# Patient Record
Sex: Female | Born: 1945 | Race: White | Hispanic: No | Marital: Single | Smoking: Former smoker
Health system: Southern US, Community
[De-identification: ages and names within clinical notes are randomized; demographics above are authoritative.]

## PROBLEM LIST (undated history)

## (undated) DIAGNOSIS — R112 Nausea with vomiting, unspecified: Secondary | ICD-10-CM

## (undated) DIAGNOSIS — F32A Depression, unspecified: Secondary | ICD-10-CM

## (undated) DIAGNOSIS — T8859XA Other complications of anesthesia, initial encounter: Secondary | ICD-10-CM

## (undated) DIAGNOSIS — F329 Major depressive disorder, single episode, unspecified: Secondary | ICD-10-CM

## (undated) DIAGNOSIS — I2699 Other pulmonary embolism without acute cor pulmonale: Secondary | ICD-10-CM

## (undated) DIAGNOSIS — E785 Hyperlipidemia, unspecified: Secondary | ICD-10-CM

## (undated) DIAGNOSIS — R011 Cardiac murmur, unspecified: Secondary | ICD-10-CM

## (undated) DIAGNOSIS — H409 Unspecified glaucoma: Secondary | ICD-10-CM

## (undated) DIAGNOSIS — I10 Essential (primary) hypertension: Secondary | ICD-10-CM

## (undated) DIAGNOSIS — I421 Obstructive hypertrophic cardiomyopathy: Secondary | ICD-10-CM

## (undated) DIAGNOSIS — T4145XA Adverse effect of unspecified anesthetic, initial encounter: Secondary | ICD-10-CM

## (undated) DIAGNOSIS — Z9889 Other specified postprocedural states: Secondary | ICD-10-CM

## (undated) HISTORY — PX: BACK SURGERY: SHX140

## (undated) HISTORY — PX: ABDOMINAL AORTIC ANEURYSM REPAIR: SUR1152

## (undated) HISTORY — DX: Essential (primary) hypertension: I10

## (undated) HISTORY — PX: NASAL SEPTUM SURGERY: SHX37

## (undated) HISTORY — PX: KNEE SURGERY: SHX244

## (undated) HISTORY — DX: Other pulmonary embolism without acute cor pulmonale: I26.99

## (undated) HISTORY — DX: Cardiac murmur, unspecified: R01.1

## (undated) HISTORY — DX: Depression, unspecified: F32.A

## (undated) HISTORY — PX: ANKLE SURGERY: SHX546

## (undated) HISTORY — DX: Hyperlipidemia, unspecified: E78.5

## (undated) HISTORY — DX: Unspecified glaucoma: H40.9

## (undated) HISTORY — DX: Major depressive disorder, single episode, unspecified: F32.9

## (undated) HISTORY — PX: DG THUMB LEFT HAND: HXRAD1658

## (undated) HISTORY — PX: CARDIAC CATHETERIZATION: SHX172

## (undated) HISTORY — DX: Obstructive hypertrophic cardiomyopathy: I42.1

---

## 1969-05-03 DIAGNOSIS — I2699 Other pulmonary embolism without acute cor pulmonale: Secondary | ICD-10-CM

## 1969-05-03 HISTORY — DX: Other pulmonary embolism without acute cor pulmonale: I26.99

## 1998-01-01 ENCOUNTER — Ambulatory Visit (HOSPITAL_COMMUNITY): Admission: RE | Admit: 1998-01-01 | Discharge: 1998-01-01 | Payer: Self-pay | Admitting: Cardiology

## 1998-03-06 ENCOUNTER — Other Ambulatory Visit: Admission: RE | Admit: 1998-03-06 | Discharge: 1998-03-06 | Payer: Self-pay | Admitting: *Deleted

## 1999-03-17 ENCOUNTER — Other Ambulatory Visit: Admission: RE | Admit: 1999-03-17 | Discharge: 1999-03-17 | Payer: Self-pay | Admitting: *Deleted

## 2000-04-19 ENCOUNTER — Other Ambulatory Visit: Admission: RE | Admit: 2000-04-19 | Discharge: 2000-04-19 | Payer: Self-pay | Admitting: Internal Medicine

## 2001-05-20 ENCOUNTER — Encounter: Payer: Self-pay | Admitting: Orthopedic Surgery

## 2001-05-20 ENCOUNTER — Encounter (INDEPENDENT_AMBULATORY_CARE_PROVIDER_SITE_OTHER): Payer: Self-pay | Admitting: Specialist

## 2001-05-20 ENCOUNTER — Observation Stay (HOSPITAL_COMMUNITY): Admission: RE | Admit: 2001-05-20 | Discharge: 2001-05-21 | Payer: Self-pay | Admitting: Orthopedic Surgery

## 2001-10-06 ENCOUNTER — Encounter (INDEPENDENT_AMBULATORY_CARE_PROVIDER_SITE_OTHER): Payer: Self-pay | Admitting: Specialist

## 2001-10-06 ENCOUNTER — Ambulatory Visit (HOSPITAL_COMMUNITY): Admission: RE | Admit: 2001-10-06 | Discharge: 2001-10-06 | Payer: Self-pay | Admitting: *Deleted

## 2002-09-17 ENCOUNTER — Encounter (INDEPENDENT_AMBULATORY_CARE_PROVIDER_SITE_OTHER): Payer: Self-pay

## 2002-09-17 ENCOUNTER — Ambulatory Visit (HOSPITAL_COMMUNITY): Admission: RE | Admit: 2002-09-17 | Discharge: 2002-09-17 | Payer: Self-pay | Admitting: *Deleted

## 2003-08-06 ENCOUNTER — Other Ambulatory Visit: Admission: RE | Admit: 2003-08-06 | Discharge: 2003-08-06 | Payer: Self-pay | Admitting: Internal Medicine

## 2004-09-24 ENCOUNTER — Emergency Department (HOSPITAL_COMMUNITY): Admission: EM | Admit: 2004-09-24 | Discharge: 2004-09-24 | Payer: Self-pay | Admitting: Emergency Medicine

## 2004-10-06 ENCOUNTER — Ambulatory Visit (HOSPITAL_COMMUNITY): Admission: RE | Admit: 2004-10-06 | Discharge: 2004-10-09 | Payer: Self-pay | Admitting: Orthopedic Surgery

## 2004-12-30 ENCOUNTER — Ambulatory Visit (HOSPITAL_BASED_OUTPATIENT_CLINIC_OR_DEPARTMENT_OTHER): Admission: RE | Admit: 2004-12-30 | Discharge: 2004-12-30 | Payer: Self-pay | Admitting: Orthopedic Surgery

## 2004-12-30 ENCOUNTER — Ambulatory Visit (HOSPITAL_COMMUNITY): Admission: RE | Admit: 2004-12-30 | Discharge: 2004-12-30 | Payer: Self-pay | Admitting: Orthopedic Surgery

## 2005-03-03 ENCOUNTER — Ambulatory Visit (HOSPITAL_COMMUNITY): Admission: RE | Admit: 2005-03-03 | Discharge: 2005-03-03 | Payer: Self-pay | Admitting: Orthopedic Surgery

## 2005-03-03 ENCOUNTER — Ambulatory Visit (HOSPITAL_BASED_OUTPATIENT_CLINIC_OR_DEPARTMENT_OTHER): Admission: RE | Admit: 2005-03-03 | Discharge: 2005-03-03 | Payer: Self-pay | Admitting: Orthopedic Surgery

## 2005-03-22 ENCOUNTER — Other Ambulatory Visit: Admission: RE | Admit: 2005-03-22 | Discharge: 2005-03-22 | Payer: Self-pay | Admitting: Internal Medicine

## 2005-09-22 ENCOUNTER — Ambulatory Visit: Payer: Self-pay | Admitting: Cardiology

## 2005-09-22 ENCOUNTER — Ambulatory Visit (HOSPITAL_COMMUNITY): Admission: EM | Admit: 2005-09-22 | Discharge: 2005-09-22 | Payer: Self-pay | Admitting: Emergency Medicine

## 2005-10-04 ENCOUNTER — Ambulatory Visit: Payer: Self-pay | Admitting: Cardiology

## 2006-03-22 ENCOUNTER — Encounter: Admission: RE | Admit: 2006-03-22 | Discharge: 2006-03-22 | Payer: Self-pay

## 2007-05-16 ENCOUNTER — Other Ambulatory Visit: Admission: RE | Admit: 2007-05-16 | Discharge: 2007-05-16 | Payer: Self-pay | Admitting: Family Medicine

## 2007-07-17 ENCOUNTER — Encounter (INDEPENDENT_AMBULATORY_CARE_PROVIDER_SITE_OTHER): Payer: Self-pay | Admitting: *Deleted

## 2007-07-17 ENCOUNTER — Ambulatory Visit (HOSPITAL_COMMUNITY): Admission: RE | Admit: 2007-07-17 | Discharge: 2007-07-17 | Payer: Self-pay | Admitting: *Deleted

## 2009-07-02 ENCOUNTER — Other Ambulatory Visit: Admission: RE | Admit: 2009-07-02 | Discharge: 2009-07-02 | Payer: Self-pay | Admitting: *Deleted

## 2009-07-02 ENCOUNTER — Encounter: Payer: Self-pay | Admitting: Cardiology

## 2010-03-13 ENCOUNTER — Encounter: Payer: Self-pay | Admitting: Cardiology

## 2010-03-17 ENCOUNTER — Ambulatory Visit: Payer: Self-pay | Admitting: Cardiology

## 2010-03-17 DIAGNOSIS — F329 Major depressive disorder, single episode, unspecified: Secondary | ICD-10-CM

## 2010-03-17 DIAGNOSIS — K921 Melena: Secondary | ICD-10-CM

## 2010-03-17 DIAGNOSIS — M25549 Pain in joints of unspecified hand: Secondary | ICD-10-CM

## 2010-03-17 DIAGNOSIS — E785 Hyperlipidemia, unspecified: Secondary | ICD-10-CM | POA: Insufficient documentation

## 2010-03-17 DIAGNOSIS — I10 Essential (primary) hypertension: Secondary | ICD-10-CM

## 2010-03-17 DIAGNOSIS — M5430 Sciatica, unspecified side: Secondary | ICD-10-CM

## 2010-03-17 DIAGNOSIS — H409 Unspecified glaucoma: Secondary | ICD-10-CM | POA: Insufficient documentation

## 2010-03-17 DIAGNOSIS — M549 Dorsalgia, unspecified: Secondary | ICD-10-CM | POA: Insufficient documentation

## 2010-03-17 DIAGNOSIS — H532 Diplopia: Secondary | ICD-10-CM

## 2010-03-17 DIAGNOSIS — I059 Rheumatic mitral valve disease, unspecified: Secondary | ICD-10-CM | POA: Insufficient documentation

## 2010-03-17 DIAGNOSIS — K649 Unspecified hemorrhoids: Secondary | ICD-10-CM | POA: Insufficient documentation

## 2010-03-17 DIAGNOSIS — E559 Vitamin D deficiency, unspecified: Secondary | ICD-10-CM | POA: Insufficient documentation

## 2010-03-17 DIAGNOSIS — R079 Chest pain, unspecified: Secondary | ICD-10-CM | POA: Insufficient documentation

## 2010-03-19 ENCOUNTER — Encounter (INDEPENDENT_AMBULATORY_CARE_PROVIDER_SITE_OTHER): Payer: Self-pay | Admitting: *Deleted

## 2010-03-19 LAB — CONVERTED CEMR LAB
Chloride: 100 meq/L (ref 96–112)
GFR calc non Af Amer: 59.71 mL/min (ref >60)
Glucose, Bld: 72 mg/dL (ref 70–99)

## 2010-04-06 ENCOUNTER — Ambulatory Visit (HOSPITAL_COMMUNITY)
Admission: RE | Admit: 2010-04-06 | Discharge: 2010-04-06 | Payer: Self-pay | Source: Home / Self Care | Admitting: Cardiovascular Disease

## 2010-04-23 ENCOUNTER — Ambulatory Visit: Payer: Self-pay

## 2010-04-23 ENCOUNTER — Encounter: Payer: Self-pay | Admitting: Cardiology

## 2010-04-23 ENCOUNTER — Ambulatory Visit (HOSPITAL_COMMUNITY)
Admission: RE | Admit: 2010-04-23 | Discharge: 2010-04-23 | Payer: Self-pay | Source: Home / Self Care | Attending: Cardiology | Admitting: Cardiology

## 2010-05-07 DIAGNOSIS — K7689 Other specified diseases of liver: Secondary | ICD-10-CM | POA: Insufficient documentation

## 2010-05-12 ENCOUNTER — Encounter
Admission: RE | Admit: 2010-05-12 | Discharge: 2010-05-12 | Payer: Self-pay | Source: Home / Self Care | Attending: Cardiology | Admitting: Cardiology

## 2010-06-04 NOTE — Miscellaneous (Signed)
Summary: Orders Update  Clinical Lists Changes  Orders: Added new Referral order of Cardiac CTA (Cardiac CTA) - Signed 

## 2010-06-04 NOTE — Consult Note (Signed)
Summary: [facility] Referral Request   [facility] Referral Request   Imported By: Roderic Ovens 04/09/2010 14:39:43  _____________________________________________________________________  External Attachment:    Type:   Image     Comment:   External Document

## 2010-06-04 NOTE — Assessment & Plan Note (Signed)
Summary: np6/ chestpain. abn ekg. pt has bcbs. gd   CC:  sob .  History of Present Illness: 65 year old female for evaluation of chest pain. Cardiac catheterization in 1990s and May of 2007 showed normal coronary arteries and normal LV function. Patient states she has had occasional chest pain for years. It is substernal and described as a tightness. It began as she starts to exert herself and as she continues it resolves. It does not radiate. There is no associated symptoms. She does not have these symptoms at rest. She otherwise denies orthopnea, PND or pedal edema. There is no syncope. Because of her chest pain are asked to further evaluate.  Current Medications (verified): 1)  Hydrochlorothiazide 25 Mg Tabs (Hydrochlorothiazide) .... Take One Tablet By Mouth Daily. 2)  Effexor Xr 75 Mg Xr24h-Cap (Venlafaxine Hcl) .Marland Kitchen.. 1  Tab By Mouth Once Daily 3)  Vitamin D 1000 Unit Tabs (Cholecalciferol) .Marland Kitchen.. 1  Tab By Mouth Once Daily 4)  Timolol Maleate 0.5 % Soln (Timolol Maleate) .... As Directed 5)  Xalatan 0.005 % Soln (Latanoprost) .... As Directed 6)  Fish Oil   Oil (Fish Oil) .Marland Kitchen.. 1 Tab By Mouth Once Daily 7)  Calcium Carbonate-Vitamin D 600-400 Mg-Unit Tabs (Calcium Carbonate-Vitamin D) .Marland Kitchen.. 1 Tab By Mouth Once Daily 8)  Claritin 10 Mg Tabs (Loratadine) .Marland Kitchen.. 1 Tab By Mouth Once Daily 9)  Triamcinolone Acetonide 0.1 % Crea (Triamcinolone Acetonide) .... As Directed 10)  Aspirin 81 Mg  Tabs (Aspirin) .Marland Kitchen.. 1  Tab By Mouth Once Daily 11)  Valtrex 1 Gm Tabs (Valacyclovir Hcl) .... As Needed 12)  Melatonin 1 Mg Caps (Melatonin) .Marland Kitchen.. 1  Tab By Mouth At Bedtime  Allergies: 1)  ! Augmentin  Past History:  Past Medical History: HYPERLIPIDEMIA HYPERTENSION  UNSPECIFIED VITAMIN D DEFICIENCY  GLUCOMA HEMORRHOIDS DEPRESSION  Prior Pulmonary embolus in 1971 following surgery  Past Surgical History: History of ankle surgery Prior right knee  Family History: Reviewed history from 03/17/2010  and no changes required. Father with CABG in his 56's  Social History: Reviewed history from 03/17/2010 and no changes required.  The patient quit tobacco in 1982.  She lives alone.  She is  not married and has no children.  She likes to play softball. Alcohol Use - yes Full Time  Review of Systems       no fevers or chills, productive cough, hemoptysis, dysphasia, odynophagia, melena, hematochezia, dysuria, hematuria, rash, seizure activity, orthopnea, PND, pedal edema, claudication. Remaining systems are negative.   Vital Signs:  Patient profile:   65 year old female Height:      67 inches Weight:      241 pounds BMI:     37.88 Pulse rate:   74 / minute Resp:     14 per minute BP sitting:   140 / 70  (left arm)  Vitals Entered By: Kem Parkinson (March 17, 2010 2:30 PM)  Physical Exam  General:  Well developed/well nourished in NAD Skin warm/dry Patient not depressed No peripheral clubbing Back-normal HEENT-normal/normal eyelids Neck supple/normal carotid upstroke bilaterally; no bruits; no JVD; no thyromegaly chest - CTA/ normal expansion CV - RRR/normal S1 and S2; no  rubs or gallops;  PMI nondisplaced; 1/6 systolic ejection murmur left sternal border. Abdomen -NT/ND, no HSM, no mass, + bowel sounds, no bruit 2+ femoral pulses, no bruits Ext-no edema, chords, 2+ DP Neuro-grossly nonfocal     EKG  Procedure date:  03/13/2010  Findings:      Sinus  bradycardia at a rate of 47. Diffuse T-wave inversion. Left ventricular hypertrophy.  Impression & Recommendations:  Problem # 1:  CHEST PAIN (ICD-786.50) Symptoms hernia based on description. However have also been present for several years. Previous cardiac catheterization showed normal coronary arteries. I will proceed with a functional study. If negative no further cardiac evaluation. Her updated medication list for this problem includes:    Aspirin 81 Mg Tabs (Aspirin) .Marland Kitchen... 1  tab by mouth once  daily  Problem # 2:  HYPERLIPIDEMIA (ICD-272.4) Management per primary care.  Problem # 3:  HYPERTENSION (ICD-401.9)  Blood pressure controlled on present medications. Will continue. Her updated medication list for this problem includes:    Hydrochlorothiazide 25 Mg Tabs (Hydrochlorothiazide) .Marland Kitchen... Take one tablet by mouth daily.    Aspirin 81 Mg Tabs (Aspirin) .Marland Kitchen... 1  tab by mouth once daily  Orders: TLB-BMP (Basic Metabolic Panel-BMET) (80048-METABOL)

## 2010-06-04 NOTE — Letter (Signed)
Summary: Susan Jenkins Physicians Office Visit Note   [facility] Physicians Office Visit Note   Imported By: Roderic Ovens 04/09/2010 14:34:24  _____________________________________________________________________  External Attachment:    Type:   Image     Comment:   External Document

## 2010-06-11 ENCOUNTER — Encounter: Payer: Self-pay | Admitting: Cardiology

## 2010-06-11 ENCOUNTER — Ambulatory Visit (INDEPENDENT_AMBULATORY_CARE_PROVIDER_SITE_OTHER): Payer: BC Managed Care – PPO | Admitting: Cardiology

## 2010-06-11 DIAGNOSIS — I422 Other hypertrophic cardiomyopathy: Secondary | ICD-10-CM | POA: Insufficient documentation

## 2010-06-11 DIAGNOSIS — I421 Obstructive hypertrophic cardiomyopathy: Secondary | ICD-10-CM

## 2010-06-18 NOTE — Assessment & Plan Note (Signed)
Summary: f6w/rescheduled from bmp list/dm   History of Present Illness: 65 year old female I saw in Nov 2011 for evaluation of chest pain. Cardiac catheterization in 1990s and May of 2007 showed normal coronary arteries and normal LV function. Patient states she has had occasional chest pain for years. Cardiac CT in December of 2011 revealed a calcium score of zero.  Normal right dominant coronary arteries. No significant lung or soft tissue findings. Abnormal LV apex.  Suggest correlation with echo or MRI. 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 Small pericardial effusion. Followup echocardiogram in December of 2011 revealed normal LV function, trace aortic insufficiency, and mild left atrial enlargement. Since I last saw her, the patient denies any dyspnea on exertion, orthopnea, PND, pedal edema, palpitations, syncope or chest pain.    Current Medications (verified): 1)  Hydrochlorothiazide 25 Mg Tabs (Hydrochlorothiazide) .... Take One Tablet By Mouth Daily. 2)  Effexor Xr 75 Mg Xr24h-Cap (Venlafaxine Hcl) .Marland Kitchen.. 1  Tab By Mouth Once Daily 3)  Vitamin D 1000 Unit Tabs (Cholecalciferol) .Marland Kitchen.. 1  Tab By Mouth Once Daily 4)  Timolol Maleate 0.5 % Soln (Timolol Maleate) .... As Directed 5)  Xalatan 0.005 % Soln (Latanoprost) .... As Directed 6)  Fish Oil   Oil (Fish Oil) .Marland Kitchen.. 1 Tab By Mouth Once Daily 7)  Calcium Carbonate-Vitamin D 600-400 Mg-Unit Tabs (Calcium Carbonate-Vitamin D) .Marland Kitchen.. 1 Tab By Mouth Once Daily 8)  Claritin 10 Mg Tabs (Loratadine) .Marland Kitchen.. 1 Tab By Mouth Once Daily 9)  Triamcinolone Acetonide 0.1 % Crea (Triamcinolone Acetonide) .... As Directed 10)  Aspirin 81 Mg  Tabs (Aspirin) .Marland Kitchen.. 1  Tab By Mouth Once Daily 11)  Valtrex 1 Gm Tabs (Valacyclovir Hcl) .... As Needed 12)  Melatonin 1 Mg Caps (Melatonin) .Marland Kitchen.. 1  Tab By Mouth At Bedtime  Allergies: 1)  ! 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  Past Surgical History: Reviewed history from 03/17/2010 and no changes required. History of ankle surgery Prior right knee  Social History: Reviewed history from 03/17/2010 and no changes required.  The patient quit tobacco in 1982.  She lives alone.  She is  not married and has no children.  She likes to play softball. Alcohol Use - yes Full Time  Review of Systems       no fevers or chills, productive cough, hemoptysis, dysphasia, odynophagia, melena, hematochezia, dysuria, hematuria, rash, seizure activity, orthopnea, PND, pedal edema, claudication. Remaining systems are negative.   Vital Signs:  Patient profile:   65 year old female Height:      67 inches Weight:      238 pounds BMI:     37.41 Pulse rate:   49 / minute Resp:     14 per minute BP sitting:   130 / 75  (left arm)  Vitals Entered By: Kem Parkinson (June 11, 2010 12:29 PM)  Physical Exam  General:  Well-developed well-nourished in no acute distress.  Skin is warm and dry.  HEENT is normal.  Neck is supple. No thyromegaly.  Chest is clear to auscultation with normal expansion.  Cardiovascular exam is regular rate and rhythm.  Abdominal exam nontender or distended. No masses palpated. Extremities show no edema. 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 However there are no symptoms including no dyspnea. She has no history of syncope. There is no family history of sudden death. I will arrange a 24-hour Holter monitor to exclude nonsustained ventricular tachycardia. I will not add any medications as she is asymptomatic.  She will have her siblings screened.  She has no children. Her updated medication list for this problem includes:    Hydrochlorothiazide 25 Mg Tabs (Hydrochlorothiazide) .Marland Kitchen... Take one tablet by mouth daily.    Aspirin 81 Mg Tabs (Aspirin) .Marland Kitchen... 1  tab by mouth once daily  Her updated medication list for this problem includes:    Hydrochlorothiazide 25 Mg Tabs (Hydrochlorothiazide) .Marland Kitchen... Take one tablet by mouth daily.    Aspirin 81 Mg Tabs (Aspirin) .Marland Kitchen... 1  tab by mouth once daily  Problem # 2:  CHEST PAIN (ICD-786.50) No evidence of obstructive coronary disease. Her updated medication list for this problem includes:    Aspirin 81 Mg Tabs (Aspirin) .Marland Kitchen... 1  tab by mouth once daily  Problem # 3:  HYPERTENSION (ICD-401.9) Blood pressure controlled. Continue present medications. Her updated medication list for this problem includes:    Hydrochlorothiazide 25 Mg Tabs (Hydrochlorothiazide) .Marland Kitchen... Take one tablet by mouth daily.    Aspirin 81 Mg Tabs (Aspirin) .Marland Kitchen... 1  tab by mouth once daily  Problem # 4:  HYPERLIPIDEMIA (ICD-272.4) Management per primary care.  Patient Instructions: 1)  Your physician wants you to follow-up in:  ONE YEAR You will receive a reminder letter in the mail two months in advance. If you don't receive a letter, please call our office to schedule the follow-up appointment. 2)  Your physician has recommended that you wear a holter monitor.  Holter monitors are medical devices that record the heart's electrical activity. Doctors most often use these monitors to diagnose arrhythmias. Arrhythmias are problems with the speed or rhythm of the heartbeat. The monitor is a small, portable device. You can wear one while you do your normal daily activities. This is usually used to diagnose what is causing palpitations/syncope (passing out).CALL ME TO SCHEDULE

## 2010-09-15 NOTE — Op Note (Signed)
NAMEBARBA, Susan Jenkins                ACCOUNT NO.:  [id number]   MEDICAL RECORD NO.:  [id number]          PATIENT TYPE:  AMB   LOCATION:  ENDO                         FACILITY:  [facility]   PHYSICIAN:  Georgiana Spinner, M.D.    DATE OF BIRTH:  Feb 22, 1946   DATE OF PROCEDURE:  07/17/2007  DATE OF DISCHARGE:                               OPERATIVE REPORT   PROCEDURE:  Endoscopy.   ANESTHESIA:  Fentanyl 50 mcg, Versed 5 mg.   INDICATIONS:  Gastroesophageal reflux disease.   PROCEDURE IN DETAIL:  With the patient mildly sedated in the left  lateral decubitus position the Pentax videoscopic endoscope was inserted  in the mouth and passed under direct vision through the esophagus into  the stomach.  The fundus, body, antrum, duodenal bulb, second portion of  duodenum were visualized.  From this point the endoscope was slowly  withdrawn taking circumferential views of duodenal mucosa until the  endoscope had been pulled back into the stomach, placed in retroflexion  to view the stomach from below.  The endoscope was straightened and  withdrawn taking circumferential views of remaining gastric and  esophageal mucosa stopping in the fundus where changes of thickening of  the mucosa were noted and photographed and biopsied to rule out  gastritis.  The endoscope was pulled back to the distal esophagus where  there was one area of possibly Barrett's esophagus not well seen but we  photographed this area and biopsied it.  The endoscope was withdrawn  taking circumferential views of remaining gastric and esophageal mucosa.  The patient's vital signs, pulse oximetry remained stable.  The patient  tolerated the procedure well without apparent complications.   FINDINGS:  Changes of gastritis, biopsied.  Question of Barrett's  esophagus, biopsied.  Await biopsy report.  The patient will call me for  results and follow up with me as an outpatient.           ______________________________  Georgiana Spinner,  M.D.     GMO/MEDQ  D:  07/17/2007  T:  07/17/2007  Job:  [id number]   cc:   Miss Fredrich Birks, NP  Deboraha Sprang

## 2010-09-15 NOTE — Op Note (Signed)
NAMEJENINE, Susan Jenkins                ACCOUNT NO.:  [id number]   MEDICAL RECORD NO.:  [id number]          PATIENT TYPE:  AMB   LOCATION:  ENDO                         FACILITY:  [facility]   PHYSICIAN:  Georgiana Spinner, M.D.    DATE OF BIRTH:  [birth year]   DATE OF PROCEDURE:  DATE OF DISCHARGE:                               OPERATIVE REPORT   ANESTHESIA:  Fentanyl 50 mcg, Versed 5 mg.   PROCEDURE:  With the patient mildly sedated in the left lateral  decubitus position the Pentax videoscopic colonoscope was inserted in  the rectum and passed under direct vision with pressure applied we were  able to reach the cecum identified by ileocecal valve and appendiceal  orifice both of which were photographed from this point the colonoscope  was slowly withdrawn taking circumferential views of colonic mucosa  stopping to photograph diverticulosis seen in the sigmoid colon to we  reached 20 cm from anal verge at which point a small polyp was seen  photographed and removed using hot biopsy forceps technique setting of  20/150 blended current in the rectum.  The endoscope was placed in  retroflexed view to view the anal canal from above the endoscope was  straightened and withdrawn the patient's vital signs, pulse oximeter  remained stable.  The patient tolerated procedure well without apparent  complication.   FINDINGS:  Internal hemorrhoids moderate diverticulosis of sigmoid colon  polyp at 20 cm from anal verge.  Plan await biopsy report.  The patient  will call me for results and follow-up with me as needed as an  outpatient.           ______________________________  Georgiana Spinner, M.D.     GMO/MEDQ  D:  07/17/2007  T:  07/17/2007  Job:  [id number]

## 2010-09-18 NOTE — Op Note (Signed)
[facility]  Patient:    Susan Jenkins, Susan Jenkins Visit Number: [id number] MRN: [id number]          Service Type: DSU Location: DAY Attending Physician:  Marin Comment Dictated by:   Pershing Cox, M.D. Proc. Date: 10/06/01 Admit Date:  10/06/2001   CC:         Darius Bump, M.D.   Operative Report  PREOPERATIVE DIAGNOSES: 1. Postmenopausal bleeding. 2. Abnormal sonogram.  POSTOPERATIVE DIAGNOSIS:  Endometrial polyps.  ANESTHESIA:  MAC plus Marcaine paracervical block.  SURGEON:  Pershing Cox, M.D.  INDICATIONS FOR PROCEDURE:  The patient is a 65 year old menopausal woman, who stopped having menstrual periods five years ago.  She has been spotting off and on for approximately five years.  She had a small cervical polyp noted by her family physician, Dr. Madison Hickman.  Given her history of extended bleeding, I recommended a sonogram which was performed on August 10, 2001. This showed thickened endometrium consistent with endometrial polyps.  The patient is brought to the operating room today for [facility] hysteroscopy and resection if necessary.  FINDINGS:  Exam under anesthesia shows the uterus to be small.  There are no adnexal masses noted.  The uterine cavity sounds to 7 cm.  There was a moderate amount of cervical stenosis which was overcome by dilators.  The canal was mostly atrophic.  There were two large polyps lying on the posterior wall of the uterus, arising from the lower uterine segment.  Both ostia were visualized.  DESCRIPTION OF PROCEDURE:  The patient, Susan Jenkins was brought to the operating room with an IV in place.  She received 1 g of Ancef in the holding area.  Supine on the OR table, IV sedation was administered, and then she was placed into Allen stirrups.  Exam under anesthesia was performed. She was then prepped with a solution of Hibiclens and draped with a sterile collecting drape beneath her  buttocks for a sterile vaginal procedure.  Bivalve speculum was inserted into the vagina.  Cervix was visualized, and Marcaine was injected into the anterior cervix which was grasped with a single-tooth tenaculum.  Marcaine paracervical block was then administered at the 3, 4, 7, and 8 positions using a total volume of 20 cc of this 0.25% solution.  The Kevorkian curette was used to obtain endocervical curettings.  Endocervical sound then passed into the endometrial cavity to stated distance of 7 cm. Serial Pratt dilators were then used to dilate the cervix to size 25.  The diagnostic scope was introduced, and photographs were taken to document the presence of these polyps.  The cervix was then further dilated with [facility] dilators to size 31.  It should be noted that cervical stenosis was significant and, in order to be able to complete the dilatation, it was necessary to place a second tenaculum on the posterior cervix.  Using slow, steady pressure, I was able to dilate to a size 33.  The resectoscope was then introduced, and we were able to complete the operation.  Using sorbitol through-and-through irrigation, the cavity was appropriately distended.  The resectoscope with a right angle wire was used to resect the polyps.  Our settings were 110-110 blend 1.  Both of the polyps were resected by carving through their base and then removing the fragments in toto.  The base of the polyps was resected and then cauterized.  The remainder of the cavity was irrigated and felt to be clean.  Small, sharp  curette was introduced into the uterine cavity, and endometrial curettings were scant.  These were submitted with the polyps.  Uterine sound was re-placed and sounded to only 7 cm.  The patient tolerated the procedure well and was taken to the recovery room in good condition. Dictated by:   Pershing Cox, M.D. Attending Physician:  Marin Comment DD:  10/06/01 TD:  10/07/01 Job:  [id number]

## 2010-09-18 NOTE — Op Note (Signed)
[facility]  Patient:    Susan Jenkins Visit Number: [id number] MRN: [id number]          Service Type: Attending:  Illene Labrador. Aplington, M.D. Dictated by:   Illene Labrador. Aplington, M.D. Proc. 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  OPERATION:  Microdiskectomy L4-L5 left with excision of herniated nucleus pulposus and removal of large free fragment of disk material.  SURGEON:  Illene Labrador. Aplington, M.D.  ASSISTANT:  Philips J. Montez Morita, M.D.  ANESTHESIA:  General.  INDICATIONS:  She was simply raking leaves in her yard on April 24, 2001, when she developed low back and left lower extremity pain.  This became progressively more severe with numbness and over the last few days some weakness and dorsiflexion of her left foot.  A lumbar MRI on May 13, 2001, demonstrated a large free fragment of disk material behind the body of L5 as well as HNP, and accordingly she is here for the above mentioned surgery. The risks and complications were discussed with her preoperatively.  DESCRIPTION OF PROCEDURE:  Prophylactic antibiotics, general anesthesia.  The patient was positioned on the [place] frame.  Back was prepped with DuraPrep and with three spinal needles and lateral x-ray, I tentatively localized the L4-L5 interspace.  I then continued draping the back in a sterile field, Ioban employed.  A vertical midline incision based on the original films.  The two spinous processes at the level which I thought were L4 and L5 were tacked with Kocher clamps and lateral x-rays confirmed that these Kochers were on the spinous process of L4 and L5.  Based on this, I then dissected the soft tissue off the lamina of L4 and L5 and placed a self  retaining retractor.  Then with a combination of 2 and 3 mm Kerrison rongeurs and removing a little bit of the inferior lamina of the lamina of L4 with the double action rongeur I gradually worked my way around the bony parameters at L4-L5 undermining the superior portion of L5 with a small curet for assistance.  I decompressed the dura and the L5 nerve root and removed ligament of flavum.  When we had enough working room I brought in the microscope and completed the lateral decompression.  The L5 nerve root was identified.  Lying beneath it was a large disk fragment and just superior and cephalad were two large veins.  I first gently dissected off the L5 nerve root from the underlying large disk fragment with a Penfield 4. I then removed it with pituitary rongeur.  This thoroughly decompressed the L5 nerve root.  A little additional fragments of nerve root were also removed from beneath the nerve with a nerve hook.  The two large veins were then coagulated with bipolar cautery.  They were overlying the L4-L5 disk which I then opened with a 15 knife blade and removed a large amount of disk material with a good bit of it centrally with a combination of straight and upbiting pituitaries.  At the conclusion of the case, all disk material had been removed from the interspace.  With both nerve hook and hockey stick, I was unable to find any  additional disk material beneath the dura or the L5 nerve root or the foramen, all of which appeared to be thoroughly decompressed.  The L3-L4 foramen was also patent to hockey stick.  The wound was then irrigated with sterile saline.  There was no unusual bleeding.  Gelfoam was placed over the inner space and around the L5 nerve root in the dura.  Self-retaining retractors were removed.  There is no unusual bleeding.  She was given 30 mg of Toradol IV.  The fascia was closed with interrupted #1 Vicryl, subcutaneous tissue with 2-0 Vicryl infiltrated with 0.5%  plain Marcaine.  The skin was closed with small staples.  Betadine, Adaptic and dry sterile dressing were applied.  She tolerated the procedure well.  She was gently placed on her bed. At the time of this dictation, she was on her way to recovery in satisfactory condition with no known complications. Dictated by:   Illene Labrador. Aplington, M.D. Attending:  Illene Labrador. Aplington, M.D. DD:  05/20/01 TD:  05/22/01 Job: [id number]

## 2010-09-18 NOTE — Op Note (Signed)
Susan Jenkins, Susan Jenkins                ACCOUNT NO.:  [id number]   MEDICAL RECORD NO.:  [id number]          PATIENT TYPE:  AMB   LOCATION:  NESC                         FACILITY:  [facility]   PHYSICIAN:  Marlowe Kays, M.D.  DATE OF BIRTH:  [birth year]   DATE OF PROCEDURE:  12/30/2004  DATE OF DISCHARGE:                                 OPERATIVE REPORT   PREOPERATIVE DIAGNOSES:  Status post repair of deltoid ligament, distal  fibular fracture, and placement of syndesmosis screw, left ankle.   POSTOPERATIVE DIAGNOSIS:  Status post repair of deltoid ligament, distal  fibular fracture, and placement of syndesmosis screw, left ankle.   OPERATIONS:  Screw transfer removing syndesmosis screw and replacement with  regular bone cortical screw in distal fibula.   SURGEON:  Marlowe Kays, M.D.   ASSISTANT:  Nurse.   ANESTHESIA:  General.   JUSTIFICATION FOR PROCEDURE:  She is now roughly 12 weeks post original  surgery and is time to let her begin weightbearing following the screw  exchange.   PROCEDURE:  Satisfied with general anesthesia with pneumatic tourniquet, the  leg was Esmarched out nonsterilely, prepped from mid-calf to toes with  DuraPrep and draped in a sterile field.  With a mini C-arm, I localize the  area of the syndesmosis screw and made a small incision there.  The screw  location was confirmed by placing the screwdriver in the screw head, and I  then removed the screw, measured the replacement screw at 20 mm, placed a  cortical screw, took the follow-up x-ray, and then closed following  irrigation  The subcutaneous tissue was closed with 3-0 Vicryl, the skin  with interrupted 4-0 nylon mattress sutures.  An Adaptic dry sterile  dressing was applied. The tourniquet was released. She tolerated the  procedure well and was taken to the recovery room in satisfactory condition  without no known complications. I also did infiltrate the soft tissues with  0.5% plain  Marcaine.           ______________________________  Marlowe Kays, M.D.     JA/MEDQ  D:  12/30/2004  T:  12/30/2004  Job:  [id number]

## 2010-09-18 NOTE — Op Note (Signed)
[name], [name]                ACCOUNT NO.:  [id number]   MEDICAL RECORD NO.:  [id number]          PATIENT TYPE:  AMB   LOCATION:  NESC                         FACILITY:  [facility]   PHYSICIAN:  Marlowe Kays, M.D.  DATE OF BIRTH:  [birth year]   DATE OF PROCEDURE:  03/03/2005  DATE OF DISCHARGE:                                 OPERATIVE REPORT   PREOPERATIVE DIAGNOSES:  Painful distal fibular plate, left ankle.   POSTOPERATIVE DIAGNOSES:  Painful distal fibular plate, left ankle.   OPERATION:  Removal of DCP distal left fibula.   SURGEON:  Marlowe Kays, M.D.   ASSISTANT:  Nurse.   ANESTHESIA:  General.   PATHOLOGY AND JUSTIFICATION FOR PROCEDURE:  She had had complex ankle  surgery. The fracture of the distal fibula is healed but she is having pain  which apparently is due to the plate extending down to the tip of the fibula  which I had to extend that far to cover the fracture site preoperatively. No  evidence for an infection, fracture is well-healed.   PROCEDURE:  Satisfactory general anesthesia, left lower extremity was  esmarched out nonsterilely and prepped from mid calf to toes with DuraPrep  and draped in a sterile field. I went through the old surgical incision. The  superficial cutaneous nerve was noted just superior and protected. The plate  was identified, once again no infection was noted. The six screws and plate  were removed and saved for the patient. The wound was irrigated with sterile  saline and the subcutaneous tissue infiltrated 1/2% Marcaine with  adrenaline. I then carefully closed the wound avoiding the sensory nerve  with interrupted 2-0 Vicryl deep, 3-0 Vicryl superficially in the  subcutaneous tissue and interrupted 4-0 nylon mattress sutures in the skin.  Betadine Adaptic dry sterile dressing were applied, tourniquet was released.  She tolerated the procedure well and was taken to the recovery room in  satisfactory condition with no known  complications.           ______________________________  Marlowe Kays, M.D.     JA/MEDQ  D:  03/03/2005  T:  03/04/2005  Job:  [id number]

## 2010-09-18 NOTE — Op Note (Signed)
   NAME:  Susan Jenkins, Susan Jenkins                          ACCOUNT NO.:  [id number]   MEDICAL RECORD NO.:  [id number]                   PATIENT TYPE:  AMB   LOCATION:  ENDO                                 FACILITY:  [facility]   PHYSICIAN:  Georgiana Spinner, M.D.                 DATE OF BIRTH:  Mar 29, 1946   DATE OF PROCEDURE:  09/17/2002  DATE OF DISCHARGE:                                 OPERATIVE REPORT   PROCEDURE:  Upper endoscopy.   INDICATIONS FOR PROCEDURE:  Gastroesophageal reflux disease.   ANESTHESIA:  Demerol 80, Versed 9 mg.   DESCRIPTION OF PROCEDURE:  With the patient mildly sedated in the left  lateral decubitus position, the Olympus videoscopic endoscope was inserted  in the mouth and passed under direct vision through the esophagus and there  was a questionable area of Barrett's esophagus seen, photographed and  biopsied as best we could. We entered into the stomach, fundus, body,  antrum, duodenal bulb and second portion of the duodenum all appeared  normal. From this point, the endoscope was slowly withdrawn taking  circumferential views of the duodenal mucosa until the endoscope was then  pulled back into the stomach, placed in retroflexion to view the stomach  from below. The endoscope was then straightened and withdrawn taking  circumferential views of the remaining gastric and esophageal mucosa. The  patient's vital signs and pulse oximeter remained stable. The patient  tolerated the procedure well without apparent complications.   FINDINGS:  Question of short segment Barrett's esophagus biopsied. Await  biopsy report. The patient will call me for results and followup with me as  an outpatient. Proceed to colonoscopy as planned.                                               Georgiana Spinner, M.D.    GMO/MEDQ  D:  09/17/2002  T:  09/17/2002  Job:  [id number]   cc:   Darius Bump, M.D.  [address]  [place] [address]  Fax: [phone]

## 2010-09-18 NOTE — Op Note (Signed)
   NAME:  Susan Jenkins, Susan Jenkins                          ACCOUNT NO.:  [id number]   MEDICAL RECORD NO.:  [id number]                   PATIENT TYPE:  AMB   LOCATION:  ENDO                                 FACILITY:  [facility]   PHYSICIAN:  Georgiana Spinner, M.D.                 DATE OF BIRTH:  10-Jan-1946   DATE OF PROCEDURE:  09/17/2002  DATE OF DISCHARGE:                                 OPERATIVE REPORT   PROCEDURE:  Colonoscopy.   INDICATIONS:  Rectal bleeding.   ANESTHESIA:  None further given.   DESCRIPTION OF PROCEDURE:  With the patient mildly sedated in the left  lateral decubitus position, the Olympus videoscopic colonoscope was inserted  in the rectum and passed under direct vision to the cecum, identified by the  crow's foot of the cecum and the ileocecal valve, both of which were  photographed.  From this point the colonoscope was slowly withdrawn, taking  circumferential views of the entire colonic mucosa, stopping only in the  rectum, which appeared normal on direct and showed hemorrhoids on  retroflexed view.  The endoscope was straightened and withdrawn.  The  patient's vital signs and pulse oximetry remained stable.  The patient  tolerated the procedure well without apparent complications.   FINDINGS:  Internal hemorrhoids, otherwise unremarkable colonoscopic  examination to the cecum.  This very well may account for the patient's  described hematochezia.   PLAN:  Have the patient follow up with me as an outpatient as described in  the endoscopy note.                                               Georgiana Spinner, M.D.    GMO/MEDQ  D:  09/17/2002  T:  09/17/2002  Job:  [id number]   cc:   Darius Bump, M.D.  [address]  [place] [address]  Fax: [phone]

## 2010-09-18 NOTE — H&P (Signed)
NAMEALBANA, Susan Jenkins NO.:  [id number]   MEDICAL RECORD NO.:  [id number]          PATIENT TYPE:  EMS   LOCATION:  MAJO                         FACILITY:  [facility]   PHYSICIAN:  Rollene Rotunda, M.D.   DATE OF BIRTH:  10/08/1945   DATE OF ADMISSION:  09/22/2005  DATE OF DISCHARGE:                                HISTORY & PHYSICAL   PRIMARY:  Dr. Madison Hickman   REASON FOR PRESENTATION:  Evaluate patient with chest pain.   HISTORY OF PRESENT ILLNESS:  The patient is a pleasant 65 year old white  female with a past history of a cardiac catheterization many years ago.  She  reports that to be normal.  She presented to her primary care doctor for  routine appointment but incidentally had been having chest discomfort.  It  has been going on for about 2-3 months.  The chest discomfort is left-sided.  It occurs routinely now with exertion or with emotional stress.  It is 6/10  in intensity.  She was not having this prior.  It is sharp.  It is  associated with shortness of breath.  It radiates down into her left arm.  When she gets it with walking she can sometimes walk through it.  However,  she notices that it is coming on with more frequency and with less activity  and with more intensity.  She had a severe episode this past Sunday.  In Dr.  Ermalene Searing office she was pain-free but noted to have an EKG with deep T-wave  inversions in the anterolateral leads.  This was much more pronounced than  on a previous EKG in 2006.   PAST MEDICAL HISTORY:  1.  Hyperlipidemia.  2.  Hypertension.   PAST SURGICAL HISTORY:  Ankle surgery.   ALLERGIES:  AUGMENTIN.   MEDICATIONS:  1.  Effexor 75 mg a day.  2.  Vytorin 10/40 one daily.   SOCIAL HISTORY:  The patient quit tobacco in 1982.  She lives alone.  She is  not married and has no children.  She likes to play softball.   FAMILY HISTORY:  Contributory for her father having early heart disease and  dying at age 64 of  myocardial infarction.   REVIEW OF SYSTEMS:  Positive for constipation, hemorrhoids.  Negative for  all other systems except as stated in the HPI.   PHYSICAL EXAMINATION:  GENERAL:  The patient is pleasant and in no distress.  VITAL SIGNS:  Afebrile, heart rate 54 and regular, blood pressure 152/79,  pulse oximetry 98% on room air.  HEENT:  Eyes unremarkable.  Pupils equal, round, and react to light.  Fundi  within normal limits. Oral mucosa unremarkable.  NECK:  No jugular venous distention.  Waveform within normal limits.  Carotid upstroke brisk and symmetric.  No bruits, no thyromegaly.  LYMPHATICS:  No cervical, axillary, or inguinal adenopathy.  LUNGS:  Clear to auscultation bilaterally.  BACK:  No costovertebral angle tenderness.  CHEST:  Unremarkable.  HEART:  PMI not displaced or sustained.  S1 and S2 within normal limits, no  S3, positive S4, no  murmurs.  ABDOMEN:  Mildly obese, positive bowel sounds normal in frequency and pitch.  No rebounds, rebound, guarding, midline pulsatile mass.  No hepatomegaly,  splenomegaly.  SKIN:  No rashes, no nodules.  EXTREMITIES:  Show 2+ pulses throughout.  No edema, cyanosis, or clubbing.  NEUROLOGIC:  Oriented to person, place and time.  Cranial nerves II-XII  grossly intact.  Motor grossly intact.   LABORATORY DATA:  Sodium 140, potassium 3.9, BUN 12, creatinine 0.9.  WBC  5.8, hemoglobin 12.4.   EKG:  Sinus rhythm, rate 55, axis within normal limits, intervals within  normal limits, lateral T-wave inversions consistent with ischemia versus  repolarization.   ASSESSMENT AND PLAN:  1.  Chest discomfort.  The patient's chest discomfort has some very      worrisome features for unstable angina.  She does have risk factors.      She has an abnormal EKG.  Given this, her pretest probability of      obstructive coronary disease is moderately high.  We discussed the      options of cardiac catheterization.  I think a false negative rate  is      too high in this situation.  I think the most definitive test in this      situation is a cardiac catheterization.  The patient has had one before.      She understands the procedure and understands the risks and benefits and      agrees to proceed.  2.  Hypertension.  Her EKG would suggest that this might be LVH with      repolarization.  She does have an S4.  She was taken off of      hydrochlorothiazide I believe in the past because of electrolyte      abnormalities.  I will take the liberty of staring an antihypertensive      here with probably a second-generation calcium channel blocker.  3.  Risk reduction.  She is having her lipids followed by Dr. Daphine Deutscher.  The      goal LDL will be based on presence or absence of obstructive coronary      disease.           ______________________________  Rollene Rotunda, M.D.     JH/MEDQ  D:  09/22/2005  T:  09/22/2005  Job:  [id number]   cc:   Darius Bump, M.D.  Fax: [phone]

## 2010-09-18 NOTE — Discharge Summary (Signed)
Susan Jenkins, Susan Jenkins                ACCOUNT NO.:  [id number]   MEDICAL RECORD NO.:  [id number]          PATIENT TYPE:  INP   LOCATION:  2807                         FACILITY:  [facility]   PHYSICIAN:  Rollene Rotunda, M.D.   DATE OF BIRTH:  1946/02/12   DATE OF ADMISSION:  09/22/2005  DATE OF DISCHARGE:  09/22/2005                                 DISCHARGE SUMMARY   PRIMARY CARDIOLOGIST:  Rollene Rotunda, M.D.   PRIMARY CARE PHYSICIAN:  Theatre stage manager at [facility].   PRINCIPAL DIAGNOSIS:  Chest pain.   OTHER DIAGNOSES:  1.  Hypertension.  2.  Hyperlipidemia.  3.  History of ankle surgery.  4.  Depression.   ALLERGIES:  AUGMENTIN.   PROCEDURES:  Left heart cardiac catheterization.   HISTORY OF PRESENT ILLNESS:  A 65 year old white female with no prior  history of CAD who reports 2-27-month history of exertional chest pain with  radiation down the left arm associated with shortness of breath and  diaphoresis which has been progressively worse in intensity and more  frequent.  She saw her primary care physician earlier today; and was advised  to [facility] ED for further evaluation.  In the ED, she had  ST-segment depression in the I, II, III, aVF and V3-V6.  A decision was made  to pursue cardiac catheterization.   HOSPITAL COURSE:  She underwent left heart cardiac catheterization, this  afternoon, which revealed normal coronary arteries with an EF of 65% and  normal wall motion.  She was hypertensive throughout the case with a  pressure of 151/94; and she has been initiated on Norvasc and amlodipine  therapy.  Post catheterization she has not had any additional discomfort.  She is being discharged home, today, in satisfactory condition.   DISCHARGE LABS:  Hemoglobin 12.4, hematocrit 35.7, WBC 5.8, platelets 219.  Sodium 140, potassium 3.9, chloride 109, BUN 12, creatinine 0.9, glucose 87.  CK 338, MB 3.8, troponin I 0.07.   DISPOSITION:  The patient is being  discharged home today in good condition.   FOLLOWUP APPOINTMENTS:  She has follow up appointment with Dr. Antoine Poche on  June 4 at 9:30 a.m.   DISCHARGE MEDICATIONS:  1.  Aspirin 81 mg daily.  2.  Effexor XR 75 mg daily.  3.  Vytorin 10/10 mg q.h.s.  4.  Norvasc 5 mg daily.   __________ discharge encounter 35 minutes including physician time.      Ok Anis, NP    ______________________________  Rollene Rotunda, M.D.    CRB/MEDQ  D:  09/22/2005  T:  09/23/2005  Job:  [id number]   cc:   [facility] at [facility]

## 2010-09-18 NOTE — Op Note (Signed)
Susan Jenkins, Susan Jenkins                ACCOUNT NO.:  [id number]   MEDICAL RECORD NO.:  [id number]          PATIENT TYPE:  OIB   LOCATION:  5005                         FACILITY:  [facility]   PHYSICIAN:  Marlowe Kays, M.D.  DATE OF BIRTH:  12-21-1945   DATE OF PROCEDURE:  10/06/2004  DATE OF DISCHARGE:                                 OPERATIVE REPORT   PREOPERATIVE DIAGNOSES:  1.  Closed fracture distal left fibula.  2.  Tear of deltoid ligament.  3.  Disruption of ankle mortise left ankle.   POSTOPERATIVE DIAGNOSES:  1.  Closed fracture distal left fibula.  2.  Tear of deltoid ligament.  3.  Disruption of ankle mortise left ankle.   OPERATION:  1.  Repair of deltoid ligament left ankle.  2.  Open reduction internal fixation distal left fibular fracture with 6-      hole DPC plate with syndesmosis screw and reconstitution of ankle      mortise.   SURGEON:  Marlowe Kays, M.D.   ASSISTANT:  Terie Purser, P.A.C.   ANESTHESIA:  General.   PATHOLOGY AND JUSTIFICATION FOR PROCEDURE:  Roughly 10 days ago she injured  her left ankle playing softball.  She was seen in the [facility] Emergency  Room and I saw her on October 02, 2004, with the above injuries.  I attempted  closed reduction and casting in my office but this was unsuccessful and,  consequently, she is here for the above-mentioned surgery.  __________  prophylactic antibiotics.  __________ general anesthesia, pneumatic  tourniquet, leg was esmarched out nonsterilely and prepped from mid calf to  toe tips with DuraPrep, draped in a sterile field.  I first made a curved  incision anterior to the medial malleolus and between the ankle and over the  anterior ankle joint. The deltoid ligament was exposed with blunt  dissection, it was completely shreaded and vaginated between the talus and  the medial malleolus.  I retrieved the flaps of deltoid ligament and cleared  some fibrin type material from the tibiotalar joint, irrigating  it well.  By  inverting the ankle, I was able to bring the talus adjacent to the medial  malleolus.  I then made an incision over the distal fibula.  The superficial  peroneal nerve was noted and protected.  The fracture site was exposed and  it was a long oblique fracture, requiring a fairly long incision to get  above and below the fracture site.  I reduced the fracture with a bougie  type clamp with the position checked with mini C-arm.  I then sized the  fracture.  A 6-hole plate was required to completely cover the fracture  pattern and felt a [facility] type plate was more substantial than the semitubular  and would be the plate of choice.  I also then used the C-arm to determine a  level for syndesmosis screw or screws.  Following this, I placed the plate  on the distal fibula and placed screws above and below the fracture site to  stabilize the plate and then, after checking on AP and  lateral x-rays,  continued the fixation plate, individually drilling, measuring and screwing  the plate.  I was only able to get one syndesmosis screw across the  syndesmosis because of the plate position, trying to hold the fracture, and  also putting the drill holes in the appropriate position.  This close the  medial ankle mortise down.  When the distal fibular plate had been secured,  I went back to the deltoid ligament and repaired it with interrupted 0  Vicryl with the ankle in slight inversion.  We then closed the medial ankle  wound with 3-0 Vicryl in subcutaneous tissue and interrupted 4-0 underlying  mattress sutures in the skin.  The lateral wound closed with interrupted 0  Vicryl in the periosteal facial complex, taking care once again to protect  the superficial peroneal nerve.  Subcutaneous tissue I closed with 3-0  Vicryl and the skin with running 4-0 nylon.  Betadine, Adaptic, dry sterile  dressing with a well padded short leg splint cast were applied.  Tourniquet  was released.  She tolerated  the procedure well and was taken to the  recovery room in satisfactory condition with no known complications.  Duplicated copies of final pictures were taken with one for the relatives  and family and one for my office, documenting good position of the fibular  plate, fibular fracture and anatomic restoration of the mortise.      JA/MEDQ  D:  10/06/2004  T:  10/06/2004  Job:  [id number]

## 2010-09-18 NOTE — Cardiovascular Report (Signed)
Susan Jenkins, Susan Jenkins                ACCOUNT NO.:  [id number]   MEDICAL RECORD NO.:  [id number]          PATIENT TYPE:  INP   LOCATION:  2807                         FACILITY:  [facility]   PHYSICIAN:  Rollene Rotunda, M.D.   DATE OF BIRTH:  [birth year]   DATE OF PROCEDURE:  09/22/2005  DATE OF DISCHARGE:                              CARDIAC CATHETERIZATION   PRIMARY CARE PHYSICIAN:  Darius Bump, M.D.   PROCEDURE:  Left heart catheterization/coronary angiography.   INDICATIONS:  The patient with chest pain suggestive of unstable angina.  She had an EKG with deep anterolateral Q wave inversions.  She had  cardiovascular risk factors.   DESCRIPTION OF PROCEDURE:  Left heart catheterization was performed via the  right femoral artery.  The artery was cannulated using anterior wall  puncture.  A 6-French arterial sheath and a 8-French was inserted via the  modified Seldinger technique.  Preformed Judkins and pigtail catheter were  utilized.  The patient tolerated the procedure well and left the lab in  stable condition.   RESULTS:  1.  Hemodynamics:  LV 152/23, AO 151/96,  2.  Coronaries:  Left main was normal.  The LAD was large wrapping the apex.      It was normal.  There was a first diagonal which was large, moderate      size and normal.  The second diagonal was large and normal.  The      circumflex was large.  It consisted predominantly of mid obtuse marginal      which was large and normal.  The right coronary artery was large,      dominant vessel.  The PDA was large and normal.  There were two small to      moderate size posterolaterals which were normal.   LEFT VENTRICULOGRAM:  The left ventriculogram was obtained in the RAO  projection.  The EF was 65% with normal wall motion.   CONCLUSIONS:  1.  Normal coronaries.  2.  Normal left ventricular function.   PLAN:  No further cardiac work-up is planned.  The patient will have medical  management for her hypertension which  most likely explains her EKG  abnormalities.  She will continue to follow with Dr. Daphine Deutscher for evaluation  of  non-anginal chest pain.  Of note, I think there is a remote possibility that  this could be coronary spasm.  There was no objective evidence of ischemia.  Therefore, a calcium channel blocker is reasonable for both management of  her hypertension as well as to possibly prevent spasm.           ______________________________  Rollene Rotunda, M.D.     JH/MEDQ  D:  09/22/2005  T:  09/23/2005  Job:  [id number]   cc:   Darius Bump, M.D.  Fax: [phone]

## 2011-06-03 ENCOUNTER — Encounter: Payer: Self-pay | Admitting: *Deleted

## 2011-06-08 ENCOUNTER — Encounter: Payer: Self-pay | Admitting: Cardiology

## 2011-06-08 ENCOUNTER — Ambulatory Visit (INDEPENDENT_AMBULATORY_CARE_PROVIDER_SITE_OTHER): Payer: BC Managed Care – PPO | Admitting: Cardiology

## 2011-06-08 VITALS — BP 140/64 | HR 55 | Ht 67.0 in | Wt 240.0 lb

## 2011-06-08 DIAGNOSIS — I10 Essential (primary) hypertension: Secondary | ICD-10-CM

## 2011-06-08 DIAGNOSIS — I421 Obstructive hypertrophic cardiomyopathy: Secondary | ICD-10-CM

## 2011-06-08 DIAGNOSIS — E785 Hyperlipidemia, unspecified: Secondary | ICD-10-CM

## 2011-06-08 NOTE — Patient Instructions (Signed)
Your physician wants you to follow-up in: ONE YEAR You will receive a reminder letter in the mail two months in advance. If you don't receive a letter, please call our office to schedule the follow-up appointment.   Your physician has recommended that you wear a 24 HOUR holter monitor. Holter monitors are medical devices that record the heart's electrical activity. Doctors most often use these monitors to diagnose arrhythmias. Arrhythmias are problems with the speed or rhythm of the heartbeat. The monitor is a small, portable device. You can wear one while you do your normal daily activities. This is usually used to diagnose what is causing palpitations/syncope (passing out).

## 2011-06-08 NOTE — Assessment & Plan Note (Signed)
Blood pressure controlled. Continue present medications. 

## 2011-06-08 NOTE — Progress Notes (Signed)
HPI: Pleasant female I saw in Nov 2011 for evaluation of chest pain. Cardiac catheterization in 1990s and May of 2007 showed normal coronary arteries and normal LV function. Patient states she has had occasional chest pain for years. Cardiac CT in December of 2011 revealed a calcium score of zero. Normal right dominant coronary arteries. No significant lung or soft tissue findings. Abnormal LV apex.  Suggest correlation with echo or MRI. 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 Small pericardial effusion. Followup echocardiogram in December of 2011 revealed normal LV function, trace aortic insufficiency, and mild left atrial enlargement. Since I last saw her in Feb 2012, the patient has dyspnea with more extreme activities but not with routine activities. It is relieved with rest. It is not associated with chest pain. There is no orthopnea, PND or pedal edema. There is no syncope or palpitations. There is no exertional chest pain.   Current Outpatient Prescriptions  Medication Sig Dispense Refill  . aspirin 81 MG tablet Take 81 mg by mouth daily.      . Calcium Carbonate-Vit D-Min 600-400 MG-UNIT TABS Take 1 tablet by mouth daily.      . hydrochlorothiazide (HYDRODIURIL) 25 MG tablet Take 25 mg by mouth daily.      Marland Kitchen latanoprost (XALATAN) 0.005 % ophthalmic solution Place 1 drop into both eyes at bedtime.      Marland Kitchen loratadine (CLARITIN) 10 MG tablet Take 10 mg by mouth daily.      . Melatonin 1 MG TABS Take 1 tablet by mouth at bedtime.      . Multiple Vitamin (MULTIVITAMIN) capsule Take 1 capsule by mouth daily.      . naproxen sodium (ANAPROX) 220 MG tablet Take 220 mg by mouth 2 (two) times daily with a meal.      . Omega-3 Fatty Acids (FISH OIL PO) Take 1 capsule by mouth daily.      Marland Kitchen pyridOXINE (B-6) 50 MG tablet Take 50 mg by mouth daily.      . timolol (TIMOPTIC) 0.5 % ophthalmic solution Place 1 drop into both eyes as directed.      . venlafaxine (EFFEXOR-XR)  75 MG 24 hr capsule Take 75 mg by mouth daily.      Marland Kitchen amLODipine (NORVASC) 10 MG tablet 10 mg Daily.      . CRESTOR 10 MG tablet 10 mg Daily.         Past Medical History  Diagnosis Date  . Hyperlipidemia   . Hypertension   . Vitamin d deficiency   . Hemorrhoids   . Depression   . Pulmonary embolus 1971    following surgery  . HOCM (hypertrophic obstructive cardiomyopathy)     Past Surgical History  Procedure Date  . Ankle surgery   . Knee surgery     History   Social History  . Marital Status: Single    Spouse Name: N/A    Number of Children: N/A  . Years of Education: N/A   Occupational History  . Not on file.   Social History Main Topics  . Smoking status: Former Smoker    Types: Cigarettes    Quit date: 05/03/1980  . Smokeless tobacco: Not on file  . Alcohol Use:   . Drug Use:   . Sexually Active:    Other Topics Concern  . Not on file   Social History Narrative  . No narrative on file    ROS: no fevers or chills, productive cough, hemoptysis, dysphasia,  odynophagia, melena, hematochezia, dysuria, hematuria, rash, seizure activity, orthopnea, PND, pedal edema, claudication. Remaining systems are negative.  Physical Exam: Well-developed well-nourished in no acute distress.  Skin is warm and dry.  HEENT is normal.  Neck is supple. No thyromegaly.  Chest is clear to auscultation with normal expansion.  Cardiovascular exam is regular rate and rhythm.  Abdominal exam nontender or distended. No masses palpated. Extremities show no edema. neuro grossly intact  ECG sinus bradycardia at a rate of 48. Left ventricular hypertrophy with repolarization abnormality.

## 2011-06-08 NOTE — Assessment & Plan Note (Signed)
The patient appears to have a possible variant of hypertrophic cardiomyopathy. However there are no symptoms including no significant dyspnea. She has no history of syncope. There is no family history of sudden death. We arranged a 24-hour Holter monitor to exclude nonsustained ventricular tachycardia previously but she did not have the study; we will reschedule. I will not add any medications as she is asymptomatic and is bradycardic.

## 2011-06-08 NOTE — Assessment & Plan Note (Signed)
Management per primary care. 

## 2011-06-11 ENCOUNTER — Ambulatory Visit: Payer: BC Managed Care – PPO | Admitting: Cardiology

## 2011-07-01 ENCOUNTER — Encounter (INDEPENDENT_AMBULATORY_CARE_PROVIDER_SITE_OTHER): Payer: BC Managed Care – PPO

## 2011-07-01 DIAGNOSIS — R0609 Other forms of dyspnea: Secondary | ICD-10-CM

## 2011-07-07 ENCOUNTER — Telehealth: Payer: Self-pay | Admitting: *Deleted

## 2011-07-07 NOTE — Telephone Encounter (Signed)
Spoke with pt, aware monitor reviewed by dr Jens Som shows sinus with occ PVC, isolated couplet. No NSVT.

## 2013-02-28 ENCOUNTER — Encounter: Payer: Self-pay | Admitting: Family

## 2013-02-28 ENCOUNTER — Ambulatory Visit (INDEPENDENT_AMBULATORY_CARE_PROVIDER_SITE_OTHER): Payer: BC Managed Care – PPO | Admitting: Family

## 2013-02-28 VITALS — BP 138/60 | HR 61 | Ht 66.5 in | Wt 235.0 lb

## 2013-02-28 DIAGNOSIS — F418 Other specified anxiety disorders: Secondary | ICD-10-CM

## 2013-02-28 DIAGNOSIS — E7800 Pure hypercholesterolemia, unspecified: Secondary | ICD-10-CM

## 2013-02-28 DIAGNOSIS — F341 Dysthymic disorder: Secondary | ICD-10-CM

## 2013-02-28 DIAGNOSIS — J309 Allergic rhinitis, unspecified: Secondary | ICD-10-CM

## 2013-02-28 DIAGNOSIS — I10 Essential (primary) hypertension: Secondary | ICD-10-CM

## 2013-02-28 LAB — LIPID PANEL
Cholesterol: 190 mg/dL (ref 0–200)
Total CHOL/HDL Ratio: 3
Triglycerides: 232 mg/dL — ABNORMAL HIGH (ref 0.0–149.0)
VLDL: 46.4 mg/dL — ABNORMAL HIGH (ref 0.0–40.0)

## 2013-02-28 LAB — BASIC METABOLIC PANEL
Chloride: 101 mEq/L (ref 96–112)
Creatinine, Ser: 0.9 mg/dL (ref 0.4–1.2)
Potassium: 4.1 mEq/L (ref 3.5–5.1)

## 2013-02-28 LAB — CBC WITH DIFFERENTIAL/PLATELET
Eosinophils Absolute: 0.2 10*3/uL (ref 0.0–0.7)
Hemoglobin: 14 g/dL (ref 12.0–15.0)
Lymphocytes Relative: 22.7 % (ref 12.0–46.0)
MCV: 97.6 fl (ref 78.0–100.0)
Monocytes Relative: 9.9 % (ref 3.0–12.0)
Neutrophils Relative %: 63.7 % (ref 43.0–77.0)
Platelets: 266 10*3/uL (ref 150.0–400.0)
RBC: 4.16 Mil/uL (ref 3.87–5.11)
RDW: 12.9 % (ref 11.5–14.6)
WBC: 7.3 10*3/uL (ref 4.5–10.5)

## 2013-02-28 LAB — HEPATIC FUNCTION PANEL
ALT: 23 U/L (ref 0–35)
Albumin: 4.4 g/dL (ref 3.5–5.2)
Total Bilirubin: 0.5 mg/dL (ref 0.3–1.2)

## 2013-02-28 NOTE — Progress Notes (Signed)
Subjective:    Patient ID: Susan Jenkins, female    DOB: Sep 06, 1945, 67 y.o.   MRN: [id number]  HPI 67 year old white female, a patient of the practices and to be established. She has a history of hypertension, hyperlipidemia, depression, mitral valve disorder, sciatica, insomnia. She's currently tolerating all of her medications well. She exercises daily and alternates between yoga, water aerobics, and senior softball. Last Pap approximately 6 months ago. She had flu shot 2 weeks ago. Mammogram is due in November and she wishes to schedule it. Last complete physical 2013.   Review of Systems  Constitutional: Negative.   HENT: Negative.   Respiratory: Negative.   Cardiovascular: Negative.   Gastrointestinal: Negative.   Endocrine: Negative.   Genitourinary: Negative.   Musculoskeletal: Negative.   Skin: Negative.   Neurological: Negative.   Hematological: Negative.   Psychiatric/Behavioral: Negative.    Past Medical History  Diagnosis Date  . Hyperlipidemia   . Hypertension   . Vitamin D deficiency   . Hemorrhoids   . Depression   . Pulmonary embolus 1971    following surgery  . HOCM (hypertrophic obstructive cardiomyopathy)     History   Social History  . Marital Status: Single    Spouse Name: N/A    Number of Children: N/A  . Years of Education: N/A   Occupational History  . Not on file.   Social History Main Topics  . Smoking status: Former Smoker    Types: Cigarettes    Quit date: 05/03/1980  . Smokeless tobacco: Not on file  . Alcohol Use:   . Drug Use:   . Sexual Activity:    Other Topics Concern  . Not on file   Social History Narrative  . No narrative on file    Past Surgical History  Procedure Laterality Date  . Ankle surgery    . Knee surgery      No family history on file.  Allergies  Allergen Reactions  . Amoxicillin-Pot Clavulanate     REACTION: anaphylaxis  . Amoxicillin-Pot Clavulanate     Current Outpatient Prescriptions on  File Prior to Visit  Medication Sig Dispense Refill  . amLODipine (NORVASC) 10 MG tablet 10 mg Daily.      Marland Kitchen aspirin 81 MG tablet Take 81 mg by mouth daily.      . Calcium Carbonate-Vit D-Min 600-400 MG-UNIT TABS Take 1 tablet by mouth daily.      . CRESTOR 10 MG tablet 10 mg Daily.      . hydrochlorothiazide (HYDRODIURIL) 25 MG tablet Take 25 mg by mouth daily.      Marland Kitchen latanoprost (XALATAN) 0.005 % ophthalmic solution Place 1 drop into both eyes at bedtime.      Marland Kitchen loratadine (CLARITIN) 10 MG tablet Take 10 mg by mouth daily.      . Melatonin 1 MG TABS Take 1 tablet by mouth at bedtime.      . Multiple Vitamin (MULTIVITAMIN) capsule Take 1 capsule by mouth daily.      . naproxen sodium (ANAPROX) 220 MG tablet Take 220 mg by mouth 2 (two) times daily with a meal.      . Omega-3 Fatty Acids (FISH OIL PO) Take 1 capsule by mouth daily.      Marland Kitchen pyridOXINE (B-6) 50 MG tablet Take 50 mg by mouth daily.      . timolol (TIMOPTIC) 0.5 % ophthalmic solution Place 1 drop into both eyes as directed.      . venlafaxine (  EFFEXOR-XR) 75 MG 24 hr capsule Take 75 mg by mouth daily.       No current facility-administered medications on file prior to visit.    BP 138/60  Pulse 61  Ht 5' 6.5" (1.689 m)  Wt 235 lb (106.595 kg)  BMI 37.37 kg/m2chart    Objective:   Physical Exam  Constitutional: She is oriented to person, place, and time. She appears well-developed and well-nourished.  HENT:  Right Ear: External ear normal.  Left Ear: External ear normal.  Nose: Nose normal.  Mouth/Throat: Oropharynx is clear and moist.  Neck: Normal range of motion. Neck supple.  Cardiovascular: Normal rate, regular rhythm and normal heart sounds.   Pulmonary/Chest: Effort normal and breath sounds normal.  Abdominal: Soft. Bowel sounds are normal. She exhibits no distension. There is no tenderness. There is no rebound.  Musculoskeletal: Normal range of motion.  Neurological: She is alert and oriented to person,  place, and time.  Skin: Skin is warm and dry.  Psychiatric: She has a normal mood and affect.          Assessment & Plan:  Assessment: 1. Hypertension 2. Hyperlipidemia 3. Depression 4. Mitral valve disorder 5. Insomnia  Plan: At patient's request, I spoke with Dr. Kirtland Bouchard. and she seen him in the past to have her switched to an. I had sent fasting labs to include lipids, CBC, BMP, LFTs as outpatient and the results. Recheck in 6 months and sooner as needed. Schedule mammogram.

## 2013-04-05 LAB — HM MAMMOGRAPHY

## 2013-04-14 ENCOUNTER — Encounter: Payer: Self-pay | Admitting: Family

## 2013-07-26 ENCOUNTER — Other Ambulatory Visit: Payer: Self-pay | Admitting: Internal Medicine

## 2013-08-29 ENCOUNTER — Ambulatory Visit (INDEPENDENT_AMBULATORY_CARE_PROVIDER_SITE_OTHER): Payer: Medicare HMO | Admitting: Internal Medicine

## 2013-08-29 ENCOUNTER — Encounter: Payer: Self-pay | Admitting: Internal Medicine

## 2013-08-29 ENCOUNTER — Telehealth: Payer: Self-pay | Admitting: Internal Medicine

## 2013-08-29 ENCOUNTER — Telehealth: Payer: Self-pay | Admitting: Family

## 2013-08-29 VITALS — BP 138/70 | HR 55 | Temp 98.3°F | Resp 20 | Ht 66.5 in | Wt 243.0 lb

## 2013-08-29 DIAGNOSIS — E785 Hyperlipidemia, unspecified: Secondary | ICD-10-CM

## 2013-08-29 DIAGNOSIS — Z800 Family history of malignant neoplasm of digestive organs: Secondary | ICD-10-CM

## 2013-08-29 DIAGNOSIS — I10 Essential (primary) hypertension: Secondary | ICD-10-CM

## 2013-08-29 NOTE — Progress Notes (Signed)
Subjective:    Patient ID: Susan Jenkins, female    DOB: 01-Aug-1945, 68 y.o.   MRN: [id number]  HPI  68 year old patient seen for her 6 month followup.  She has a history of hypertension, as well as dyslipidemia. Remains on Crestor 10 mg daily, which he tolerates well. She does have a history of chest pain and underwent a heart catheterization in 2007 that revealed normal coronary arteries. She is doing well today without concerns or complaints.  Past Medical History  Diagnosis Date  . Hyperlipidemia   . Hypertension   . Vitamin D deficiency   . Hemorrhoids   . Depression   . Pulmonary embolus 1971    following surgery  . HOCM (hypertrophic obstructive cardiomyopathy)     History   Social History  . Marital Status: Single    Spouse Name: N/A    Number of Children: N/A  . Years of Education: N/A   Occupational History  . Not on file.   Social History Main Topics  . Smoking status: Former Smoker    Types: Cigarettes    Quit date: 05/03/1980  . Smokeless tobacco: Not on file  . Alcohol Use:   . Drug Use:   . Sexual Activity:    Other Topics Concern  . Not on file   Social History Narrative  . No narrative on file    Past Surgical History  Procedure Laterality Date  . Ankle surgery    . Knee surgery      No family history on file.  Allergies  Allergen Reactions  . Amoxicillin-Pot Clavulanate     REACTION: anaphylaxis    Current Outpatient Prescriptions on File Prior to Visit  Medication Sig Dispense Refill  . amLODipine (NORVASC) 10 MG tablet TAKE 1 TABLET BY MOUTH ONCE DAILY  30 tablet  1  . aspirin 81 MG tablet Take 81 mg by mouth daily.      . Calcium Carbonate-Vit D-Min 600-400 MG-UNIT TABS Take 1 tablet by mouth daily.      . CRESTOR 10 MG tablet TAKE 1 TABLET BY MOUTH ONCE DAILY  30 tablet  1  . docusate sodium (COLACE) 100 MG capsule Take 100 mg by mouth 2 (two) times daily as needed for constipation.      . hydrochlorothiazide (HYDRODIURIL)  25 MG tablet TAKE 1 TABLET BY MOUTH EVERY DAY  30 tablet  0  . latanoprost (XALATAN) 0.005 % ophthalmic solution Place 1 drop into both eyes at bedtime.      Marland Kitchen loratadine (CLARITIN) 10 MG tablet Take 10 mg by mouth daily.      . Melatonin 1 MG TABS Take 1 tablet by mouth at bedtime.      . Multiple Vitamin (MULTIVITAMIN) capsule Take 1 capsule by mouth daily.      . naproxen sodium (ANAPROX) 220 MG tablet Take 220 mg by mouth 2 (two) times daily with a meal.      . Omega-3 Fatty Acids (FISH OIL PO) Take 1 capsule by mouth daily.      Marland Kitchen pyridOXINE (B-6) 50 MG tablet Take 50 mg by mouth daily.      . timolol (TIMOPTIC) 0.5 % ophthalmic solution Place 1 drop into both eyes as directed.      . Turmeric 500 MG CAPS Take by mouth.      . venlafaxine (EFFEXOR-XR) 75 MG 24 hr capsule Take 75 mg by mouth daily.       No current facility-administered medications  on file prior to visit.    BP 138/70  Pulse 55  Temp(Src) 98.3 F (36.8 C) (Oral)  Resp 20  Ht 5' 6.5" (1.689 m)  Wt 243 lb (110.224 kg)  BMI 38.64 kg/m2  SpO2 96%     Review of Systems  Constitutional: Negative.   HENT: Negative for congestion, dental problem, hearing loss, rhinorrhea, sinus pressure, sore throat and tinnitus.   Eyes: Negative for pain, discharge and visual disturbance.  Respiratory: Negative for cough and shortness of breath.   Cardiovascular: Negative for chest pain, palpitations and leg swelling.  Gastrointestinal: Negative for nausea, vomiting, abdominal pain, diarrhea, constipation, blood in stool and abdominal distention.  Genitourinary: Negative for dysuria, urgency, frequency, hematuria, flank pain, vaginal bleeding, vaginal discharge, difficulty urinating, vaginal pain and pelvic pain.  Musculoskeletal: Negative for arthralgias, gait problem and joint swelling.  Skin: Negative for rash.  Neurological: Negative for dizziness, syncope, speech difficulty, weakness, numbness and headaches.  Hematological:  Negative for adenopathy.  Psychiatric/Behavioral: Negative for behavioral problems, dysphoric mood and agitation. The patient is not nervous/anxious.        Objective:   Physical Exam  Constitutional: She is oriented to person, place, and time. She appears well-developed and well-nourished.  HENT:  Head: Normocephalic.  Right Ear: External ear normal.  Left Ear: External ear normal.  Mouth/Throat: Oropharynx is clear and moist.  Eyes: Conjunctivae and EOM are normal. Pupils are equal, round, and reactive to light.  Neck: Normal range of motion. Neck supple. No thyromegaly present.  Cardiovascular: Normal rate, regular rhythm, normal heart sounds and intact distal pulses.   Pulmonary/Chest: Effort normal and breath sounds normal.  Abdominal: Soft. Bowel sounds are normal. She exhibits no mass. There is no tenderness.  Musculoskeletal: Normal range of motion.  Lymphadenopathy:    She has no cervical adenopathy.  Neurological: She is alert and oriented to person, place, and time.  Skin: Skin is warm and dry. No rash noted.  Psychiatric: She has a normal mood and affect. Her behavior is normal.          Assessment & Plan:   Hypertension well controlled Dyslipidemia.  Continue Crestor.  Samples provided  Recheck 6 months for annual exam

## 2013-08-29 NOTE — Telephone Encounter (Signed)
Susan Jenkins per him to schedule pt for her physical she is an old pt of his.

## 2013-08-29 NOTE — Progress Notes (Signed)
Pre-visit discussion using our clinic review tool. No additional management support is needed unless otherwise documented below in the visit note.  

## 2013-08-29 NOTE — Telephone Encounter (Signed)
Pt would like to switch to dr Burnice Logan, Pt did not give a reason

## 2013-08-29 NOTE — Patient Instructions (Signed)
Limit your sodium (Salt) intake  Please check your blood pressure on a regular basis.  If it is consistently greater than 150/90, please make an office appointment.    It is important that you exercise regularly, at least 20 minutes 3 to 4 times per week.  If you develop chest pain or shortness of breath seek  medical attention.  Return in 6 months for follow-up  

## 2013-08-29 NOTE — Telephone Encounter (Signed)
Pt has been sch for dr Raliegh Ip and pcp banner has been changed

## 2013-08-29 NOTE — Telephone Encounter (Signed)
Relevant patient education mailed to patient.  

## 2013-09-11 ENCOUNTER — Other Ambulatory Visit: Payer: Self-pay | Admitting: Internal Medicine

## 2013-09-25 ENCOUNTER — Other Ambulatory Visit: Payer: Self-pay | Admitting: Internal Medicine

## 2013-10-01 ENCOUNTER — Telehealth: Payer: Self-pay | Admitting: Internal Medicine

## 2013-10-01 ENCOUNTER — Encounter: Payer: Self-pay | Admitting: Internal Medicine

## 2013-10-01 ENCOUNTER — Ambulatory Visit (INDEPENDENT_AMBULATORY_CARE_PROVIDER_SITE_OTHER): Payer: Medicare HMO | Admitting: Internal Medicine

## 2013-10-01 VITALS — BP 146/78 | HR 56 | Temp 98.4°F | Resp 20 | Ht 66.5 in | Wt 242.0 lb

## 2013-10-01 DIAGNOSIS — I10 Essential (primary) hypertension: Secondary | ICD-10-CM

## 2013-10-01 DIAGNOSIS — E785 Hyperlipidemia, unspecified: Secondary | ICD-10-CM

## 2013-10-01 DIAGNOSIS — H8110 Benign paroxysmal vertigo, unspecified ear: Secondary | ICD-10-CM

## 2013-10-01 NOTE — Patient Instructions (Addendum)
Benign Positional Vertigo Vertigo means you feel like you or your surroundings are moving when they are not. Benign positional vertigo is the most common form of vertigo. Benign means that the cause of your condition is not serious. Benign positional vertigo is more common in older adults. CAUSES  Benign positional vertigo is the result of an upset in the labyrinth system. This is an area in the middle ear that helps control your balance. This may be caused by a viral infection, head injury, or repetitive motion. However, often no specific cause is found. SYMPTOMS  Symptoms of benign positional vertigo occur when you move your head or eyes in different directions. Some of the symptoms may include:  Loss of balance and falls.  Vomiting.  Blurred vision.  Dizziness.  Nausea.  Involuntary eye movements (nystagmus). DIAGNOSIS  Benign positional vertigo is usually diagnosed by physical exam. If the specific cause of your benign positional vertigo is unknown, your caregiver may perform imaging tests, such as magnetic resonance imaging (MRI) or computed tomography (CT). TREATMENT  Your caregiver may recommend movements or procedures to correct the benign positional vertigo. Medicines such as meclizine, benzodiazepines, and medicines for nausea may be used to treat your symptoms. In rare cases, if your symptoms are caused by certain conditions that affect the inner ear, you may need surgery. HOME CARE INSTRUCTIONS   Follow your caregiver's instructions.  Move slowly. Do not make sudden body or head movements.  Avoid driving.  Avoid operating heavy machinery.  Avoid performing any tasks that would be dangerous to you or others during a vertigo episode.  Drink enough fluids to keep your urine clear or pale yellow. SEEK IMMEDIATE MEDICAL CARE IF:   You develop problems with walking, weakness, numbness, or using your arms, hands, or legs.  You have difficulty speaking.  You develop  severe headaches.  Your nausea or vomiting continues or gets worse.  You develop visual changes.  Your family or friends notice any behavioral changes.  Your condition gets worse.  You have a fever.  You develop a stiff neck or sensitivity to light. MAKE SURE YOU:   Understand these instructions.  Will watch your condition.  Will get help right away if you are not doing well or get worse. Document Released: 01/25/2006 Document Revised: 07/12/2011 Document Reviewed: 01/07/2011 [facility] Patient Information 2014 [place].   Call or return to clinic prn if these symptoms worsen or fail to improve as anticipated.

## 2013-10-01 NOTE — Telephone Encounter (Signed)
Relevant patient education mailed to patient.  

## 2013-10-01 NOTE — Progress Notes (Signed)
Pre-visit discussion using our clinic review tool. No additional management support is needed unless otherwise documented below in the visit note.  

## 2013-10-01 NOTE — Progress Notes (Signed)
Subjective:    Patient ID: Susan Jenkins, female    DOB: June 22, 1945, 68 y.o.   MRN: [id number]  HPI  68 year old patient who has a history of treated hypertension.  The past 6 days.  She has had vertigo.  This is worse at night when she is in a supine position, and she changes positions.  There has been some occasional unsteadiness throughout the day.  She describes true vertigo with spinning and some nausea. She has been using antihistamines with some benefit.  Symptoms seem to be improving.  Past Medical History  Diagnosis Date  . Hyperlipidemia   . Hypertension   . Vitamin D deficiency   . Hemorrhoids   . Depression   . Pulmonary embolus 1971    following surgery  . HOCM (hypertrophic obstructive cardiomyopathy)     History   Social History  . Marital Status: Single    Spouse Name: N/A    Number of Children: N/A  . Years of Education: N/A   Occupational History  . Not on file.   Social History Main Topics  . Smoking status: Former Smoker    Types: Cigarettes    Quit date: 05/03/1980  . Smokeless tobacco: Not on file  . Alcohol Use:   . Drug Use:   . Sexual Activity:    Other Topics Concern  . Not on file   Social History Narrative  . No narrative on file    Past Surgical History  Procedure Laterality Date  . Ankle surgery    . Knee surgery      No family history on file.  Allergies  Allergen Reactions  . Amoxicillin-Pot Clavulanate     REACTION: anaphylaxis    Current Outpatient Prescriptions on File Prior to Visit  Medication Sig Dispense Refill  . amLODipine (NORVASC) 10 MG tablet TAKE 1 TABLET BY MOUTH ONCE DAILY  30 tablet  5  . aspirin 81 MG tablet Take 81 mg by mouth daily.      . Calcium Carbonate-Vit D-Min 600-400 MG-UNIT TABS Take 1 tablet by mouth daily.      . CRESTOR 10 MG tablet TAKE 1 TABLET BY MOUTH ONCE DAILY  30 tablet  5  . docusate sodium (COLACE) 100 MG capsule Take 100 mg by mouth 2 (two) times daily as needed for  constipation.      . hydrochlorothiazide (HYDRODIURIL) 25 MG tablet TAKE 1 TABLET BY MOUTH EVERY DAY  30 tablet  5  . latanoprost (XALATAN) 0.005 % ophthalmic solution Place 1 drop into both eyes at bedtime.      Marland Kitchen loratadine (CLARITIN) 10 MG tablet Take 10 mg by mouth daily.      . Melatonin 1 MG TABS Take 1 tablet by mouth at bedtime.      . Multiple Vitamin (MULTIVITAMIN) capsule Take 1 capsule by mouth daily.      . naproxen sodium (ANAPROX) 220 MG tablet Take 220 mg by mouth 2 (two) times daily with a meal.      . Omega-3 Fatty Acids (FISH OIL PO) Take 1 capsule by mouth daily.      Marland Kitchen pyridOXINE (B-6) 50 MG tablet Take 50 mg by mouth daily.      . timolol (TIMOPTIC) 0.5 % ophthalmic solution Place 1 drop into both eyes as directed.      . Turmeric 500 MG CAPS Take by mouth.      . venlafaxine (EFFEXOR-XR) 75 MG 24 hr capsule Take 75 mg by  mouth daily.       No current facility-administered medications on file prior to visit.    BP 146/78  Pulse 56  Temp(Src) 98.4 F (36.9 C) (Oral)  Resp 20  Ht 5' 6.5" (1.689 m)  Wt 242 lb (109.77 kg)  BMI 38.48 kg/m2  SpO2 97%       Review of Systems  Constitutional: Negative.   HENT: Negative for congestion, dental problem, hearing loss, rhinorrhea, sinus pressure, sore throat and tinnitus.   Eyes: Negative for pain, discharge and visual disturbance.  Respiratory: Negative for cough and shortness of breath.   Cardiovascular: Negative for chest pain, palpitations and leg swelling.  Gastrointestinal: Positive for nausea. Negative for vomiting, abdominal pain, diarrhea, constipation, blood in stool and abdominal distention.  Genitourinary: Negative for dysuria, urgency, frequency, hematuria, flank pain, vaginal bleeding, vaginal discharge, difficulty urinating, vaginal pain and pelvic pain.  Musculoskeletal: Negative for arthralgias, gait problem and joint swelling.  Skin: Negative for rash.  Neurological: Positive for dizziness and  light-headedness. Negative for syncope, speech difficulty, weakness, numbness and headaches.  Hematological: Negative for adenopathy.  Psychiatric/Behavioral: Negative for behavioral problems, dysphoric mood and agitation. The patient is not nervous/anxious.        Objective:   Physical Exam  Constitutional: She is oriented to person, place, and time. She appears well-developed and well-nourished.  Blood pressure 130/70  HENT:  Head: Normocephalic.  Right Ear: External ear normal.  Left Ear: External ear normal.  Mouth/Throat: Oropharynx is clear and moist.  Eyes: Conjunctivae and EOM are normal. Pupils are equal, round, and reactive to light.  Neck: Normal range of motion. Neck supple. No thyromegaly present.  Cardiovascular: Normal rate, regular rhythm, normal heart sounds and intact distal pulses.   Pulmonary/Chest: Effort normal and breath sounds normal.  Abdominal: Soft. Bowel sounds are normal. She exhibits no mass. There is no tenderness.  Musculoskeletal: Normal range of motion.  Lymphadenopathy:    She has no cervical adenopathy.  Neurological: She is alert and oriented to person, place, and time. No cranial nerve deficit.  Normal finger to nose testing  Skin: Skin is warm and dry. No rash noted.  Psychiatric: She has a normal mood and affect. Her behavior is normal.          Assessment & Plan:   Benign positional vertigo.  We'll continue antihistamines.  Will give instructions on repositioning exercises Hypertension stable

## 2013-10-10 ENCOUNTER — Telehealth: Payer: Self-pay | Admitting: Internal Medicine

## 2013-10-10 MED ORDER — HYDROCHLOROTHIAZIDE 25 MG PO TABS: ORAL_TABLET | ORAL | Status: DC

## 2013-10-10 NOTE — Telephone Encounter (Signed)
Rx sent 

## 2013-10-10 NOTE — Telephone Encounter (Signed)
[id number] - [place], [place] - [address] AT [address] is requesting 90 day re-fill on hydrochlorothiazide (HYDRODIURIL) 25 MG tablet

## 2013-10-30 ENCOUNTER — Other Ambulatory Visit: Payer: Self-pay | Admitting: Internal Medicine

## 2014-02-05 ENCOUNTER — Other Ambulatory Visit: Payer: Self-pay | Admitting: *Deleted

## 2014-02-05 MED ORDER — ROSUVASTATIN CALCIUM 10 MG PO TABS: ORAL_TABLET | ORAL | Status: DC

## 2014-02-27 ENCOUNTER — Other Ambulatory Visit (INDEPENDENT_AMBULATORY_CARE_PROVIDER_SITE_OTHER): Payer: Medicare HMO

## 2014-02-27 DIAGNOSIS — E785 Hyperlipidemia, unspecified: Secondary | ICD-10-CM

## 2014-02-27 DIAGNOSIS — Z0000 Encounter for general adult medical examination without abnormal findings: Secondary | ICD-10-CM

## 2014-02-27 DIAGNOSIS — I10 Essential (primary) hypertension: Secondary | ICD-10-CM

## 2014-02-27 LAB — CBC WITH DIFFERENTIAL/PLATELET
Basophils Absolute: 0.1 10*3/uL (ref 0.0–0.1)
Basophils Relative: 0.8 % (ref 0.0–3.0)
EOS PCT: 3.1 % (ref 0.0–5.0)
Eosinophils Absolute: 0.2 10*3/uL (ref 0.0–0.7)
HEMATOCRIT: 40.8 % (ref 36.0–46.0)
Hemoglobin: 13.6 g/dL (ref 12.0–15.0)
LYMPHS ABS: 2 10*3/uL (ref 0.7–4.0)
LYMPHS PCT: 29.9 % (ref 12.0–46.0)
MCHC: 33.3 g/dL (ref 30.0–36.0)
MCV: 100.6 fl — ABNORMAL HIGH (ref 78.0–100.0)
MONOS PCT: 10.2 % (ref 3.0–12.0)
Monocytes Absolute: 0.7 10*3/uL (ref 0.1–1.0)
Neutro Abs: 3.7 10*3/uL (ref 1.4–7.7)
Neutrophils Relative %: 56 % (ref 43.0–77.0)
PLATELETS: 248 10*3/uL (ref 150.0–400.0)
RBC: 4.06 Mil/uL (ref 3.87–5.11)
RDW: 13.6 % (ref 11.5–15.5)
WBC: 6.6 10*3/uL (ref 4.0–10.5)

## 2014-02-27 LAB — POCT URINALYSIS DIPSTICK
Bilirubin, UA: NEGATIVE
Glucose, UA: NEGATIVE
Ketones, UA: NEGATIVE
Nitrite, UA: NEGATIVE
Protein, UA: NEGATIVE
Spec Grav, UA: 1.01
Urobilinogen, UA: 0.2
pH, UA: 7

## 2014-02-27 LAB — LIPID PANEL
Cholesterol: 174 mg/dL (ref 0–200)
HDL: 70.9 mg/dL (ref >39.00)
LDL CALC: 78 mg/dL (ref 0–99)
NonHDL: 103.1
Total CHOL/HDL Ratio: 2
Triglycerides: 125 mg/dL (ref 0.0–149.0)
VLDL: 25 mg/dL (ref 0.0–40.0)

## 2014-02-27 LAB — BASIC METABOLIC PANEL WITH GFR
BUN: 17 mg/dL (ref 6–23)
CO2: 21 meq/L (ref 19–32)
Calcium: 9.8 mg/dL (ref 8.4–10.5)
Chloride: 104 meq/L (ref 96–112)
Creatinine, Ser: 1.1 mg/dL (ref 0.4–1.2)
GFR: 54.54 mL/min — ABNORMAL LOW
Glucose, Bld: 98 mg/dL (ref 70–99)
Potassium: 4.7 meq/L (ref 3.5–5.1)
Sodium: 142 meq/L (ref 135–145)

## 2014-02-27 LAB — HEPATIC FUNCTION PANEL
ALT: 22 U/L (ref 0–35)
AST: 29 U/L (ref 0–37)
Albumin: 3.6 g/dL (ref 3.5–5.2)
Alkaline Phosphatase: 45 U/L (ref 39–117)
Bilirubin, Direct: 0 mg/dL (ref 0.0–0.3)
Total Bilirubin: 0.4 mg/dL (ref 0.2–1.2)
Total Protein: 6.9 g/dL (ref 6.0–8.3)

## 2014-02-27 LAB — TSH: TSH: 1.99 u[IU]/mL (ref 0.35–4.50)

## 2014-03-06 ENCOUNTER — Ambulatory Visit (INDEPENDENT_AMBULATORY_CARE_PROVIDER_SITE_OTHER): Payer: Medicare HMO | Admitting: Internal Medicine

## 2014-03-06 ENCOUNTER — Other Ambulatory Visit (HOSPITAL_COMMUNITY)
Admission: RE | Admit: 2014-03-06 | Discharge: 2014-03-06 | Disposition: A | Discharge status: Home / Self Care | Payer: Medicare HMO | Source: Ambulatory Visit | Attending: Internal Medicine | Admitting: Internal Medicine

## 2014-03-06 ENCOUNTER — Encounter: Payer: Self-pay | Admitting: Internal Medicine

## 2014-03-06 DIAGNOSIS — I10 Essential (primary) hypertension: Secondary | ICD-10-CM

## 2014-03-06 DIAGNOSIS — Z23 Encounter for immunization: Secondary | ICD-10-CM

## 2014-03-06 DIAGNOSIS — E785 Hyperlipidemia, unspecified: Secondary | ICD-10-CM

## 2014-03-06 DIAGNOSIS — Z124 Encounter for screening for malignant neoplasm of cervix: Secondary | ICD-10-CM | POA: Insufficient documentation

## 2014-03-06 DIAGNOSIS — I421 Obstructive hypertrophic cardiomyopathy: Secondary | ICD-10-CM

## 2014-03-06 DIAGNOSIS — Z0000 Encounter for general adult medical examination without abnormal findings: Secondary | ICD-10-CM

## 2014-03-06 MED ORDER — VENLAFAXINE HCL ER 75 MG PO CP24: 75.0000 mg | ORAL_CAPSULE | Freq: Every day | ORAL | Status: DC

## 2014-03-06 MED ORDER — AMLODIPINE BESYLATE 10 MG PO TABS: ORAL_TABLET | ORAL | Status: DC

## 2014-03-06 MED ORDER — HYDROCHLOROTHIAZIDE 25 MG PO TABS: ORAL_TABLET | ORAL | Status: DC

## 2014-03-06 MED ORDER — ATORVASTATIN CALCIUM 20 MG PO TABS: 20.0000 mg | ORAL_TABLET | Freq: Every day | ORAL | Status: DC

## 2014-03-06 NOTE — Progress Notes (Signed)
Pre visit review using our clinic review tool, if applicable. No additional management support is needed unless otherwise documented below in the visit note. 

## 2014-03-06 NOTE — Progress Notes (Signed)
Subjective:    Patient ID: Susan Jenkins, female    DOB: November 09, 1945, 68 y.o.   MRN: [id number]  HPI  68 year old patient who is seen today for a preventive health examination She does quite well.  She does have a history of glaucoma and is seen by ophthalmology.  4 times per year.  She is also seen by orthopedics periodically due to right knee pain.  She has a history of mild hypertrophic cardiomyopathy and has done quite well.  She has essential hypertension and mild dyslipidemia.  She has been objecting to the cost of Crestor, but she tolerates well.  She did have some muscle pain with simvastatin in the past Colonoscopy 2014 Last Pap 2011  Past Medical History  Diagnosis Date  . Hyperlipidemia   . Hypertension   . Vitamin D deficiency   . Hemorrhoids   . Depression   . Pulmonary embolus 1971    following surgery  . HOCM (hypertrophic obstructive cardiomyopathy)     History   Social History  . Marital Status: Single    Spouse Name: N/A    Number of Children: N/A  . Years of Education: N/A   Occupational History  . Not on file.   Social History Main Topics  . Smoking status: Former Smoker    Types: Cigarettes    Quit date: 05/03/1980  . Smokeless tobacco: Not on file  . Alcohol Use: Not on file  . Drug Use: Not on file  . Sexual Activity: Not on file   Other Topics Concern  . Not on file   Social History Narrative    Past Surgical History  Procedure Laterality Date  . Ankle surgery    . Knee surgery      No family history on file.  Allergies  Allergen Reactions  . Amoxicillin-Pot Clavulanate     REACTION: anaphylaxis    Current Outpatient Prescriptions on File Prior to Visit  Medication Sig Dispense Refill  . amLODipine (NORVASC) 10 MG tablet TAKE 1 TABLET BY MOUTH ONCE DAILY 30 tablet 5  . aspirin 81 MG tablet Take 81 mg by mouth daily.    . Calcium Carbonate-Vit D-Min 600-400 MG-UNIT TABS Take 1 tablet by mouth daily.    Marland Kitchen docusate sodium  (COLACE) 100 MG capsule Take 100 mg by mouth 2 (two) times daily as needed for constipation.    . hydrochlorothiazide (HYDRODIURIL) 25 MG tablet TAKE 1 TABLET BY MOUTH EVERY DAY 90 tablet 3  . latanoprost (XALATAN) 0.005 % ophthalmic solution Place 1 drop into both eyes at bedtime.    Marland Kitchen loratadine (CLARITIN) 10 MG tablet Take 10 mg by mouth daily.    . Meclizine HCl (BONINE) 25 MG CHEW Chew 1 tablet by mouth 2 (two) times daily.    . Melatonin 1 MG TABS Take 1 tablet by mouth at bedtime.    . Multiple Vitamin (MULTIVITAMIN) capsule Take 1 capsule by mouth daily.    . naproxen sodium (ANAPROX) 220 MG tablet Take 220 mg by mouth 2 (two) times daily with a meal.    . Omega-3 Fatty Acids (FISH OIL PO) Take 1 capsule by mouth daily.    Marland Kitchen pyridOXINE (B-6) 50 MG tablet Take 50 mg by mouth daily.    . rosuvastatin (CRESTOR) 10 MG tablet TAKE 1 TABLET BY MOUTH ONCE DAILY 90 tablet 1  . timolol (TIMOPTIC) 0.5 % ophthalmic solution Place 1 drop into both eyes as directed.    . Turmeric 500 MG  CAPS Take by mouth.    . venlafaxine (EFFEXOR-XR) 75 MG 24 hr capsule Take 75 mg by mouth daily.     No current facility-administered medications on file prior to visit.    BP 120/60 mmHg  Pulse 61  Temp(Src) 98.3 F (36.8 C) (Oral)  Resp 20  Ht 5' 6.5" (1.689 m)  Wt 244 lb (110.678 kg)  BMI 38.80 kg/m2  SpO2 95%   1. Risk factors, based on past  M,S,F history.  Cardiovascular risk factors include hypertension and dyslipidemia  2.  Physical activities:remains quite active, although limited somewhat by right knee pain.  Participates in [employer] and softball  3.  Depression/mood:history of mild depression, which has been well controlled 4.  Hearing:  5.  ADL's:independent in all aspects of daily living  6.  Fall risk:low  7.  Home safety:no problems identified  8.  Height weight, and visual acuity;height and weight stable.  Followed by ophthalmology.  4 times per year due to glaucoma  9.   Counseling:heart healthy diet, modest weight loss, regular exercise, all encouraged  10. Lab orders based on risk factors:laboratory profile including lipid panel reviewed  11. Referral :follow-up ophthalmology and orthopedics  12. Care plan:heart healthy diet, modest weight loss encouraged  13. Cognitive assessment: alert and oriented with normal affect.  No cognitive dysfunction  14. Screening:we'll continue to receive the annual mammograms.  A sister recently diagnosed with breast cancer will receive annual preventive care.  Examinations.  Will be followed by ophthalmology.  Quarterly and orthopedics. Patient was provided with a written and personalized care plan  15. Provider List Update: ophthalmology, orthopedics, primary care    Review of Systems  Constitutional: Negative for fever, appetite change, fatigue and unexpected weight change.  HENT: Negative for congestion, dental problem, ear pain, hearing loss, mouth sores, nosebleeds, sinus pressure, sore throat, tinnitus, trouble swallowing and voice change.   Eyes: Negative for photophobia, pain, redness and visual disturbance.  Respiratory: Negative for cough, chest tightness and shortness of breath.   Cardiovascular: Negative for chest pain, palpitations and leg swelling.  Gastrointestinal: Negative for nausea, vomiting, abdominal pain, diarrhea, constipation, blood in stool, abdominal distention and rectal pain.  Genitourinary: Negative for dysuria, urgency, frequency, hematuria, flank pain, vaginal bleeding, vaginal discharge, difficulty urinating, genital sores, vaginal pain, menstrual problem and pelvic pain.  Musculoskeletal: Negative for back pain, arthralgias and neck stiffness.       Right knee pain  Skin: Negative for rash.  Neurological: Negative for dizziness, syncope, speech difficulty, weakness, light-headedness, numbness and headaches.  Hematological: Negative for adenopathy. Does not bruise/bleed easily.    Psychiatric/Behavioral: Negative for suicidal ideas, behavioral problems, self-injury, dysphoric mood and agitation. The patient is not nervous/anxious.        Objective:   Physical Exam  Constitutional: She is oriented to person, place, and time. She appears well-developed and well-nourished.  Weight 244 Blood pressure low normal  HENT:  Head: Normocephalic and atraumatic.  Right Ear: External ear normal.  Left Ear: External ear normal.  Mouth/Throat: Oropharynx is clear and moist.  Eyes: Conjunctivae and EOM are normal.  Neck: Normal range of motion. Neck supple. No JVD present. No thyromegaly present.  Cardiovascular: Normal rate, regular rhythm and intact distal pulses.   Murmur heard. Grade 2/6 systolic murmur at the base  Pulmonary/Chest: Effort normal and breath sounds normal. She has no wheezes. She has no rales.  Abdominal: Soft. Bowel sounds are normal. She exhibits no distension and no mass. There  is no tenderness. There is no rebound and no guarding.  Genitourinary: Vagina normal and uterus normal. No vaginal discharge found.  Musculoskeletal: Normal range of motion. She exhibits no edema or tenderness.  Surgical scars, left medial ankle and right knee  Neurological: She is alert and oriented to person, place, and time. She has normal reflexes. No cranial nerve deficit. She exhibits normal muscle tone. Coordination normal.  Skin: Skin is warm and dry. No rash noted.  Psychiatric: She has a normal mood and affect. Her behavior is normal.          Assessment & Plan:   Preventive health examination Hypertension, well-controlled Dyslipidemia.  Will give a trial of atorvastatin Osteoarthritis Glaucoma.  Follow-up ophthalmology  Recheck 6 months

## 2014-03-06 NOTE — Patient Instructions (Signed)
Limit your sodium (Salt) intake  Please check your blood pressure on a regular basis.  If it is consistently greater than 150/90, please make an office appointment.    It is important that you exercise regularly, at least 20 minutes 3 to 4 times per week.  If you develop chest pain or shortness of breath seek  medical attention.  You need to lose weight.  Consider a lower calorie diet and regular exercise.  Health Maintenance Adopting a healthy lifestyle and getting preventive care can go a long way to promote health and wellness. Talk with your health care provider about what schedule of regular examinations is right for you. This is a good chance for you to check in with your provider about disease prevention and staying healthy. In between checkups, there are plenty of things you can do on your own. Experts have done a lot of research about which lifestyle changes and preventive measures are most likely to keep you healthy. Ask your health care provider for more information. WEIGHT AND DIET  Eat a healthy diet  Be sure to include plenty of vegetables, fruits, low-fat dairy products, and lean protein.  Do not eat a lot of foods high in solid fats, added sugars, or salt.  Get regular exercise. This is one of the most important things you can do for your health.  Most adults should exercise for at least 150 minutes each week. The exercise should increase your heart rate and make you sweat (moderate-intensity exercise).  Most adults should also do strengthening exercises at least twice a week. This is in addition to the moderate-intensity exercise.  Maintain a healthy weight  Body mass index (BMI) is a measurement that can be used to identify possible weight problems. It estimates body fat based on height and weight. Your health care provider can help determine your BMI and help you achieve or maintain a healthy weight.  For females 66 years of age and older:   A BMI below 18.5 is  considered underweight.  A BMI of 18.5 to 24.9 is normal.  A BMI of 25 to 29.9 is considered overweight.  A BMI of 30 and above is considered obese.  Watch levels of cholesterol and blood lipids  You should start having your blood tested for lipids and cholesterol at 66 years of age, then have this test every 5 years.  You may need to have your cholesterol levels checked more often if:  Your lipid or cholesterol levels are high.  You are older than 66 years of age.  You are at high risk for heart disease.  CANCER SCREENING   Lung Cancer  Lung cancer screening is recommended for adults 66-66 years old who are at high risk for lung cancer because of a history of smoking.  A yearly low-dose CT scan of the lungs is recommended for people who:  Currently smoke.  Have quit within the past 15 years.  Have at least a 30-pack-year history of smoking. A pack year is smoking an average of one pack of cigarettes a day for 1 year.  Yearly screening should continue until it has been 15 years since you quit.  Yearly screening should stop if you develop a health problem that would prevent you from having lung cancer treatment.  Breast Cancer  Practice breast self-awareness. This means understanding how your breasts normally appear and feel.  It also means doing regular breast self-exams. Let your health care provider know about any changes, no matter  how small.  If you are in your 66s or 66s, you should have a clinical breast exam (CBE) by a health care provider every 1-3 years as part of a regular health exam.  If you are 66 or older, have a CBE every year. Also consider having a breast X-ray (mammogram) every year.  If you have a family history of breast cancer, talk to your health care provider about genetic screening.  If you are at high risk for breast cancer, talk to your health care provider about having an MRI and a mammogram every year.  Breast cancer gene (BRCA)  assessment is recommended for women who have family members with BRCA-related cancers. BRCA-related cancers include:  Breast.  Ovarian.  Tubal.  Peritoneal cancers.  Results of the assessment will determine the need for genetic counseling and BRCA1 and BRCA2 testing. Cervical Cancer Routine pelvic examinations to screen for cervical cancer are no longer recommended for nonpregnant women who are considered low risk for cancer of the pelvic organs (ovaries, uterus, and vagina) and who do not have symptoms. A pelvic examination may be necessary if you have symptoms including those associated with pelvic infections. Ask your health care provider if a screening pelvic exam is right for you.   The Pap test is the screening test for cervical cancer for women who are considered at risk.  If you had a hysterectomy for a problem that was not cancer or a condition that could lead to cancer, then you no longer need Pap tests.  If you are older than 66 years, and you have had normal Pap tests for the past 10 years, you no longer need to have Pap tests.  If you have had past treatment for cervical cancer or a condition that could lead to cancer, you need Pap tests and screening for cancer for at least 20 years after your treatment.  If you no longer get a Pap test, assess your risk factors if they change (such as having a new sexual partner). This can affect whether you should start being screened again.  Some women have medical problems that increase their chance of getting cervical cancer. If this is the case for you, your health care provider may recommend more frequent screening and Pap tests.  The human papillomavirus (HPV) test is another test that may be used for cervical cancer screening. The HPV test looks for the virus that can cause cell changes in the cervix. The cells collected during the Pap test can be tested for HPV.  The HPV test can be used to screen women 66 years of age and older.  Getting tested for HPV can extend the interval between normal Pap tests from three to five years.  An HPV test also should be used to screen women of any age who have unclear Pap test results.  After 66 years of age, women should have HPV testing as often as Pap tests.  Colorectal Cancer  This type of cancer can be detected and often prevented.  Routine colorectal cancer screening usually begins at 66 years of age and continues through 66 years of age.  Your health care provider may recommend screening at an earlier age if you have risk factors for colon cancer.  Your health care provider may also recommend using home test kits to check for hidden blood in the stool.  A small camera at the end of a tube can be used to examine your colon directly (sigmoidoscopy or colonoscopy). This is done  to check for the earliest forms of colorectal cancer.  Routine screening usually begins at age 50.  Direct examination of the colon should be repeated every 5-10 years through 66 years of age. However, you may need to be screened more often if early forms of precancerous polyps or small growths are found. Skin Cancer  Check your skin from head to toe regularly.  Tell your health care provider about any new moles or changes in moles, especially if there is a change in a mole's shape or color.  Also tell your health care provider if you have a mole that is larger than the size of a pencil eraser.  Always use sunscreen. Apply sunscreen liberally and repeatedly throughout the day.  Protect yourself by wearing long sleeves, pants, a wide-brimmed hat, and sunglasses whenever you are outside. HEART DISEASE, DIABETES, AND HIGH BLOOD PRESSURE   Have your blood pressure checked at least every 1-2 years. High blood pressure causes heart disease and increases the risk of stroke.  If you are between 55 years and 79 years old, ask your health care provider if you should take aspirin to prevent  strokes.  Have regular diabetes screenings. This involves taking a blood sample to check your fasting blood sugar level.  If you are at a normal weight and have a low risk for diabetes, have this test once every three years after 66 years of age.  If you are overweight and have a high risk for diabetes, consider being tested at a younger age or more often. PREVENTING INFECTION  Hepatitis B  If you have a higher risk for hepatitis B, you should be screened for this virus. You are considered at high risk for hepatitis B if:  You were born in a country where hepatitis B is common. Ask your health care provider which countries are considered high risk.  Your parents were born in a high-risk country, and you have not been immunized against hepatitis B (hepatitis B vaccine).  You have HIV or AIDS.  You use needles to inject street drugs.  You live with someone who has hepatitis B.  You have had sex with someone who has hepatitis B.  You get hemodialysis treatment.  You take certain medicines for conditions, including cancer, organ transplantation, and autoimmune conditions. Hepatitis C  Blood testing is recommended for:  Everyone born from 1945 through 1965.  Anyone with known risk factors for hepatitis C. Sexually transmitted infections (STIs)  You should be screened for sexually transmitted infections (STIs) including gonorrhea and chlamydia if:  You are sexually active and are younger than 66 years of age.  You are older than 66 years of age and your health care provider tells you that you are at risk for this type of infection.  Your sexual activity has changed since you were last screened and you are at an increased risk for chlamydia or gonorrhea. Ask your health care provider if you are at risk.  If you do not have HIV, but are at risk, it may be recommended that you take a prescription medicine daily to prevent HIV infection. This is called pre-exposure prophylaxis  (PrEP). You are considered at risk if:  You are sexually active and do not regularly use condoms or know the HIV status of your partner(s).  You take drugs by injection.  You are sexually active with a partner who has HIV. Talk with your health care provider about whether you are at high risk of being infected with   HIV. If you choose to begin PrEP, you should first be tested for HIV. You should then be tested every 3 months for as long as you are taking PrEP.  PREGNANCY   If you are premenopausal and you may become pregnant, ask your health care provider about preconception counseling.  If you may become pregnant, take 400 to 800 micrograms (mcg) of folic acid every day.  If you want to prevent pregnancy, talk to your health care provider about birth control (contraception). OSTEOPOROSIS AND MENOPAUSE   Osteoporosis is a disease in which the bones lose minerals and strength with aging. This can result in serious bone fractures. Your risk for osteoporosis can be identified using a bone density scan.  If you are 65 years of age or older, or if you are at risk for osteoporosis and fractures, ask your health care provider if you should be screened.  Ask your health care provider whether you should take a calcium or vitamin D supplement to lower your risk for osteoporosis.  Menopause may have certain physical symptoms and risks.  Hormone replacement therapy may reduce some of these symptoms and risks. Talk to your health care provider about whether hormone replacement therapy is right for you.  HOME CARE INSTRUCTIONS   Schedule regular health, dental, and eye exams.  Stay current with your immunizations.   Do not use any tobacco products including cigarettes, chewing tobacco, or electronic cigarettes.  If you are pregnant, do not drink alcohol.  If you are breastfeeding, limit how much and how often you drink alcohol.  Limit alcohol intake to no more than 1 drink per day for  nonpregnant women. One drink equals 12 ounces of beer, 5 ounces of wine, or 1 ounces of hard liquor.  Do not use street drugs.  Do not share needles.  Ask your health care provider for help if you need support or information about quitting drugs.  Tell your health care provider if you often feel depressed.  Tell your health care provider if you have ever been abused or do not feel safe at home. Document Released: 11/02/2010 Document Revised: 09/03/2013 Document Reviewed: 03/21/2013 [employer] Patient Information 2015 [employer]. This information is not intended to replace advice given to you by your health care provider. Make sure you discuss any questions you have with your health care provider.  

## 2014-03-07 LAB — CYTOLOGY - PAP

## 2014-03-08 ENCOUNTER — Other Ambulatory Visit: Payer: Self-pay | Admitting: Internal Medicine

## 2014-04-12 LAB — HM MAMMOGRAPHY

## 2014-04-16 ENCOUNTER — Encounter: Payer: Self-pay | Admitting: Internal Medicine

## 2014-04-24 ENCOUNTER — Encounter: Payer: Self-pay | Admitting: Internal Medicine

## 2014-05-21 ENCOUNTER — Encounter: Payer: Self-pay | Admitting: Internal Medicine

## 2014-05-21 ENCOUNTER — Ambulatory Visit (INDEPENDENT_AMBULATORY_CARE_PROVIDER_SITE_OTHER): Payer: PPO | Admitting: Internal Medicine

## 2014-05-21 VITALS — BP 138/68 | HR 54 | Temp 98.0°F | Resp 20 | Ht 66.5 in | Wt 245.0 lb

## 2014-05-21 DIAGNOSIS — I10 Essential (primary) hypertension: Secondary | ICD-10-CM

## 2014-05-21 DIAGNOSIS — I421 Obstructive hypertrophic cardiomyopathy: Secondary | ICD-10-CM

## 2014-05-21 DIAGNOSIS — E785 Hyperlipidemia, unspecified: Secondary | ICD-10-CM

## 2014-05-21 MED ORDER — CHLOROQUINE PHOSPHATE 250 MG PO TABS: ORAL_TABLET | ORAL | Status: DC

## 2014-05-21 MED ORDER — CIPROFLOXACIN HCL 500 MG PO TABS: 500.0000 mg | ORAL_TABLET | Freq: Two times a day (BID) | ORAL | Status: DC

## 2014-05-21 NOTE — Patient Instructions (Signed)

## 2014-05-21 NOTE — Progress Notes (Signed)
Pre visit review using our clinic review tool, if applicable. No additional management support is needed unless otherwise documented below in the visit note. 

## 2014-05-21 NOTE — Progress Notes (Signed)
Subjective:    Patient ID: Susan Jenkins, female    DOB: Sep 10, 1945, 69 y.o.   MRN: [id number]  HPI 69 year old patient who is seen today in follow-up.  She has a history of dyslipidemia and presently is on atorvastatin.  Crestor discontinued last visit.  She continues to tolerate this well. She has essential hypertension which has been stable She will be participating in a mission trip in [place] next month and requires chloroquine and Cipro prescriptions.  She has had these medications before and tolerated without difficulty. She has embarked on a weight loss program  Past Medical History  Diagnosis Date  . Hyperlipidemia   . Hypertension   . Vitamin D deficiency   . Hemorrhoids   . Depression   . Pulmonary embolus 1971    following surgery  . HOCM (hypertrophic obstructive cardiomyopathy)     History   Social History  . Marital Status: Single    Spouse Name: N/A    Number of Children: N/A  . Years of Education: N/A   Occupational History  . Not on file.   Social History Main Topics  . Smoking status: Former Smoker    Types: Cigarettes    Quit date: 05/03/1980  . Smokeless tobacco: Not on file  . Alcohol Use: Not on file  . Drug Use: Not on file  . Sexual Activity: Not on file   Other Topics Concern  . Not on file   Social History Narrative    Past Surgical History  Procedure Laterality Date  . Ankle surgery    . Knee surgery      No family history on file.  Allergies  Allergen Reactions  . Amoxicillin-Pot Clavulanate     REACTION: anaphylaxis    Current Outpatient Prescriptions on File Prior to Visit  Medication Sig Dispense Refill  . amLODipine (NORVASC) 10 MG tablet TAKE 1 TABLET BY MOUTH ONCE DAILY 30 tablet 5  . aspirin 81 MG tablet Take 81 mg by mouth daily.    Marland Kitchen atorvastatin (LIPITOR) 20 MG tablet Take 1 tablet (20 mg total) by mouth daily. 90 tablet 3  . Calcium Carbonate-Vit D-Min 600-400 MG-UNIT TABS Take 1 tablet by mouth daily.      Marland Kitchen docusate sodium (COLACE) 100 MG capsule Take 100 mg by mouth 2 (two) times daily as needed for constipation.    . hydrochlorothiazide (HYDRODIURIL) 25 MG tablet TAKE 1 TABLET BY MOUTH EVERY DAY 90 tablet 3  . latanoprost (XALATAN) 0.005 % ophthalmic solution Place 1 drop into both eyes at bedtime.    Marland Kitchen loratadine (CLARITIN) 10 MG tablet Take 10 mg by mouth daily.    . Meclizine HCl (BONINE) 25 MG CHEW Chew 1 tablet by mouth 2 (two) times daily.    . Melatonin 1 MG TABS Take 1 tablet by mouth at bedtime.    . Multiple Vitamin (MULTIVITAMIN) capsule Take 1 capsule by mouth daily.    . naproxen sodium (ANAPROX) 220 MG tablet Take 220 mg by mouth 2 (two) times daily with a meal.    . Omega-3 Fatty Acids (FISH OIL PO) Take 1 capsule by mouth daily.    Marland Kitchen pyridOXINE (B-6) 50 MG tablet Take 50 mg by mouth daily.    . timolol (TIMOPTIC) 0.5 % ophthalmic solution Place 1 drop into both eyes as directed.    . Turmeric 500 MG CAPS Take by mouth.    . venlafaxine XR (EFFEXOR-XR) 75 MG 24 hr capsule Take 1 capsule (75  mg total) by mouth daily. 90 capsule 3   No current facility-administered medications on file prior to visit.    BP 138/68 mmHg  Pulse 54  Temp(Src) 98 F (36.7 C) (Oral)  Resp 20  Ht 5' 6.5" (1.689 m)  Wt 245 lb (111.131 kg)  BMI 38.96 kg/m2  SpO2 97%      Review of Systems  Constitutional: Negative.   HENT: Negative for congestion, dental problem, hearing loss, rhinorrhea, sinus pressure, sore throat and tinnitus.   Eyes: Negative for pain, discharge and visual disturbance.  Respiratory: Negative for cough and shortness of breath.   Cardiovascular: Negative for chest pain, palpitations and leg swelling.  Gastrointestinal: Negative for nausea, vomiting, abdominal pain, diarrhea, constipation, blood in stool and abdominal distention.  Genitourinary: Negative for dysuria, urgency, frequency, hematuria, flank pain, vaginal bleeding, vaginal discharge, difficulty urinating,  vaginal pain and pelvic pain.  Musculoskeletal: Negative for joint swelling, arthralgias and gait problem.  Skin: Negative for rash.  Neurological: Negative for dizziness, syncope, speech difficulty, weakness, numbness and headaches.  Hematological: Negative for adenopathy.  Psychiatric/Behavioral: Negative for behavioral problems, dysphoric mood and agitation. The patient is not nervous/anxious.        Objective:   Physical Exam  Constitutional: She is oriented to person, place, and time. She appears well-developed and well-nourished.  HENT:  Head: Normocephalic.  Right Ear: External ear normal.  Left Ear: External ear normal.  Mouth/Throat: Oropharynx is clear and moist.  Eyes: Conjunctivae and EOM are normal. Pupils are equal, round, and reactive to light.  Neck: Normal range of motion. Neck supple. No thyromegaly present.  Cardiovascular: Normal rate, regular rhythm, normal heart sounds and intact distal pulses.   Pulmonary/Chest: Effort normal and breath sounds normal.  Abdominal: Soft. Bowel sounds are normal. She exhibits no mass. There is no tenderness.  Musculoskeletal: Normal range of motion.  Lymphadenopathy:    She has no cervical adenopathy.  Neurological: She is alert and oriented to person, place, and time.  Skin: Skin is warm and dry. No rash noted.  Psychiatric: She has a normal mood and affect. Her behavior is normal.          Assessment & Plan:   Hypertension, well-controlled Dyslipidemia Malaria prophylaxis.  Prescription for chloroquine dispensed

## 2014-09-04 ENCOUNTER — Encounter: Payer: Self-pay | Admitting: Internal Medicine

## 2014-09-04 ENCOUNTER — Ambulatory Visit (INDEPENDENT_AMBULATORY_CARE_PROVIDER_SITE_OTHER): Payer: PPO | Admitting: Internal Medicine

## 2014-09-04 VITALS — BP 130/70 | HR 59 | Temp 98.3°F | Resp 20 | Ht 66.5 in | Wt 238.0 lb

## 2014-09-04 DIAGNOSIS — I421 Obstructive hypertrophic cardiomyopathy: Secondary | ICD-10-CM

## 2014-09-04 DIAGNOSIS — E785 Hyperlipidemia, unspecified: Secondary | ICD-10-CM

## 2014-09-04 NOTE — Progress Notes (Signed)
Subjective:    Patient ID: Susan Jenkins, female    DOB: [birth year], 69 y.o.   MRN: [id number]  HPI  69 year old patient who is seen today for follow-up.  She has been treated recently for left arm numbness thought to be related to a cervical radiculopathy.  The patient responded well to prednisone and meloxicam. She has had some situational stress due to family related issues.  A sister has been diagnosed and treated aggressively for breast cancer  Currently doing well  Past Medical History  Diagnosis Date  . Hyperlipidemia   . Hypertension   . Vitamin D deficiency   . Hemorrhoids   . Depression   . Pulmonary embolus 1971    following surgery  . HOCM (hypertrophic obstructive cardiomyopathy)     History   Social History  . Marital Status: Single    Spouse Name: N/A  . Number of Children: N/A  . Years of Education: N/A   Occupational History  . Not on file.   Social History Main Topics  . Smoking status: Former Smoker    Types: Cigarettes    Quit date: 05/03/1980  . Smokeless tobacco: Not on file  . Alcohol Use: Not on file  . Drug Use: Not on file  . Sexual Activity: Not on file   Other Topics Concern  . Not on file   Social History Narrative    Past Surgical History  Procedure Laterality Date  . Ankle surgery    . Knee surgery      No family history on file.  Allergies  Allergen Reactions  . Amoxicillin-Pot Clavulanate     REACTION: anaphylaxis    Current Outpatient Prescriptions on File Prior to Visit  Medication Sig Dispense Refill  . amLODipine (NORVASC) 10 MG tablet TAKE 1 TABLET BY MOUTH ONCE DAILY 30 tablet 5  . aspirin 81 MG tablet Take 81 mg by mouth daily.    Marland Kitchen atorvastatin (LIPITOR) 20 MG tablet Take 1 tablet (20 mg total) by mouth daily. 90 tablet 3  . Calcium Carbonate-Vit D-Min 600-400 MG-UNIT TABS Take 1 tablet by mouth daily.    . chloroquine (ARALEN) 250 MG tablet 1 tablet weekly, starting 1 week prior to departure and to  continue 4 weeks following return 10 tablet 0  . docusate sodium (COLACE) 100 MG capsule Take 100 mg by mouth 2 (two) times daily as needed for constipation.    . hydrochlorothiazide (HYDRODIURIL) 25 MG tablet TAKE 1 TABLET BY MOUTH EVERY DAY 90 tablet 3  . latanoprost (XALATAN) 0.005 % ophthalmic solution Place 1 drop into both eyes at bedtime.    Marland Kitchen loratadine (CLARITIN) 10 MG tablet Take 10 mg by mouth daily.    . Meclizine HCl (BONINE) 25 MG CHEW Chew 1 tablet by mouth 2 (two) times daily.    . Melatonin 1 MG TABS Take 1 tablet by mouth at bedtime.    . Multiple Vitamin (MULTIVITAMIN) capsule Take 1 capsule by mouth daily.    . naproxen sodium (ANAPROX) 220 MG tablet Take 220 mg by mouth 2 (two) times daily with a meal.    . Omega-3 Fatty Acids (FISH OIL PO) Take 1 capsule by mouth daily.    Marland Kitchen pyridOXINE (B-6) 50 MG tablet Take 50 mg by mouth daily.    . timolol (TIMOPTIC) 0.5 % ophthalmic solution Place 1 drop into both eyes as directed.    . venlafaxine XR (EFFEXOR-XR) 75 MG 24 hr capsule Take 1 capsule (75 mg  total) by mouth daily. 90 capsule 3   No current facility-administered medications on file prior to visit.    BP 130/70 mmHg  Pulse 59  Temp(Src) 98.3 F (36.8 C) (Oral)  Resp 20  Ht 5' 6.5" (1.689 m)  Wt 238 lb (107.956 kg)  BMI 37.84 kg/m2  SpO2 96%     Review of Systems  Constitutional: Negative.   HENT: Negative for congestion, dental problem, hearing loss, rhinorrhea, sinus pressure, sore throat and tinnitus.   Eyes: Negative for pain, discharge and visual disturbance.  Respiratory: Negative for cough and shortness of breath.   Cardiovascular: Negative for chest pain, palpitations and leg swelling.  Gastrointestinal: Negative for nausea, vomiting, abdominal pain, diarrhea, constipation, blood in stool and abdominal distention.  Genitourinary: Negative for dysuria, urgency, frequency, hematuria, flank pain, vaginal bleeding, vaginal discharge, difficulty urinating,  vaginal pain and pelvic pain.  Musculoskeletal: Negative for joint swelling, arthralgias and gait problem.  Skin: Negative for rash.  Neurological: Negative for dizziness, syncope, speech difficulty, weakness, numbness and headaches.  Hematological: Negative for adenopathy.  Psychiatric/Behavioral: Negative for behavioral problems, dysphoric mood and agitation. The patient is not nervous/anxious.        Objective:   Physical Exam  Constitutional: She is oriented to person, place, and time. She appears well-developed and well-nourished.  Blood pressure 120/70  HENT:  Head: Normocephalic.  Right Ear: External ear normal.  Left Ear: External ear normal.  Mouth/Throat: Oropharynx is clear and moist.  Eyes: Conjunctivae and EOM are normal. Pupils are equal, round, and reactive to light.  Neck: Normal range of motion. Neck supple. No thyromegaly present.  Cardiovascular: Normal rate, regular rhythm, normal heart sounds and intact distal pulses.   Pulmonary/Chest: Effort normal and breath sounds normal.  Abdominal: Soft. Bowel sounds are normal. She exhibits no mass. There is no tenderness.  Musculoskeletal: Normal range of motion.  Lymphadenopathy:    She has no cervical adenopathy.  Neurological: She is alert and oriented to person, place, and time.  Skin: Skin is warm and dry. No rash noted.  Psychiatric: She has a normal mood and affect. Her behavior is normal.          Assessment & Plan:   Dyslipidemia.  Continue statin therapy Hypertension, well-controlled  CPX 6 months

## 2014-09-04 NOTE — Patient Instructions (Signed)
Limit your sodium (Salt) intake  Please check your blood pressure on a regular basis.  If it is consistently greater than 150/90, please make an office appointment.  Return in 6 months for follow-up   

## 2014-09-04 NOTE — Progress Notes (Signed)
Pre visit review using our clinic review tool, if applicable. No additional management support is needed unless otherwise documented below in the visit note. 

## 2014-10-14 ENCOUNTER — Ambulatory Visit (INDEPENDENT_AMBULATORY_CARE_PROVIDER_SITE_OTHER): Payer: PPO | Admitting: Internal Medicine

## 2014-10-14 ENCOUNTER — Encounter: Payer: Self-pay | Admitting: Internal Medicine

## 2014-10-14 VITALS — BP 120/64 | HR 54 | Temp 98.2°F | Resp 20 | Ht 66.5 in | Wt 236.0 lb

## 2014-10-14 DIAGNOSIS — H6501 Acute serous otitis media, right ear: Secondary | ICD-10-CM | POA: Diagnosis not present

## 2014-10-14 MED ORDER — OFLOXACIN 0.3 % OT SOLN: 5.0000 [drp] | Freq: Two times a day (BID) | OTIC | Status: DC

## 2014-10-14 MED ORDER — AZITHROMYCIN 250 MG PO TABS: ORAL_TABLET | ORAL | Status: DC

## 2014-10-14 NOTE — Progress Notes (Signed)
Pre visit review using our clinic review tool, if applicable. No additional management support is needed unless otherwise documented below in the visit note. 

## 2014-10-14 NOTE — Patient Instructions (Signed)

## 2014-10-14 NOTE — Progress Notes (Signed)
Subjective:    Patient ID: Susan Jenkins, female    DOB: [birth year], 69 y.o.   MRN: [id number]  HPI 69 year old patient who has had a URI for several days.  Yesterday she had severe right ear pain and has noted a small amount of bloody drainage.  Pain has improved but is still aggravated by cough.  She began Cipro yesterday every 6 hours.  Her sinus symptoms have improved.  No fever, right ear pain is much improved  She does have a penicillin allergy which resulted in urticaria  Past Medical History  Diagnosis Date  . Hyperlipidemia   . Hypertension   . Vitamin D deficiency   . Hemorrhoids   . Depression   . Pulmonary embolus 1971    following surgery  . HOCM (hypertrophic obstructive cardiomyopathy)     History   Social History  . Marital Status: Single    Spouse Name: N/A  . Number of Children: N/A  . Years of Education: N/A   Occupational History  . Not on file.   Social History Main Topics  . Smoking status: Former Smoker    Types: Cigarettes    Quit date: 05/03/1980  . Smokeless tobacco: Not on file  . Alcohol Use: Not on file  . Drug Use: Not on file  . Sexual Activity: Not on file   Other Topics Concern  . Not on file   Social History Narrative    Past Surgical History  Procedure Laterality Date  . Ankle surgery    . Knee surgery      No family history on file.  Allergies  Allergen Reactions  . Amoxicillin-Pot Clavulanate     REACTION: anaphylaxis    Current Outpatient Prescriptions on File Prior to Visit  Medication Sig Dispense Refill  . amLODipine (NORVASC) 10 MG tablet TAKE 1 TABLET BY MOUTH ONCE DAILY 30 tablet 5  . aspirin 81 MG tablet Take 81 mg by mouth daily.    Marland Kitchen atorvastatin (LIPITOR) 20 MG tablet Take 1 tablet (20 mg total) by mouth daily. 90 tablet 3  . Calcium Carbonate-Vit D-Min 600-400 MG-UNIT TABS Take 1 tablet by mouth daily.    Marland Kitchen docusate sodium (COLACE) 100 MG capsule Take 100 mg by mouth 2 (two) times daily as needed  for constipation.    . hydrochlorothiazide (HYDRODIURIL) 25 MG tablet TAKE 1 TABLET BY MOUTH EVERY DAY 90 tablet 3  . latanoprost (XALATAN) 0.005 % ophthalmic solution Place 1 drop into both eyes at bedtime.    Marland Kitchen loratadine (CLARITIN) 10 MG tablet Take 10 mg by mouth daily.    . Meclizine HCl (BONINE) 25 MG CHEW Chew 1 tablet by mouth 2 (two) times daily.    . Melatonin 1 MG TABS Take 1 tablet by mouth at bedtime.    . Misc Natural Products (OSTEO BI-FLEX TRIPLE STRENGTH PO) Take 1 tablet by mouth 2 (two) times daily.    . Multiple Vitamin (MULTIVITAMIN) capsule Take 1 capsule by mouth daily.    . naproxen sodium (ANAPROX) 220 MG tablet Take 220 mg by mouth 2 (two) times daily with a meal.    . Omega-3 Fatty Acids (FISH OIL PO) Take 1 capsule by mouth daily.    Marland Kitchen pyridOXINE (B-6) 50 MG tablet Take 50 mg by mouth daily.    . timolol (TIMOPTIC) 0.5 % ophthalmic solution Place 1 drop into both eyes as directed.    . venlafaxine XR (EFFEXOR-XR) 75 MG 24 hr capsule Take 1  capsule (75 mg total) by mouth daily. 90 capsule 3   No current facility-administered medications on file prior to visit.    BP 120/64 mmHg  Pulse 54  Temp(Src) 98.2 F (36.8 C) (Oral)  Resp 20  Ht 5' 6.5" (1.689 m)  Wt 236 lb (107.049 kg)  BMI 37.53 kg/m2  SpO2 96%         Review of Systems  Constitutional: Negative.   HENT: Positive for ear pain, postnasal drip, rhinorrhea and sinus pressure. Negative for congestion, dental problem, hearing loss, sore throat and tinnitus.   Eyes: Negative for pain, discharge and visual disturbance.  Respiratory: Positive for cough. Negative for shortness of breath.   Cardiovascular: Negative for chest pain, palpitations and leg swelling.  Gastrointestinal: Negative for nausea, vomiting, abdominal pain, diarrhea, constipation, blood in stool and abdominal distention.  Genitourinary: Negative for dysuria, urgency, frequency, hematuria, flank pain, vaginal bleeding, vaginal  discharge, difficulty urinating, vaginal pain and pelvic pain.  Musculoskeletal: Negative for joint swelling, arthralgias and gait problem.  Skin: Negative for rash.  Neurological: Negative for dizziness, syncope, speech difficulty, weakness, numbness and headaches.  Hematological: Negative for adenopathy.  Psychiatric/Behavioral: Negative for behavioral problems, dysphoric mood and agitation. The patient is not nervous/anxious.        Objective:   Physical Exam  Constitutional: She is oriented to person, place, and time. She appears well-developed and well-nourished.  HENT:  Head: Normocephalic.  Right Ear: External ear normal.  Left Ear: External ear normal.  Mouth/Throat: Oropharynx is clear and moist.  Right tympanic membrane erythematous and slightly distorted A very tiny perforation was noted inferiorly  Eyes: Conjunctivae and EOM are normal. Pupils are equal, round, and reactive to light.  Neck: Normal range of motion. Neck supple. No thyromegaly present.  Cardiovascular: Normal rate, regular rhythm, normal heart sounds and intact distal pulses.   Pulmonary/Chest: Effort normal and breath sounds normal.  Abdominal: Soft. Bowel sounds are normal. She exhibits no mass. There is no tenderness.  Musculoskeletal: Normal range of motion.  Lymphadenopathy:    She has no cervical adenopathy.  Neurological: She is alert and oriented to person, place, and time.  Skin: Skin is warm and dry. No rash noted.  Psychiatric: She has a normal mood and affect. Her behavior is normal.          Assessment & Plan:   AOM.  Penicillin allergy with type I hypersensitivity We'll treat with azithromycin and topical Floxin otic

## 2014-10-22 ENCOUNTER — Telehealth: Payer: Self-pay | Admitting: Internal Medicine

## 2014-10-22 MED ORDER — SULFAMETHOXAZOLE-TRIMETHOPRIM 800-160 MG PO TABS: 1.0000 | ORAL_TABLET | Freq: Two times a day (BID) | ORAL | Status: DC

## 2014-10-22 NOTE — Telephone Encounter (Signed)
Spoke to pt, told her Rx for Bactrim DS one tablet twice a day x 10 days was sent to pharmacy. Pt verbalized understanding and asked if she should continue the ear drops? Asked pt how long has she been doing the drops? Pt said a week this past Monday. Told pt she can stop the drops and start new antibiotic. Pt verbalized understanding.

## 2014-10-22 NOTE — Telephone Encounter (Signed)
Please call in a new prescription for generic Bactrim DS No. 20 one twice a day

## 2014-10-22 NOTE — Telephone Encounter (Signed)
Please see message and advise 

## 2014-10-22 NOTE — Telephone Encounter (Signed)
Patient states her right ear is still "oozing" and would like to know what to do.  Dr. Raliegh Ip saw her on 10/14/14 for this.

## 2014-11-14 ENCOUNTER — Telehealth: Payer: Self-pay | Admitting: Internal Medicine

## 2014-11-14 NOTE — Telephone Encounter (Signed)
Please see message and advise 

## 2014-11-14 NOTE — Telephone Encounter (Signed)
Ok to switch to generic crestor 10

## 2014-11-14 NOTE — Telephone Encounter (Signed)
Pt states she believes the atorvastatin (LIPITOR) 20 MG tablet is giving her arm and leg muscle ache. This has been a gradual progression, but has gotten worse lately. Pt would like to go back on the Crestor 10mg  (generic ok) [facility] [address]

## 2014-11-15 MED ORDER — ROSUVASTATIN CALCIUM 10 MG PO TABS
10.0000 mg | ORAL_TABLET | Freq: Every day | ORAL | Status: DC
Start: 2014-11-15 — End: 2015-05-08

## 2014-11-15 NOTE — Telephone Encounter (Signed)
Needs Crestor called in to [facility] b/c she is out please.

## 2014-11-15 NOTE — Telephone Encounter (Signed)
Pt notified Rx sent to pharmacy

## 2015-02-21 ENCOUNTER — Other Ambulatory Visit: Payer: Self-pay | Admitting: Internal Medicine

## 2015-03-06 ENCOUNTER — Other Ambulatory Visit: Payer: PPO

## 2015-03-10 ENCOUNTER — Other Ambulatory Visit: Payer: Self-pay | Admitting: Orthopedic Surgery

## 2015-03-10 DIAGNOSIS — M542 Cervicalgia: Secondary | ICD-10-CM

## 2015-03-13 ENCOUNTER — Encounter: Payer: PPO | Admitting: Internal Medicine

## 2015-03-18 LAB — GLUCOSE, POCT (MANUAL RESULT ENTRY): POC Glucose: 86 mg/dl (ref 70–99)

## 2015-03-20 ENCOUNTER — Other Ambulatory Visit (INDEPENDENT_AMBULATORY_CARE_PROVIDER_SITE_OTHER): Payer: PPO

## 2015-03-20 DIAGNOSIS — Z0000 Encounter for general adult medical examination without abnormal findings: Secondary | ICD-10-CM

## 2015-03-20 LAB — LIPID PANEL
Cholesterol: 176 mg/dL (ref 0–200)
HDL: 68.7 mg/dL (ref >39.00)
LDL Cholesterol: 72 mg/dL (ref 0–99)
NONHDL: 107.61
Total CHOL/HDL Ratio: 3
Triglycerides: 178 mg/dL — ABNORMAL HIGH (ref 0.0–149.0)
VLDL: 35.6 mg/dL (ref 0.0–40.0)

## 2015-03-20 LAB — BASIC METABOLIC PANEL
BUN: 12 mg/dL (ref 6–23)
CALCIUM: 9.9 mg/dL (ref 8.4–10.5)
CO2: 33 meq/L — AB (ref 19–32)
CREATININE: 0.8 mg/dL (ref 0.40–1.20)
Chloride: 97 mEq/L (ref 96–112)
GFR: 76.04 mL/min (ref >60.00)
Glucose, Bld: 97 mg/dL (ref 70–99)
Potassium: 4.2 mEq/L (ref 3.5–5.1)
SODIUM: 138 meq/L (ref 135–145)

## 2015-03-20 LAB — CBC WITH DIFFERENTIAL/PLATELET
BASOS ABS: 0.1 10*3/uL (ref 0.0–0.1)
Basophils Relative: 0.8 % (ref 0.0–3.0)
EOS ABS: 0.2 10*3/uL (ref 0.0–0.7)
Eosinophils Relative: 2.6 % (ref 0.0–5.0)
HEMATOCRIT: 39.8 % (ref 36.0–46.0)
HEMOGLOBIN: 13.4 g/dL (ref 12.0–15.0)
LYMPHS PCT: 28.7 % (ref 12.0–46.0)
Lymphs Abs: 1.9 10*3/uL (ref 0.7–4.0)
MCHC: 33.7 g/dL (ref 30.0–36.0)
MCV: 99.1 fl (ref 78.0–100.0)
MONOS PCT: 11.5 % (ref 3.0–12.0)
Monocytes Absolute: 0.7 10*3/uL (ref 0.1–1.0)
Neutro Abs: 3.7 10*3/uL (ref 1.4–7.7)
Neutrophils Relative %: 56.4 % (ref 43.0–77.0)
PLATELETS: 287 10*3/uL (ref 150.0–400.0)
RBC: 4.02 Mil/uL (ref 3.87–5.11)
RDW: 13.3 % (ref 11.5–15.5)
WBC: 6.5 10*3/uL (ref 4.0–10.5)

## 2015-03-20 LAB — POCT URINALYSIS DIPSTICK
Bilirubin, UA: NEGATIVE
Glucose, UA: NEGATIVE
Leukocytes, UA: NEGATIVE
NITRITE UA: NEGATIVE
PH UA: 7
Spec Grav, UA: 1.02
UROBILINOGEN UA: 0.2

## 2015-03-20 LAB — HEPATIC FUNCTION PANEL
ALBUMIN: 4.3 g/dL (ref 3.5–5.2)
ALK PHOS: 61 U/L (ref 39–117)
ALT: 22 U/L (ref 0–35)
AST: 23 U/L (ref 0–37)
BILIRUBIN DIRECT: 0.1 mg/dL (ref 0.0–0.3)
TOTAL PROTEIN: 6.7 g/dL (ref 6.0–8.3)
Total Bilirubin: 0.4 mg/dL (ref 0.2–1.2)

## 2015-03-20 LAB — TSH: TSH: 4.54 u[IU]/mL — AB (ref 0.35–4.50)

## 2015-03-22 ENCOUNTER — Other Ambulatory Visit: Payer: PPO

## 2015-03-27 ENCOUNTER — Other Ambulatory Visit: Payer: Self-pay | Admitting: Internal Medicine

## 2015-04-01 ENCOUNTER — Other Ambulatory Visit: Payer: Self-pay | Admitting: *Deleted

## 2015-04-01 ENCOUNTER — Encounter: Payer: Self-pay | Admitting: Internal Medicine

## 2015-04-01 ENCOUNTER — Ambulatory Visit (INDEPENDENT_AMBULATORY_CARE_PROVIDER_SITE_OTHER): Payer: PPO | Admitting: Internal Medicine

## 2015-04-01 VITALS — BP 140/72 | HR 71 | Temp 98.4°F | Resp 20 | Ht 65.0 in | Wt 235.0 lb

## 2015-04-01 DIAGNOSIS — M1990 Unspecified osteoarthritis, unspecified site: Secondary | ICD-10-CM | POA: Diagnosis not present

## 2015-04-01 DIAGNOSIS — Z23 Encounter for immunization: Secondary | ICD-10-CM | POA: Diagnosis not present

## 2015-04-01 DIAGNOSIS — I10 Essential (primary) hypertension: Secondary | ICD-10-CM | POA: Diagnosis not present

## 2015-04-01 DIAGNOSIS — Z0000 Encounter for general adult medical examination without abnormal findings: Secondary | ICD-10-CM | POA: Diagnosis not present

## 2015-04-01 DIAGNOSIS — I421 Obstructive hypertrophic cardiomyopathy: Secondary | ICD-10-CM

## 2015-04-01 DIAGNOSIS — E785 Hyperlipidemia, unspecified: Secondary | ICD-10-CM

## 2015-04-01 MED ORDER — HYDROCHLOROTHIAZIDE 25 MG PO TABS: 25.0000 mg | ORAL_TABLET | Freq: Every day | ORAL | Status: DC

## 2015-04-01 NOTE — Patient Instructions (Signed)
Limit your sodium (Salt) intake    It is important that you exercise regularly, at least 20 minutes 3 to 4 times per week.  If you develop chest pain or shortness of breath seek  medical attention.  You need to lose weight.  Consider a lower calorie diet and regular exercise.  Menopause is a normal process in which your reproductive ability comes to an end. This process happens gradually over a span of months to years, usually between the ages of 12 and 55. Menopause is complete when you have missed 12 consecutive menstrual periods. It is important to talk with your health care provider about some of the most common conditions that affect postmenopausal women, such as heart disease, cancer, and bone loss (osteoporosis). Adopting a healthy lifestyle and getting preventive care can help to promote your health and wellness. Those actions can also lower your chances of developing some of these common conditions. WHAT SHOULD I KNOW ABOUT MENOPAUSE? During menopause, you may experience a number of symptoms, such as:  Moderate-to-severe hot flashes.  Night sweats.  Decrease in sex drive.  Mood swings.  Headaches.  Tiredness.  Irritability.  Memory problems.  Insomnia. Choosing to treat or not to treat menopausal changes is an individual decision that you make with your health care provider. WHAT SHOULD I KNOW ABOUT HORMONE REPLACEMENT THERAPY AND SUPPLEMENTS? Hormone therapy products are effective for treating symptoms that are associated with menopause, such as hot flashes and night sweats. Hormone replacement carries certain risks, especially as you become older. If you are thinking about using estrogen or estrogen with progestin treatments, discuss the benefits and risks with your health care provider. WHAT SHOULD I KNOW ABOUT HEART DISEASE AND STROKE? Heart disease, heart attack, and stroke become more likely as you age. This may be due, in part, to the hormonal changes that your body  experiences during menopause. These can affect how your body processes dietary fats, triglycerides, and cholesterol. Heart attack and stroke are both medical emergencies. There are many things that you can do to help prevent heart disease and stroke:  Have your blood pressure checked at least every 1-2 years. High blood pressure causes heart disease and increases the risk of stroke.  If you are 56-27 years old, ask your health care provider if you should take aspirin to prevent a heart attack or a stroke.  Do not use any tobacco products, including cigarettes, chewing tobacco, or electronic cigarettes. If you need help quitting, ask your health care provider.  It is important to eat a healthy diet and maintain a healthy weight.  Be sure to include plenty of vegetables, fruits, low-fat dairy products, and lean protein.  Avoid eating foods that are high in solid fats, added sugars, or salt (sodium).  Get regular exercise. This is one of the most important things that you can do for your health.  Try to exercise for at least 150 minutes each week. The type of exercise that you do should increase your heart rate and make you sweat. This is known as moderate-intensity exercise.  Try to do strengthening exercises at least twice each week. Do these in addition to the moderate-intensity exercise.  Know your numbers.Ask your health care provider to check your cholesterol and your blood glucose. Continue to have your blood tested as directed by your health care provider. WHAT SHOULD I KNOW ABOUT CANCER SCREENING? There are several types of cancer. Take the following steps to reduce your risk and to catch any cancer  development as early as possible. Breast Cancer  Practice breast self-awareness.  This means understanding how your breasts normally appear and feel.  It also means doing regular breast self-exams. Let your health care provider know about any changes, no matter how small.  If you  are 41 or older, have a clinician do a breast exam (clinical breast exam or CBE) every year. Depending on your age, family history, and medical history, it may be recommended that you also have a yearly breast X-ray (mammogram).  If you have a family history of breast cancer, talk with your health care provider about genetic screening.  If you are at high risk for breast cancer, talk with your health care provider about having an MRI and a mammogram every year.  Breast cancer (BRCA) gene test is recommended for women who have family members with BRCA-related cancers. Results of the assessment will determine the need for genetic counseling and BRCA1 and for BRCA2 testing. BRCA-related cancers include these types:  Breast. This occurs in males or females.  Ovarian.  Tubal. This may also be called fallopian tube cancer.  Cancer of the abdominal or pelvic lining (peritoneal cancer).  Prostate.  Pancreatic. Cervical, Uterine, and Ovarian Cancer Your health care provider may recommend that you be screened regularly for cancer of the pelvic organs. These include your ovaries, uterus, and vagina. This screening involves a pelvic exam, which includes checking for microscopic changes to the surface of your cervix (Pap test).  For women ages 21-65, health care providers may recommend a pelvic exam and a Pap test every three years. For women ages 14-65, they may recommend the Pap test and pelvic exam, combined with testing for human papilloma virus (HPV), every five years. Some types of HPV increase your risk of cervical cancer. Testing for HPV may also be done on women of any age who have unclear Pap test results.  Other health care providers may not recommend any screening for nonpregnant women who are considered low risk for pelvic cancer and have no symptoms. Ask your health care provider if a screening pelvic exam is right for you.  If you have had past treatment for cervical cancer or a condition  that could lead to cancer, you need Pap tests and screening for cancer for at least 20 years after your treatment. If Pap tests have been discontinued for you, your risk factors (such as having a new sexual partner) need to be reassessed to determine if you should start having screenings again. Some women have medical problems that increase the chance of getting cervical cancer. In these cases, your health care provider may recommend that you have screening and Pap tests more often.  If you have a family history of uterine cancer or ovarian cancer, talk with your health care provider about genetic screening.  If you have vaginal bleeding after reaching menopause, tell your health care provider.  There are currently no reliable tests available to screen for ovarian cancer. Lung Cancer Lung cancer screening is recommended for adults 92-3 years old who are at high risk for lung cancer because of a history of smoking. A yearly low-dose CT scan of the lungs is recommended if you:  Currently smoke.  Have a history of at least 30 pack-years of smoking and you currently smoke or have quit within the past 15 years. A pack-year is smoking an average of one pack of cigarettes per day for one year. Yearly screening should:  Continue until it has been 15  years since you quit.  Stop if you develop a health problem that would prevent you from having lung cancer treatment. Colorectal Cancer  This type of cancer can be detected and can often be prevented.  Routine colorectal cancer screening usually begins at age 56 and continues through age 43.  If you have risk factors for colon cancer, your health care provider may recommend that you be screened at an earlier age.  If you have a family history of colorectal cancer, talk with your health care provider about genetic screening.  Your health care provider may also recommend using home test kits to check for hidden blood in your stool.  A small camera at  the end of a tube can be used to examine your colon directly (sigmoidoscopy or colonoscopy). This is done to check for the earliest forms of colorectal cancer.  Direct examination of the colon should be repeated every 5-10 years until age 18. However, if early forms of precancerous polyps or small growths are found or if you have a family history or genetic risk for colorectal cancer, you may need to be screened more often. Skin Cancer  Check your skin from head to toe regularly.  Monitor any moles. Be sure to tell your health care provider:  About any new moles or changes in moles, especially if there is a change in a mole's shape or color.  If you have a mole that is larger than the size of a pencil eraser.  If any of your family members has a history of skin cancer, especially at a young age, talk with your health care provider about genetic screening.  Always use sunscreen. Apply sunscreen liberally and repeatedly throughout the day.  Whenever you are outside, protect yourself by wearing long sleeves, pants, a wide-brimmed hat, and sunglasses. WHAT SHOULD I KNOW ABOUT OSTEOPOROSIS? Osteoporosis is a condition in which bone destruction happens more quickly than new bone creation. After menopause, you may be at an increased risk for osteoporosis. To help prevent osteoporosis or the bone fractures that can happen because of osteoporosis, the following is recommended:  If you are 56-55 years old, get at least 1,000 mg of calcium and at least 600 mg of vitamin D per day.  If you are older than age 41 but younger than age 36, get at least 1,200 mg of calcium and at least 600 mg of vitamin D per day.  If you are older than age 29, get at least 1,200 mg of calcium and at least 800 mg of vitamin D per day. Smoking and excessive alcohol intake increase the risk of osteoporosis. Eat foods that are rich in calcium and vitamin D, and do weight-bearing exercises several times each week as directed by  your health care provider. WHAT SHOULD I KNOW ABOUT HOW MENOPAUSE AFFECTS [place]? Depression may occur at any age, but it is more common as you become older. Common symptoms of depression include:  Low or sad mood.  Changes in sleep patterns.  Changes in appetite or eating patterns.  Feeling an overall lack of motivation or enjoyment of activities that you previously enjoyed.  Frequent crying spells. Talk with your health care provider if you think that you are experiencing depression. WHAT SHOULD I KNOW ABOUT IMMUNIZATIONS? It is important that you get and maintain your immunizations. These include:  Tetanus, diphtheria, and pertussis (Tdap) booster vaccine.  Influenza every year before the flu season begins.  Pneumonia vaccine.  Shingles vaccine. Your health  care provider may also recommend other immunizations.   This information is not intended to replace advice given to you by your health care provider. Make sure you discuss any questions you have with your health care provider.   Document Released: 06/11/2005 Document Revised: 05/10/2014 Document Reviewed: 12/20/2013 Elsevier Interactive Patient Education [employer].

## 2015-04-01 NOTE — Progress Notes (Signed)
Subjective:    Patient ID: Susan Jenkins, female    DOB: 1945/05/11, 69 y.o.   MRN: [id number]  HPI   69 year-old patient who is seen today for a preventive health examination She does quite well.  She does have a history of glaucoma and is seen by ophthalmology.  3 times per year.  She is also seen by orthopedics periodically.  She has a history of mild hypertrophic cardiomyopathy and has done quite well.  She has essential hypertension and mild dyslipidemia.  She has resumed her Crestor Only complaints today are orthopedic.  She has had some neck and shoulder discomfort as well as some paresthesias involving the left arm.  She is scheduled for a cervical epidural   Colonoscopy 2014 Last Pap 11/15 Mammogram 12/15  Past Medical History  Diagnosis Date  . Hyperlipidemia   . Hypertension   . Vitamin D deficiency   . Hemorrhoids   . Depression   . Pulmonary embolus ([place]) 1971    following surgery  . HOCM (hypertrophic obstructive cardiomyopathy) ([place])     Social History   Social History  . Marital Status: Single    Spouse Name: N/A  . Number of Children: N/A  . Years of Education: N/A   Occupational History  . Not on file.   Social History Main Topics  . Smoking status: Former Smoker    Types: Cigarettes    Quit date: 05/03/1980  . Smokeless tobacco: Not on file  . Alcohol Use: Not on file  . Drug Use: Not on file  . Sexual Activity: Not on file   Other Topics Concern  . Not on file   Social History Narrative    Past Surgical History  Procedure Laterality Date  . Ankle surgery    . Knee surgery      No family history on file.  Allergies  Allergen Reactions  . Amoxicillin-Pot Clavulanate     REACTION: anaphylaxis    Current Outpatient Prescriptions on File Prior to Visit  Medication Sig Dispense Refill  . amLODipine (NORVASC) 10 MG tablet TAKE 1 TABLET BY MOUTH ONCE DAILY 30 tablet 5  . amLODipine (NORVASC) 10 MG tablet TAKE 1 TABLET BY MOUTH  EVERY DAY 90 tablet 3  . aspirin 81 MG tablet Take 81 mg by mouth daily.    Marland Kitchen atorvastatin (LIPITOR) 20 MG tablet Take 1 tablet (20 mg total) by mouth daily. 90 tablet 3  . Calcium Carbonate-Vit D-Min 600-400 MG-UNIT TABS Take 1 tablet by mouth daily.    . ciprofloxacin (CIPRO) 500 MG tablet Take 500 mg by mouth 2 (two) times daily.    Marland Kitchen docusate sodium (COLACE) 100 MG capsule Take 100 mg by mouth 2 (two) times daily as needed for constipation.    . hydrochlorothiazide (HYDRODIURIL) 25 MG tablet TAKE 1 TABLET BY MOUTH EVERY DAY 90 tablet 3  . latanoprost (XALATAN) 0.005 % ophthalmic solution Place 1 drop into both eyes at bedtime.    Marland Kitchen loratadine (CLARITIN) 10 MG tablet Take 10 mg by mouth daily.    . Meclizine HCl (BONINE) 25 MG CHEW Chew 1 tablet by mouth 2 (two) times daily.    . Melatonin 1 MG TABS Take 1 tablet by mouth at bedtime.    . Misc Natural Products (OSTEO BI-FLEX TRIPLE STRENGTH PO) Take 1 tablet by mouth 2 (two) times daily.    . Multiple Vitamin (MULTIVITAMIN) capsule Take 1 capsule by mouth daily.    . naproxen sodium (  ANAPROX) 220 MG tablet Take 220 mg by mouth 2 (two) times daily with a meal.    . ofloxacin (FLOXIN) 0.3 % otic solution Place 5 drops into the right ear 2 (two) times daily. 5 mL 0  . Omega-3 Fatty Acids (FISH OIL PO) Take 1 capsule by mouth daily.    Marland Kitchen pyridOXINE (B-6) 50 MG tablet Take 50 mg by mouth daily.    . rosuvastatin (CRESTOR) 10 MG tablet Take 1 tablet (10 mg total) by mouth daily. 90 tablet 1  . sulfamethoxazole-trimethoprim (BACTRIM DS) 800-160 MG per tablet Take 1 tablet by mouth 2 (two) times daily. 20 tablet 0  . timolol (TIMOPTIC) 0.5 % ophthalmic solution Place 1 drop into both eyes as directed.    . venlafaxine XR (EFFEXOR-XR) 75 MG 24 hr capsule TAKE ONE CAPSULE BY MOUTH EVERY DAY 90 capsule 3   No current facility-administered medications on file prior to visit.    There were no vitals taken for this visit.   1. Risk factors, based on  past  M,S,F history.  Cardiovascular risk factors include hypertension and dyslipidemia  2.  Physical activities:remains quite active, although limited somewhat by right knee pain.  Participates in [employer] and softball  3.  Depression/mood:history of mild depression, which has been well controlled 4.  Hearing:  5.  ADL's:independent in all aspects of daily living  6.  Fall risk:low  7.  Home safety:no problems identified  8.  Height weight, and visual acuity;height and weight stable.  Followed by ophthalmology.  4 times per year due to glaucoma  9.  Counseling:heart healthy diet, modest weight loss, regular exercise, all encouraged  10. Lab orders based on risk factors:laboratory profile including lipid panel reviewed  11. Referral :follow-up ophthalmology and orthopedics  12. Care plan:heart healthy diet, modest weight loss encouraged  13. Cognitive assessment: alert and oriented with normal affect.  No cognitive dysfunction  14. Screening:we'll continue to receive the annual mammograms.  A sister recently diagnosed with breast cancer will receive annual preventive care.  Examinations.  Will be followed by ophthalmology.  Quarterly and orthopedics. Patient was provided with a written and personalized care plan  15. Provider List Update: ophthalmology, orthopedics, primary care cardiology    Review of Systems  Constitutional: Negative for fever, appetite change, fatigue and unexpected weight change.  HENT: Negative for congestion, dental problem, ear pain, hearing loss, mouth sores, nosebleeds, sinus pressure, sore throat, tinnitus, trouble swallowing and voice change.   Eyes: Negative for photophobia, pain, redness and visual disturbance.  Respiratory: Negative for cough, chest tightness and shortness of breath.   Cardiovascular: Negative for chest pain, palpitations and leg swelling.  Gastrointestinal: Negative for nausea, vomiting, abdominal pain, diarrhea,  constipation, blood in stool, abdominal distention and rectal pain.  Genitourinary: Negative for dysuria, urgency, frequency, hematuria, flank pain, vaginal bleeding, vaginal discharge, difficulty urinating, genital sores, vaginal pain, menstrual problem and pelvic pain.  Musculoskeletal: Negative for back pain, arthralgias and neck stiffness.       Right knee pain  Skin: Negative for rash.  Neurological: Negative for dizziness, syncope, speech difficulty, weakness, light-headedness, numbness and headaches.  Hematological: Negative for adenopathy. Does not bruise/bleed easily.  Psychiatric/Behavioral: Negative for suicidal ideas, behavioral problems, self-injury, dysphoric mood and agitation. The patient is not nervous/anxious.        Objective:   Physical Exam  Constitutional: She is oriented to person, place, and time. She appears well-developed and well-nourished.  Weight 235  Blood pressure low  normal  HENT:  Head: Normocephalic and atraumatic.  Right Ear: External ear normal.  Left Ear: External ear normal.  Mouth/Throat: Oropharynx is clear and moist.  Eyes: Conjunctivae and EOM are normal.  Neck: Normal range of motion. Neck supple. No JVD present. No thyromegaly present.  Cardiovascular: Normal rate, regular rhythm and intact distal pulses.   Murmur heard. Grade 2/6 systolic murmur at the base  Pulmonary/Chest: Effort normal and breath sounds normal. She has no wheezes. She has no rales.  Abdominal: Soft. Bowel sounds are normal. She exhibits no distension and no mass. There is no tenderness. There is no rebound and no guarding.  Genitourinary: Vagina normal and uterus normal. No vaginal discharge found.  Musculoskeletal: Normal range of motion. She exhibits no edema or tenderness.  Surgical scars, left medial ankle and right knee  Neurological: She is alert and oriented to person, place, and time. She has normal reflexes. No cranial nerve deficit. She exhibits normal muscle  tone. Coordination normal.  Skin: Skin is warm and dry. No rash noted.  Psychiatric: She has a normal mood and affect. Her behavior is normal.          Assessment & Plan:   Preventive health examination Hypertension, well-controlled Dyslipidemia. Osteoarthritis Glaucoma.  Follow-up ophthalmology  Recheck 6 months

## 2015-04-01 NOTE — Progress Notes (Signed)
Pre visit review using our clinic review tool, if applicable. No additional management support is needed unless otherwise documented below in the visit note. 

## 2015-04-29 ENCOUNTER — Other Ambulatory Visit: Payer: Self-pay | Admitting: Internal Medicine

## 2015-05-08 ENCOUNTER — Other Ambulatory Visit: Payer: Self-pay | Admitting: Internal Medicine

## 2015-05-08 DIAGNOSIS — M25511 Pain in right shoulder: Secondary | ICD-10-CM | POA: Diagnosis not present

## 2015-05-08 DIAGNOSIS — M542 Cervicalgia: Secondary | ICD-10-CM | POA: Diagnosis not present

## 2015-05-08 DIAGNOSIS — M25512 Pain in left shoulder: Secondary | ICD-10-CM | POA: Diagnosis not present

## 2015-05-08 DIAGNOSIS — M79605 Pain in left leg: Secondary | ICD-10-CM | POA: Diagnosis not present

## 2015-05-15 DIAGNOSIS — M79605 Pain in left leg: Secondary | ICD-10-CM | POA: Diagnosis not present

## 2015-05-15 DIAGNOSIS — M25512 Pain in left shoulder: Secondary | ICD-10-CM | POA: Diagnosis not present

## 2015-05-15 DIAGNOSIS — M25511 Pain in right shoulder: Secondary | ICD-10-CM | POA: Diagnosis not present

## 2015-05-15 DIAGNOSIS — M542 Cervicalgia: Secondary | ICD-10-CM | POA: Diagnosis not present

## 2015-05-19 DIAGNOSIS — M25511 Pain in right shoulder: Secondary | ICD-10-CM | POA: Diagnosis not present

## 2015-05-19 DIAGNOSIS — M79605 Pain in left leg: Secondary | ICD-10-CM | POA: Diagnosis not present

## 2015-05-20 DIAGNOSIS — M79605 Pain in left leg: Secondary | ICD-10-CM | POA: Diagnosis not present

## 2015-05-20 DIAGNOSIS — M25512 Pain in left shoulder: Secondary | ICD-10-CM | POA: Diagnosis not present

## 2015-05-20 DIAGNOSIS — M25511 Pain in right shoulder: Secondary | ICD-10-CM | POA: Diagnosis not present

## 2015-05-20 DIAGNOSIS — M542 Cervicalgia: Secondary | ICD-10-CM | POA: Diagnosis not present

## 2015-05-29 DIAGNOSIS — M79605 Pain in left leg: Secondary | ICD-10-CM | POA: Diagnosis not present

## 2015-05-29 DIAGNOSIS — M542 Cervicalgia: Secondary | ICD-10-CM | POA: Diagnosis not present

## 2015-05-29 DIAGNOSIS — M25511 Pain in right shoulder: Secondary | ICD-10-CM | POA: Diagnosis not present

## 2015-05-29 DIAGNOSIS — Z803 Family history of malignant neoplasm of breast: Secondary | ICD-10-CM | POA: Diagnosis not present

## 2015-05-29 DIAGNOSIS — M25512 Pain in left shoulder: Secondary | ICD-10-CM | POA: Diagnosis not present

## 2015-05-29 DIAGNOSIS — Z1231 Encounter for screening mammogram for malignant neoplasm of breast: Secondary | ICD-10-CM | POA: Diagnosis not present

## 2015-05-29 LAB — HM MAMMOGRAPHY: HM Mammogram: NEGATIVE

## 2015-06-04 DIAGNOSIS — H401122 Primary open-angle glaucoma, left eye, moderate stage: Secondary | ICD-10-CM | POA: Diagnosis not present

## 2015-06-04 DIAGNOSIS — H401112 Primary open-angle glaucoma, right eye, moderate stage: Secondary | ICD-10-CM | POA: Diagnosis not present

## 2015-06-05 DIAGNOSIS — M542 Cervicalgia: Secondary | ICD-10-CM | POA: Diagnosis not present

## 2015-06-05 DIAGNOSIS — M79605 Pain in left leg: Secondary | ICD-10-CM | POA: Diagnosis not present

## 2015-06-05 DIAGNOSIS — M25512 Pain in left shoulder: Secondary | ICD-10-CM | POA: Diagnosis not present

## 2015-06-05 DIAGNOSIS — M25511 Pain in right shoulder: Secondary | ICD-10-CM | POA: Diagnosis not present

## 2015-06-16 ENCOUNTER — Encounter: Payer: Self-pay | Admitting: Internal Medicine

## 2015-09-01 DIAGNOSIS — Z8719 Personal history of other diseases of the digestive system: Secondary | ICD-10-CM

## 2015-09-01 HISTORY — DX: Personal history of other diseases of the digestive system: Z87.19

## 2015-09-17 DIAGNOSIS — H401112 Primary open-angle glaucoma, right eye, moderate stage: Secondary | ICD-10-CM | POA: Diagnosis not present

## 2015-09-17 DIAGNOSIS — H401122 Primary open-angle glaucoma, left eye, moderate stage: Secondary | ICD-10-CM | POA: Diagnosis not present

## 2015-09-24 ENCOUNTER — Ambulatory Visit (INDEPENDENT_AMBULATORY_CARE_PROVIDER_SITE_OTHER): Payer: PPO | Admitting: Family Medicine

## 2015-09-24 ENCOUNTER — Ambulatory Visit (INDEPENDENT_AMBULATORY_CARE_PROVIDER_SITE_OTHER): Payer: PPO

## 2015-09-24 VITALS — BP 118/72 | HR 57 | Temp 98.0°F | Resp 18 | Ht 65.0 in | Wt 237.4 lb

## 2015-09-24 DIAGNOSIS — S93602A Unspecified sprain of left foot, initial encounter: Secondary | ICD-10-CM | POA: Diagnosis not present

## 2015-09-24 DIAGNOSIS — S99922A Unspecified injury of left foot, initial encounter: Secondary | ICD-10-CM | POA: Diagnosis not present

## 2015-09-24 DIAGNOSIS — M79672 Pain in left foot: Secondary | ICD-10-CM | POA: Diagnosis not present

## 2015-09-24 DIAGNOSIS — J329 Chronic sinusitis, unspecified: Secondary | ICD-10-CM

## 2015-09-24 DIAGNOSIS — J40 Bronchitis, not specified as acute or chronic: Secondary | ICD-10-CM | POA: Diagnosis not present

## 2015-09-24 MED ORDER — AZITHROMYCIN 250 MG PO TABS: ORAL_TABLET | ORAL | Status: DC

## 2015-09-24 MED ORDER — HYDROCODONE-HOMATROPINE 5-1.5 MG/5ML PO SYRP: 5.0000 mL | ORAL_SOLUTION | Freq: Three times a day (TID) | ORAL | Status: DC | PRN

## 2015-09-24 NOTE — Patient Instructions (Addendum)
IF you received an x-ray today, you will receive an invoice from [facility]. Please contact [facility] at [phone] with questions or concerns regarding your invoice.   IF you received labwork today, you will receive an invoice from [employer]. Please contact [facility] at [phone] with questions or concerns regarding your invoice.   Our billing staff will not be able to assist you with questions regarding bills from these companies.  You will be contacted with the lab results as soon as they are available. The fastest way to get your results is to activate your My Chart account. Instructions are located on the last page of this paperwork. If you have not heard from [place] regarding the results in 2 weeks, please contact this office.     Foot Sprain A foot sprain is an injury to one of the strong bands of tissue (ligaments) that connect and support the many bones in your feet. The ligament can be stretched too much or it can tear. A tear can be either partial or complete. The severity of the sprain depends on how much of the ligament was damaged or torn. CAUSES A foot sprain is usually caused by suddenly twisting or pivoting your foot. RISK FACTORS This injury is more likely to occur in people who:  Play a sport, such as basketball or football.  Exercise or play a sport without warming up.  Start a new workout or sport.  Suddenly increase how long or hard they exercise or play a sport. SYMPTOMS Symptoms of this condition start soon after an injury and include:  Pain, especially in the arch of the foot.  Bruising.  Swelling.  Inability to walk or use the foot to support body weight. DIAGNOSIS This condition is diagnosed with a medical history and physical exam. You may also have imaging tests, such as:  X-rays to make sure there are no broken bones (fractures).  MRI to see if the ligament has torn. TREATMENT Treatment  varies depending on the severity of your sprain. Mild sprains can be treated with rest, ice, compression, and elevation (RICE). If your ligament is overstretched or partially torn, treatment usually involves keeping your foot in a fixed position (immobilization) for a period of time. To help you do this, your health care provider will apply a bandage, splint, or walking boot to keep your foot from moving until it heals. You may also be advised to use crutches or a scooter for a few weeks to avoid bearing weight on your foot while it is healing. If your ligament is fully torn, you may need surgery to reconnect the ligament to the bone. After surgery, a cast or splint will be applied and will need to stay on your foot while it heals. Your health care provider may also suggest exercises or physical therapy to strengthen your foot. HOME CARE INSTRUCTIONS If You Have a Bandage, Splint, or Walking Boot:  Wear it as directed by your health care provider. Remove it only as directed by your health care provider.  Loosen the bandage, splint, or walking boot if your toes become numb and tingle, or if they turn cold and blue. Bathing  If your health care provider approves bathing and showering, cover the bandage or splint with a watertight plastic bag to protect it from water. Do not let the bandage or splint get wet. Managing Pain, Stiffness, and Swelling   If directed, apply ice to the injured area:  Put ice in  a plastic bag.  Place a towel between your skin and the bag.  Leave the ice on for 20 minutes, 2-3 times per day.  Move your toes often to avoid stiffness and to lessen swelling.  Raise (elevate) the injured area above the level of your heart while you are sitting or lying down. Driving  Do not drive or operate heavy machinery while taking pain medicine.  Do not drive while wearing a bandage, splint, or walking boot on a foot that you use for driving. Activity  Rest as directed by your  health care provider.  Do not use the injured foot to support your body weight until your health care provider says that you can. Use crutches or other supportive devices as directed by your health care provider.  Ask your health care provider what activities are safe for you. Gradually increase how much and how far you walk until your health care provider says it is safe to return to full activity.  Do any exercise or physical therapy as directed by your health care provider. General Instructions  If a splint was applied, do not put pressure on any part of it until it is fully hardened. This may take several hours.  Take medicines only as directed by your health care provider. These include over-the-counter medicines and prescription medicines.  Keep all follow-up visits as directed by your health care provider. This is important.  When you can walk without pain, wear supportive shoes that have stiff soles. Do not wear flip-flops, and do not walk barefoot. SEEK MEDICAL CARE IF:  Your pain is not controlled with medicine.  Your bruising or swelling gets worse or does not get better with treatment.  Your splint or walking boot is damaged. SEEK IMMEDIATE MEDICAL CARE IF:  Your foot is numb or blue.  Your foot feels colder than normal.   This information is not intended to replace advice given to you by your health care provider. Make sure you discuss any questions you have with your health care provider.   Document Released: 10/09/2001 Document Revised: 09/03/2014 Document Reviewed: 02/20/2014 Elsevier Interactive Patient Education [employer].

## 2015-09-24 NOTE — Progress Notes (Signed)
Subjective:  By signing my name below, I, Raven Small, attest that this documentation has been prepared under the direction and in the presence of Delman Cheadle, MD.  Electronically Signed: Thea Alken, ED Scribe. 09/24/2015. 2:08 PM.    Patient ID: Susan Jenkins, female    DOB: 1945/12/03, 70 y.o.   MRN: [id number]  HPI Chief Complaint  Patient presents with  . Cough    x1.5 weeks, OTC meds are not helping. Productice cough with brownish/yellowish phlegm  . Foot Injury    Left foot, bruise on the top of foot. Sensitive to touch. x2 weeks    HPI Comments: Susan Jenkins is a 70 y.o. female who presents to the [facility] complaining of cough that began 10 days ago. Pt states symptoms started with sinus pressure and nasal discharge which has improved. Her symptoms now consist of a productive cough consisting of yellow/brown phlegm, chest congestion, mild sore throat, and mild post nasal drip. She reports having a fever last night. She has been taking mucinex and delsym with minimal relief to symptoms. Pt is allergic to amoxicillin.   Pt also c/o left foot pain dorsally that began 2 weeks ago. At that time she recalls stepping off of a boat and onto some rocks. She has taken ibuprofen, aleve as ice and elevation.    Patient Active Problem List   Diagnosis Date Noted  . Family history of cancer of GI tract 08/29/2013  . HOCM (hypertrophic obstructive cardiomyopathy) ([place]) 06/11/2010  . OTHER SPECIFIED DISORDERS OF LIVER 05/07/2010  . UNSPECIFIED VITAMIN D DEFICIENCY 03/17/2010  . Hyperlipemia 03/17/2010  . DEPRESSION 03/17/2010  . GLUCOMA 03/17/2010  . DIPLOPIA 03/17/2010  . Essential hypertension 03/17/2010  . MITRAL VALVE PROLAPSE 03/17/2010  . UNSPEC HEMORRHOIDS WITHOUT MENTION COMPLICATION 123XX123  . BLOOD IN STOOL 03/17/2010  . JOINT PAIN, HAND 03/17/2010  . SCIATICA 03/17/2010  . BACK PAIN 03/17/2010  . CHEST PAIN 03/17/2010   Past Medical History    Diagnosis Date  . Hyperlipidemia   . Hypertension   . Vitamin D deficiency   . Hemorrhoids   . Depression   . Pulmonary embolus ([place]) 1971    following surgery  . HOCM (hypertrophic obstructive cardiomyopathy) ([place])   . Glaucoma    Past Surgical History  Procedure Laterality Date  . Ankle surgery    . Knee surgery     Allergies  Allergen Reactions  . Amoxicillin-Pot Clavulanate     REACTION: anaphylaxis   Prior to Admission medications   Medication Sig Start Date End Date Taking? Authorizing Provider  amLODipine (NORVASC) 10 MG tablet TAKE 1 TABLET BY MOUTH ONCE DAILY 03/08/14  Yes Marletta Lor, MD  amLODipine (NORVASC) 10 MG tablet TAKE 1 TABLET BY MOUTH EVERY DAY 02/21/15  Yes Marletta Lor, MD  aspirin 81 MG tablet Take 81 mg by mouth daily.   Yes Historical Provider, MD  Calcium Carbonate-Vit D-Min 600-400 MG-UNIT TABS Take 1 tablet by mouth daily.   Yes Historical Provider, MD  docusate sodium (COLACE) 100 MG capsule Take 100 mg by mouth 2 (two) times daily as needed for constipation.   Yes Historical Provider, MD  hydrochlorothiazide (HYDRODIURIL) 25 MG tablet Take 1 tablet (25 mg total) by mouth daily. 04/01/15  Yes Marletta Lor, MD  latanoprost (XALATAN) 0.005 % ophthalmic solution Place 1 drop into both eyes at bedtime.   Yes Historical Provider, MD  loratadine (CLARITIN) 10 MG tablet Take 10 mg  by mouth daily.   Yes Historical Provider, MD  Misc Natural Products (OSTEO BI-FLEX TRIPLE STRENGTH PO) Take 1 tablet by mouth 2 (two) times daily.   Yes Historical Provider, MD  Multiple Vitamin (MULTIVITAMIN) capsule Take 1 capsule by mouth daily.   Yes Historical Provider, MD  Omega-3 Fatty Acids (FISH OIL PO) Take 1 capsule by mouth daily.   Yes Historical Provider, MD  pyridOXINE (B-6) 50 MG tablet Take 50 mg by mouth daily.   Yes Historical Provider, MD  timolol (TIMOPTIC) 0.5 % ophthalmic solution Place 1 drop into both eyes as directed.   Yes Historical  Provider, MD  venlafaxine XR (EFFEXOR-XR) 75 MG 24 hr capsule TAKE ONE CAPSULE BY MOUTH EVERY DAY 02/21/15  Yes Marletta Lor, MD  ciprofloxacin (CIPRO) 500 MG tablet Take 500 mg by mouth 2 (two) times daily. Reported on 09/24/2015    Historical Provider, MD  Meclizine HCl (BONINE) 25 MG CHEW Chew 1 tablet by mouth 2 (two) times daily. Reported on 09/24/2015    Historical Provider, MD  Melatonin 1 MG TABS Take 1 tablet by mouth at bedtime.    Historical Provider, MD  meloxicam (MOBIC) 15 MG tablet Reported on 09/24/2015 03/16/15   Historical Provider, MD  rosuvastatin (CRESTOR) 10 MG tablet TAKE 1 TABLET BY MOUTH EVERY DAY Patient not taking: Reported on 09/24/2015 05/09/15   Marletta Lor, MD  traMADol Veatrice Bourbon) 50 MG tablet Reported on 09/24/2015 03/17/15   Historical Provider, MD   Social History   Social History  . Marital Status: Single    Spouse Name: N/A  . Number of Children: N/A  . Years of Education: N/A   Occupational History  . Not on file.   Social History Main Topics  . Smoking status: Former Smoker    Types: Cigarettes    Quit date: 05/03/1980  . Smokeless tobacco: Not on file  . Alcohol Use: Not on file  . Drug Use: Not on file  . Sexual Activity: Not on file   Other Topics Concern  . Not on file   Social History Narrative   Review of Systems  Constitutional: Positive for fever. Negative for chills.  HENT: Positive for congestion, postnasal drip, sinus pressure and sore throat.   Respiratory: Positive for cough and shortness of breath.   Musculoskeletal: Positive for myalgias. Negative for gait problem.  Skin: Positive for color change. Negative for wound.  Hematological: Bruises/bleeds easily.    Objective:   Physical Exam  Constitutional: She is oriented to person, place, and time. She appears well-developed and well-nourished. No distress.  HENT:  Head: Normocephalic and atraumatic.  Right Ear: Tympanic membrane is injected and retracted. A  middle ear effusion is present.  Left Ear: A middle ear effusion is present.  Nose: Mucosal edema present.  Mouth/Throat: Uvula is midline, oropharynx is clear and moist and mucous membranes are normal.  Nasal erythema  Eyes: Conjunctivae and EOM are normal.  Neck: Neck supple. No thyromegaly present.  Cardiovascular: Normal rate, regular rhythm, S1 normal, S2 normal and normal heart sounds.   No murmur heard. Pulmonary/Chest: Effort normal and breath sounds normal. She has no wheezes. She has no rales.  Good air movement  Musculoskeletal: Normal range of motion.  Healing purple brown ecchymosis at 2nd through 4th toes and dorsum of foot extending from 1st -5th TMT joint with main aspect of soft tissue swelling and pain at 2nd and 3rd MT. Severe tenderness over proximal head of 5th MT. No  tenderness of [place] ligament and medial and lateral malleolus.   Lymphadenopathy:       Head (right side): Tonsillar adenopathy present.       Head (left side): No tonsillar adenopathy present.    She has no cervical adenopathy.  Neurological: She is alert and oriented to person, place, and time.  Skin: Skin is warm and dry.  Psychiatric: She has a normal mood and affect. Her behavior is normal.  Nursing note and vitals reviewed.   Filed Vitals:   09/24/15 1249  BP: 118/72  Pulse: 57  Temp: 98 F (36.7 C)  TempSrc: Oral  Resp: 18  Height: 5\' 5"  (1.651 m)  Weight: 237 lb 6.4 oz (107.684 kg)  SpO2: 95%   Dg Foot Complete Left  09/24/2015  CLINICAL DATA:  Injury 2 weeks ago with pain over the second and fifth metatarsals. EXAM: LEFT FOOT - COMPLETE 3+ VIEW COMPARISON:  None. FINDINGS: There is no evidence of fracture or dislocation. There is plantar calcaneal spur. Decreased joint space is identified in the distal interphalangeal joints of the toes. Soft tissues are unremarkable. IMPRESSION: No acute fracture or dislocation. Electronically Signed   By: Abelardo Diesel M.D.   On: 09/24/2015 14:36      Assessment & Plan:   Avoid nasal steroids due to hx of glaucoma. 1. Injury of left foot, initial encounter  - placed in air splint, RICE  2. Foot sprain, left, initial encounter   3. Sinobronchitis     Orders Placed This Encounter  Procedures  . DG Foot Complete Left    Standing Status: Future     Number of Occurrences: 1     Standing Expiration Date: 09/23/2016    Order Specific Question:  Reason for Exam (SYMPTOM  OR DIAGNOSIS REQUIRED)    Answer:  injury 2 wks ago - pain over 2nd and 5th proximal metatarsals.    Order Specific Question:  Preferred imaging location?    Answer:  External    Meds ordered this encounter  Medications  . azithromycin (ZITHROMAX) 250 MG tablet    Sig: Take 2 tabs PO x 1 dose, then 1 tab PO QD x 4 days    Dispense:  6 tablet    Refill:  0  . HYDROcodone-homatropine (HYCODAN) 5-1.5 MG/5ML syrup    Sig: Take 5 mLs by mouth every 8 (eight) hours as needed for cough.    Dispense:  120 mL    Refill:  0    I personally performed the services described in this documentation, which was scribed in my presence. The recorded information has been reviewed and considered, and addended by me as needed.  Delman Cheadle, MD MPH

## 2015-09-29 ENCOUNTER — Emergency Department (HOSPITAL_COMMUNITY): Payer: PPO

## 2015-09-29 ENCOUNTER — Inpatient Hospital Stay (HOSPITAL_COMMUNITY)
Admission: EM | Admit: 2015-09-29 | Discharge: 2015-10-04 | DRG: 392 | Disposition: A | Discharge status: Home / Self Care | Payer: PPO | Attending: Internal Medicine | Admitting: Internal Medicine

## 2015-09-29 ENCOUNTER — Encounter (HOSPITAL_COMMUNITY): Payer: Self-pay

## 2015-09-29 DIAGNOSIS — Z880 Allergy status to penicillin: Secondary | ICD-10-CM | POA: Diagnosis not present

## 2015-09-29 DIAGNOSIS — E559 Vitamin D deficiency, unspecified: Secondary | ICD-10-CM | POA: Diagnosis not present

## 2015-09-29 DIAGNOSIS — I10 Essential (primary) hypertension: Secondary | ICD-10-CM | POA: Diagnosis present

## 2015-09-29 DIAGNOSIS — K921 Melena: Secondary | ICD-10-CM | POA: Diagnosis not present

## 2015-09-29 DIAGNOSIS — Z87891 Personal history of nicotine dependence: Secondary | ICD-10-CM | POA: Diagnosis not present

## 2015-09-29 DIAGNOSIS — R109 Unspecified abdominal pain: Secondary | ICD-10-CM

## 2015-09-29 DIAGNOSIS — F329 Major depressive disorder, single episode, unspecified: Secondary | ICD-10-CM | POA: Diagnosis present

## 2015-09-29 DIAGNOSIS — A084 Viral intestinal infection, unspecified: Secondary | ICD-10-CM | POA: Diagnosis not present

## 2015-09-29 DIAGNOSIS — E878 Other disorders of electrolyte and fluid balance, not elsewhere classified: Secondary | ICD-10-CM | POA: Diagnosis not present

## 2015-09-29 DIAGNOSIS — E860 Dehydration: Secondary | ICD-10-CM | POA: Diagnosis present

## 2015-09-29 DIAGNOSIS — Z79899 Other long term (current) drug therapy: Secondary | ICD-10-CM | POA: Diagnosis not present

## 2015-09-29 DIAGNOSIS — K6389 Other specified diseases of intestine: Secondary | ICD-10-CM | POA: Diagnosis not present

## 2015-09-29 DIAGNOSIS — Z8249 Family history of ischemic heart disease and other diseases of the circulatory system: Secondary | ICD-10-CM | POA: Diagnosis not present

## 2015-09-29 DIAGNOSIS — K529 Noninfective gastroenteritis and colitis, unspecified: Secondary | ICD-10-CM | POA: Diagnosis present

## 2015-09-29 DIAGNOSIS — Z809 Family history of malignant neoplasm, unspecified: Secondary | ICD-10-CM | POA: Diagnosis not present

## 2015-09-29 DIAGNOSIS — K5790 Diverticulosis of intestine, part unspecified, without perforation or abscess without bleeding: Secondary | ICD-10-CM | POA: Diagnosis present

## 2015-09-29 DIAGNOSIS — Z7982 Long term (current) use of aspirin: Secondary | ICD-10-CM

## 2015-09-29 DIAGNOSIS — E876 Hypokalemia: Secondary | ICD-10-CM | POA: Diagnosis not present

## 2015-09-29 DIAGNOSIS — E785 Hyperlipidemia, unspecified: Secondary | ICD-10-CM | POA: Diagnosis not present

## 2015-09-29 DIAGNOSIS — E871 Hypo-osmolality and hyponatremia: Secondary | ICD-10-CM

## 2015-09-29 DIAGNOSIS — Z86711 Personal history of pulmonary embolism: Secondary | ICD-10-CM

## 2015-09-29 DIAGNOSIS — H409 Unspecified glaucoma: Secondary | ICD-10-CM | POA: Diagnosis present

## 2015-09-29 DIAGNOSIS — I421 Obstructive hypertrophic cardiomyopathy: Secondary | ICD-10-CM | POA: Diagnosis not present

## 2015-09-29 LAB — GASTROINTESTINAL PANEL BY PCR, STOOL (REPLACES STOOL CULTURE)
ADENOVIRUS F40/41: NOT DETECTED
ASTROVIRUS: NOT DETECTED
CYCLOSPORA CAYETANENSIS: NOT DETECTED
Campylobacter species: NOT DETECTED
Cryptosporidium: NOT DETECTED
E. coli O157: NOT DETECTED
ENTAMOEBA HISTOLYTICA: NOT DETECTED
ENTEROAGGREGATIVE E COLI (EAEC): NOT DETECTED
ENTEROPATHOGENIC E COLI (EPEC): NOT DETECTED
ENTEROTOXIGENIC E COLI (ETEC): NOT DETECTED
Giardia lamblia: NOT DETECTED
NOROVIRUS GI/GII: NOT DETECTED
Plesimonas shigelloides: NOT DETECTED
Rotavirus A: NOT DETECTED
SHIGA LIKE TOXIN PRODUCING E COLI (STEC): NOT DETECTED
Salmonella species: NOT DETECTED
Sapovirus (I, II, IV, and V): NOT DETECTED
Shigella/Enteroinvasive E coli (EIEC): NOT DETECTED
VIBRIO CHOLERAE: NOT DETECTED
VIBRIO SPECIES: NOT DETECTED
Yersinia enterocolitica: NOT DETECTED

## 2015-09-29 LAB — COMPREHENSIVE METABOLIC PANEL
ALT: 30 U/L (ref 14–54)
AST: 33 U/L (ref 15–41)
Albumin: 4.4 g/dL (ref 3.5–5.0)
Alkaline Phosphatase: 73 U/L (ref 38–126)
Anion gap: 12 (ref 5–15)
BUN: 18 mg/dL (ref 6–20)
CO2: 26 mmol/L (ref 22–32)
Calcium: 10 mg/dL (ref 8.9–10.3)
Chloride: 94 mmol/L — ABNORMAL LOW (ref 101–111)
Creatinine, Ser: 0.85 mg/dL (ref 0.44–1.00)
GFR calc Af Amer: >60 mL/min (ref >60)
GFR calc non Af Amer: >60 mL/min (ref >60)
Glucose, Bld: 148 mg/dL — ABNORMAL HIGH (ref 65–99)
Potassium: 3.6 mmol/L (ref 3.5–5.1)
Sodium: 132 mmol/L — ABNORMAL LOW (ref 135–145)
Total Bilirubin: 0.6 mg/dL (ref 0.3–1.2)
Total Protein: 7.7 g/dL (ref 6.5–8.1)

## 2015-09-29 LAB — CBC
HEMATOCRIT: 40.5 % (ref 36.0–46.0)
Hemoglobin: 14.8 g/dL (ref 12.0–15.0)
MCH: 34.4 pg — AB (ref 26.0–34.0)
MCHC: 36.5 g/dL — ABNORMAL HIGH (ref 30.0–36.0)
MCV: 94.2 fL (ref 78.0–100.0)
PLATELETS: 317 10*3/uL (ref 150–400)
RBC: 4.3 MIL/uL (ref 3.87–5.11)
RDW: 12.9 % (ref 11.5–15.5)
WBC: 17.2 10*3/uL — AB (ref 4.0–10.5)

## 2015-09-29 LAB — C DIFFICILE QUICK SCREEN W PCR REFLEX
C Diff antigen: NEGATIVE
C Diff interpretation: NEGATIVE
C Diff toxin: NEGATIVE

## 2015-09-29 LAB — ABO/RH: ABO/RH(D): A POS

## 2015-09-29 LAB — TYPE AND SCREEN
ABO/RH(D): A POS
Antibody Screen: NEGATIVE

## 2015-09-29 MED ORDER — TIMOLOL MALEATE 0.5 % OP SOLN
1.0000 [drp] | Freq: Two times a day (BID) | OPHTHALMIC | Status: DC
  Administered 2015-09-29 – 2015-10-03 (×9): 1 [drp] via OPHTHALMIC
  Filled 2015-09-29: qty 5

## 2015-09-29 MED ORDER — ASPIRIN EC 81 MG PO TBEC
81.0000 mg | DELAYED_RELEASE_TABLET | Freq: Every day | ORAL | Status: DC
  Administered 2015-09-29 – 2015-10-03 (×4): 81 mg via ORAL
  Filled 2015-09-29 (×6): qty 1

## 2015-09-29 MED ORDER — ONDANSETRON HCL 4 MG PO TABS: 4.0000 mg | ORAL_TABLET | Freq: Four times a day (QID) | ORAL | Status: DC | PRN

## 2015-09-29 MED ORDER — ACETAMINOPHEN 325 MG PO TABS
650.0000 mg | ORAL_TABLET | Freq: Four times a day (QID) | ORAL | Status: DC | PRN
  Administered 2015-09-30 – 2015-10-02 (×7): 650 mg via ORAL
  Filled 2015-09-29 (×8): qty 2

## 2015-09-29 MED ORDER — AMLODIPINE BESYLATE 10 MG PO TABS
10.0000 mg | ORAL_TABLET | Freq: Every day | ORAL | Status: DC
  Administered 2015-09-30 – 2015-10-03 (×4): 10 mg via ORAL
  Filled 2015-09-29 (×5): qty 1

## 2015-09-29 MED ORDER — LATANOPROST 0.005 % OP SOLN
1.0000 [drp] | Freq: Every day | OPHTHALMIC | Status: DC
  Administered 2015-09-29 – 2015-10-03 (×5): 1 [drp] via OPHTHALMIC
  Filled 2015-09-29: qty 2.5

## 2015-09-29 MED ORDER — DIATRIZOATE MEGLUMINE & SODIUM 66-10 % PO SOLN: 15.0000 mL | Freq: Once | ORAL | Status: DC

## 2015-09-29 MED ORDER — ONDANSETRON HCL 4 MG/2ML IJ SOLN
4.0000 mg | Freq: Four times a day (QID) | INTRAMUSCULAR | Status: DC | PRN
  Administered 2015-09-29: 4 mg via INTRAVENOUS
  Filled 2015-09-29: qty 2

## 2015-09-29 MED ORDER — LORATADINE 10 MG PO TABS
10.0000 mg | ORAL_TABLET | Freq: Every day | ORAL | Status: DC
  Administered 2015-09-29 – 2015-10-03 (×5): 10 mg via ORAL
  Filled 2015-09-29 (×6): qty 1

## 2015-09-29 MED ORDER — MELATONIN 1 MG PO TABS: 1.0000 | ORAL_TABLET | Freq: Every day | ORAL | Status: DC

## 2015-09-29 MED ORDER — POTASSIUM CHLORIDE IN NACL 20-0.9 MEQ/L-% IV SOLN
INTRAVENOUS | Status: DC
  Administered 2015-09-29: 75 mL/h via INTRAVENOUS
  Administered 2015-09-30: 06:00:00 via INTRAVENOUS
  Filled 2015-09-29 (×2): qty 1000

## 2015-09-29 MED ORDER — ACETAMINOPHEN 650 MG RE SUPP: 650.0000 mg | Freq: Four times a day (QID) | RECTAL | Status: DC | PRN

## 2015-09-29 MED ORDER — VITAMIN B-6 50 MG PO TABS: 50.0000 mg | ORAL_TABLET | Freq: Every day | ORAL | Status: DC

## 2015-09-29 MED ORDER — METRONIDAZOLE IN NACL 5-0.79 MG/ML-% IV SOLN
500.0000 mg | Freq: Three times a day (TID) | INTRAVENOUS | Status: DC
  Administered 2015-09-29 – 2015-10-01 (×5): 500 mg via INTRAVENOUS
  Filled 2015-09-29 (×6): qty 100

## 2015-09-29 MED ORDER — HYDROCODONE-HOMATROPINE 5-1.5 MG/5ML PO SYRP: 5.0000 mL | ORAL_SOLUTION | Freq: Three times a day (TID) | ORAL | Status: DC | PRN

## 2015-09-29 MED ORDER — VENLAFAXINE HCL ER 75 MG PO CP24
75.0000 mg | ORAL_CAPSULE | Freq: Every day | ORAL | Status: DC
Start: 2015-09-29 — End: 2015-10-04
  Administered 2015-09-29 – 2015-10-03 (×5): 75 mg via ORAL
  Filled 2015-09-29 (×8): qty 1

## 2015-09-29 MED ORDER — SODIUM CHLORIDE 0.9 % IV BOLUS (SEPSIS)
1000.0000 mL | Freq: Once | INTRAVENOUS | Status: AC
  Administered 2015-09-29: 1000 mL via INTRAVENOUS

## 2015-09-29 MED ORDER — VITAMIN B-6 50 MG PO TABS
50.0000 mg | ORAL_TABLET | Freq: Every day | ORAL | Status: DC
  Administered 2015-09-29 – 2015-10-03 (×5): 50 mg via ORAL
  Filled 2015-09-29 (×6): qty 1

## 2015-09-29 MED ORDER — IOPAMIDOL (ISOVUE-300) INJECTION 61%
100.0000 mL | Freq: Once | INTRAVENOUS | Status: AC | PRN
  Administered 2015-09-29: 100 mL via INTRAVENOUS

## 2015-09-29 MED ORDER — CIPROFLOXACIN IN D5W 400 MG/200ML IV SOLN
400.0000 mg | Freq: Two times a day (BID) | INTRAVENOUS | Status: DC
  Administered 2015-09-30 – 2015-10-01 (×3): 400 mg via INTRAVENOUS
  Filled 2015-09-29 (×4): qty 200

## 2015-09-29 MED ORDER — METRONIDAZOLE IN NACL 5-0.79 MG/ML-% IV SOLN
500.0000 mg | Freq: Once | INTRAVENOUS | Status: AC
  Administered 2015-09-29: 500 mg via INTRAVENOUS
  Filled 2015-09-29: qty 100

## 2015-09-29 MED ORDER — CIPROFLOXACIN IN D5W 400 MG/200ML IV SOLN
400.0000 mg | Freq: Once | INTRAVENOUS | Status: AC
  Administered 2015-09-29: 400 mg via INTRAVENOUS
  Filled 2015-09-29: qty 200

## 2015-09-29 MED ORDER — ASPIRIN 81 MG PO TABS: 81.0000 mg | ORAL_TABLET | Freq: Every day | ORAL | Status: DC

## 2015-09-29 NOTE — ED Notes (Signed)
Pt passed a moderate amount of burgundy colored blood with a few small clots from rectum.  Unable to measure.

## 2015-09-29 NOTE — Progress Notes (Signed)
PHARMACIST - PHYSICIAN ORDER COMMUNICATION  CONCERNING: P&T Medication Policy on Herbal Medications  DESCRIPTION:  This patient's order for: Melatonin   has been noted.  This product(s) is classified as an "herbal" or natural product. Due to a lack of definitive safety studies or FDA approval, nonstandard manufacturing practices, plus the potential risk of unknown drug-drug interactions while on inpatient medications, the Pharmacy and Therapeutics Committee does not permit the use of "herbal" or natural products of this type within [facility].   ACTION TAKEN: The pharmacy department is unable to verify this order at this time and your patient has been informed of this safety policy. Please reevaluate patient's clinical condition at discharge and address if the herbal or natural product(s) should be resumed at that time.  Dia Sitter, PharmD, BCPS 09/29/2015 5:22 PM

## 2015-09-29 NOTE — H&P (Signed)
[facility] History and Physical     History and Physical:    Susan Jenkins   [id number] DOB: [birth year] DOA: 09/29/2015  PCP: Nyoka Cowden, MD   Chief Complaint: Hematochezia & Abd pain  History of Present Illness:   Susan Jenkins is an 70 y.o. female past medical history of hypertension, hemorrhoids and hypertrophic cardiomyopathy that comes into the hospital for abdominal pain chills and hematochezia. She relates that about 11 PM prior to admission she started developing abdominal pain and fever went to the bathroom and started having bloody bowel stools mucousy in nature. Since then she's had 8 additional bloody bowel movements with severe pain. She was recently treated for bronchitis episode with a course of azithromycin which she finished yesterday. She relates her sister had C. difficile colitis which was treated 2 weeks ago.  In the ED: She's found to be hyponatremic, with leukocytosis CT scan of the abdomen and pelvis was done that shows acute colitis of the transfer and distal colon so we're consulted for further evaluation.  ROS:   ROS  Constitutional: No fever, no chills;  Appetite normal; No weight loss, no weight gain, no fatigue.   HEENT: No blurry vision, no diplopia, no pharyngitis, no dysphagia  CV: No chest pain, no palpitations, no PND, no orthopnea, no edema.   Resp: No SOB, no cough, no pleuritic pain.  GI: No nausea, no vomiting, no diarrhea, no melena, no hematochezia, no constipation, no abdominal pain.   GU: No dysuria, no hematuria, no frequency, no urgency.  MSK: No myalgias, no arthralgias.   Neuro:  No headache, no focal neurological deficits, no history of seizures.   Psych: No depression, no anxiety.   Endo: No heat intolerance, no cold intolerance, no polyuria, no polydipsia   Skin: No rashes, no skin lesions.   Heme: No easy bruising.   Travel history: No recent travel.   Past Medical History:   Past Medical History    Diagnosis Date  . Hyperlipidemia   . Hypertension   . Vitamin D deficiency   . Hemorrhoids   . Depression   . Pulmonary embolus ([place]) 1971    following surgery  . HOCM (hypertrophic obstructive cardiomyopathy) ([place])   . Glaucoma     Past Surgical History:   Past Surgical History  Procedure Laterality Date  . Ankle surgery    . Knee surgery      Social History:   Social History   Social History  . Marital Status: Single    Spouse Name: N/A  . Number of Children: N/A  . Years of Education: N/A   Occupational History  . Not on file.   Social History Main Topics  . Smoking status: Former Smoker    Types: Cigarettes    Quit date: 05/03/1980  . Smokeless tobacco: Not on file  . Alcohol Use: No  . Drug Use: No  . Sexual Activity: No   Other Topics Concern  . Not on file   Social History Narrative   Ambulatory status: Ad lib. Family history:   Family History  Problem Relation Age of Onset  . Dementia Mother   . Cancer Father   . Heart failure Father     Allergies   Amoxicillin-pot clavulanate  Current Medications:   Prior to Admission medications   Medication Sig Start Date End Date Taking? Authorizing Provider  amLODipine (NORVASC) 10 MG tablet TAKE 1 TABLET BY MOUTH EVERY DAY 02/21/15  Yes Marletta Lor, MD  aspirin 81 MG tablet Take 81 mg by mouth daily.   Yes Historical Provider, MD  Bismuth Subsalicylate (KAOPECTATE PO) Take 10 mLs by mouth every 6 (six) hours as needed (for nausea).   Yes Historical Provider, MD  Calcium Carbonate-Vit D-Min 600-400 MG-UNIT TABS Take 1 tablet by mouth daily.   Yes Historical Provider, MD  docusate sodium (COLACE) 100 MG capsule Take 100 mg by mouth 2 (two) times daily as needed for constipation.   Yes Historical Provider, MD  Garlic (GARLIQUE) [id number] MG TBEC Take 1 tablet by mouth at bedtime.   Yes Historical Provider, MD  hydrochlorothiazide (HYDRODIURIL) 25 MG tablet Take 1 tablet (25 mg total) by mouth  daily. 04/01/15  Yes Marletta Lor, MD  HYDROcodone-homatropine [facility]) 5-1.5 MG/5ML syrup Take 5 mLs by mouth every 8 (eight) hours as needed for cough. 09/24/15  Yes Shawnee Knapp, MD  latanoprost (XALATAN) 0.005 % ophthalmic solution Place 1 drop into both eyes at bedtime.   Yes Historical Provider, MD  loratadine (CLARITIN) 10 MG tablet Take 10 mg by mouth daily.   Yes Historical Provider, MD  Melatonin 1 MG TABS Take 1 tablet by mouth at bedtime.   Yes Historical Provider, MD  Misc Natural Products (OSTEO BI-FLEX TRIPLE STRENGTH PO) Take 1 tablet by mouth 2 (two) times daily.   Yes Historical Provider, MD  Multiple Vitamin (MULTIVITAMIN) capsule Take 1 capsule by mouth daily.   Yes Historical Provider, MD  Omega-3 Fatty Acids (FISH OIL PO) Take 1 capsule by mouth daily.   Yes Historical Provider, MD  pyridOXINE (B-6) 50 MG tablet Take 50 mg by mouth daily.   Yes Historical Provider, MD  timolol (TIMOPTIC) 0.5 % ophthalmic solution Place 1 drop into both eyes 2 (two) times daily.    Yes Historical Provider, MD  venlafaxine XR (EFFEXOR-XR) 75 MG 24 hr capsule TAKE ONE CAPSULE BY MOUTH EVERY DAY 02/21/15  Yes Marletta Lor, MD  azithromycin (ZITHROMAX) 250 MG tablet Take 2 tabs PO x 1 dose, then 1 tab PO QD x 4 days Patient not taking: Reported on 09/29/2015 09/24/15   Shawnee Knapp, MD  rosuvastatin (CRESTOR) 10 MG tablet TAKE 1 TABLET BY MOUTH EVERY DAY Patient not taking: Reported on 09/24/2015 05/09/15   Marletta Lor, MD    Physical Exam:   Filed Vitals:   09/29/15 1213 09/29/15 1347 09/29/15 1525  BP: 165/83 147/121 168/68  Pulse: 58 70 67  Temp: 98.4 F (36.9 C)    TempSrc: Oral    Resp: 16 16 16   Height: 5\' 7"  (1.702 m)    Weight: 107.502 kg (237 lb)    SpO2: 98% 98% 97%     Physical Exam: Blood pressure 168/68, pulse 67, temperature 98.4 F (36.9 C), temperature source Oral, resp. rate 16, height 5\' 7"  (1.702 m), weight 107.502 kg (237 lb), SpO2 97 %. Gen: No  acute distress. Head: Normocephalic, atraumatic. Eyes: PERRL, EOMI, sclerae nonicteric. Mouth: Oropharynx Neck: Supple, no thyromegaly, no lymphadenopathy, no jugular venous distention. Chest: She is good air movement and clear to auscultation CV: Distant heart sounds with regular rate and rhythm Abdomen: Soft nondistended with mild tenderness in the left lower quadrant and left upper quadrant no rebound or guarding Extremities: Extremities Skin: Warm and dry. Neuro: Alert and oriented times3; cranial nerves II through XII grossly intact. Psych: Mood and affect normal.   Data Review:    Labs: Basic Metabolic Panel:  Recent Labs Lab 09/29/15 1234  NA 132*  K 3.6  CL 94*  CO2 26  GLUCOSE 148*  BUN 18  CREATININE 0.85  CALCIUM 10.0   Liver Function Tests:  Recent Labs Lab 09/29/15 1234  AST 33  ALT 30  ALKPHOS 73  BILITOT 0.6  PROT 7.7  ALBUMIN 4.4   No results for input(s): LIPASE, AMYLASE in the last 168 hours. No results for input(s): AMMONIA in the last 168 hours. CBC:  Recent Labs Lab 09/29/15 1234  WBC 17.2*  HGB 14.8  HCT 40.5  MCV 94.2  PLT 317   Cardiac Enzymes: No results for input(s): CKTOTAL, CKMB, CKMBINDEX, TROPONINI in the last 168 hours.  BNP (last 3 results) No results for input(s): PROBNP in the last 8760 hours. CBG: No results for input(s): GLUCAP in the last 168 hours.  Radiographic Studies: Ct Abdomen Pelvis W Contrast  09/29/2015  CLINICAL DATA:  70 year old female with history of bright red rectal bleeding and mucus in the stools since yesterday. History of hemorrhoids. Intermittent abdominal pain. EXAM: CT ABDOMEN AND PELVIS WITH CONTRAST TECHNIQUE: Multidetector CT imaging of the abdomen and pelvis was performed using the standard protocol following bolus administration of intravenous contrast. CONTRAST:  176mL ISOVUE-300 IOPAMIDOL (ISOVUE-300) INJECTION 61% COMPARISON:  No priors. FINDINGS: Lower chest: Trace amount of  pericardial fluid and/or thickening, unlikely to be of any hemodynamic significance at this time. No associated pericardial calcification. Hepatobiliary: 3.4 x 4.4 cm well-defined low-attenuation lesion in the left lobe of the liver between segments 2 and 3 is compatible with a simple cyst. Adjacent to this within segment 2 there is a 7 mm low attenuation lesion which is too small to characterize, but statistically likely to represent a tiny cyst. No other suspicious hepatic lesions. No intra or extrahepatic biliary ductal dilatation. Gallbladder is normal in appearance. Pancreas: No pancreatic mass. No pancreatic ductal dilatation. No pancreatic or peripancreatic fluid or inflammatory changes. Spleen: Unremarkable. Adrenals/Urinary Tract: Bilateral adrenal glands and bilateral kidneys are normal in appearance. No hydroureteronephrosis. Urinary bladder is normal in appearance. Stomach/Bowel: Normal appearance of the stomach. There is no pathologic dilatation of small bowel or colon. Numerous colonic diverticulae are noted, most evident in the sigmoid colon. No definite surrounding inflammatory changes to suggest an acute diverticulitis at this time. However, there is extensive colonic wall thickening, most evident in the distal transverse colon, splenic flexure and descending colon, where there is also some very subtle surrounding inflammatory changes, compatible with an acute colitis. Normal appendix. Vascular/Lymphatic: Atherosclerosis throughout the abdominal and pelvic vasculature, without evidence of aneurysm or dissection. No lymphadenopathy noted in the abdomen or pelvis. Reproductive: Uterus and ovaries are unremarkable in appearance. Other: No significant volume of ascites.  No pneumoperitoneum. Musculoskeletal: There are no aggressive appearing lytic or blastic lesions noted in the visualized portions of the skeleton. IMPRESSION: 1. Colonic wall thickening and surrounding inflammatory changes extending  from the distal transverse colon into the distal descending colon, compatible with an acute colitis. 2. There is also evidence of colonic diverticulosis, most apparent in the sigmoid colon, without definitive findings to suggest an acute diverticulitis at this time. 3. Normal appendix. 4. Mild atherosclerosis. 5. Additional incidental findings, as above. Electronically Signed   By: Vinnie Langton M.D.   On: 09/29/2015 15:04   *I have personally reviewed the images above*  EKG: Independently reviewed. None   Assessment/Plan:   Acute colitis/Hematochezia: She was recently treated with a course of empiric antibiotics, CT scan of the abdomen pelvis does not show  the diverticulitis I agree with treating her with empiric antibiotics ciprofloxacin and Flagyl, also check a C. Difficile, stool cultures. We'll place SCDs avoid anticoagulation. Admit to inpatient start on IV fluids monitor hemoglobin as this may drop after hydration. Hold diuretic therapy She'll initially like to try regular diet.  Essential hypertension Hold antihypertensive medication except amlodipine.  Hyponatremia: Infrarenal hold diuretics start on IV fluids recheck in the morning.  DVT prophylaxis  Code Status: Full. Family Communication: sister Disposition Plan: Home when stable.  Time spent: 60 min  Charlynne Cousins [facility] Pager [phone]  If 7PM-7AM, please contact night-coverage [email or web address] Password [email or web address] 09/29/2015, 4:09 PM

## 2015-09-29 NOTE — ED Provider Notes (Signed)
CSN: [id number]     Arrival date & time 09/29/15  1204 History   First MD Initiated Contact with Patient 09/29/15 1318     Chief Complaint  Patient presents with  . Rectal Bleeding     Patient is a 70 y.o. female presenting with hematochezia. The history is provided by the patient. No language interpreter was used.  Rectal Bleeding  CHARIDY HELMUS is a 70 y.o. female who presents to the Emergency Department complaining of hematochezia.  Last night she developed bright red blood per rectum. She has associated diffuse lower abdominal cramping in the sensation that she has to pass gas. Every time she has had sensation that she has a small bloody bowel movement. She has associated nausea, chills, diaphoresis. No fevers, vomiting, dysuria. No history of prior similar symptoms. He has diverticulosis. She takes baby aspirin daily.  Past Medical History  Diagnosis Date  . Hyperlipidemia   . Hypertension   . Vitamin D deficiency   . Hemorrhoids   . Depression   . Pulmonary embolus ([place]) 1971    following surgery  . HOCM (hypertrophic obstructive cardiomyopathy) ([place])   . Glaucoma    Past Surgical History  Procedure Laterality Date  . Ankle surgery    . Knee surgery     Family History  Problem Relation Age of Onset  . Dementia Mother   . Cancer Father   . Heart failure Father    Social History  Substance Use Topics  . Smoking status: Former Smoker    Types: Cigarettes    Quit date: 05/03/1980  . Smokeless tobacco: None  . Alcohol Use: No   OB History    No data available     Review of Systems  Gastrointestinal: Positive for hematochezia.  All other systems reviewed and are negative.     Allergies  Amoxicillin-pot clavulanate  Home Medications   Prior to Admission medications   Medication Sig Start Date End Date Taking? Authorizing Provider  amLODipine (NORVASC) 10 MG tablet TAKE 1 TABLET BY MOUTH EVERY DAY 02/21/15  Yes Marletta Lor, MD  aspirin 81 MG  tablet Take 81 mg by mouth daily.   Yes Historical Provider, MD  Bismuth Subsalicylate (KAOPECTATE PO) Take 10 mLs by mouth every 6 (six) hours as needed (for nausea).   Yes Historical Provider, MD  Calcium Carbonate-Vit D-Min 600-400 MG-UNIT TABS Take 1 tablet by mouth daily.   Yes Historical Provider, MD  docusate sodium (COLACE) 100 MG capsule Take 100 mg by mouth 2 (two) times daily as needed for constipation.   Yes Historical Provider, MD  Garlic (GARLIQUE) A999333 MG TBEC Take 1 tablet by mouth at bedtime.   Yes Historical Provider, MD  hydrochlorothiazide (HYDRODIURIL) 25 MG tablet Take 1 tablet (25 mg total) by mouth daily. 04/01/15  Yes Marletta Lor, MD  HYDROcodone-homatropine [facility]) 5-1.5 MG/5ML syrup Take 5 mLs by mouth every 8 (eight) hours as needed for cough. 09/24/15  Yes Shawnee Knapp, MD  latanoprost (XALATAN) 0.005 % ophthalmic solution Place 1 drop into both eyes at bedtime.   Yes Historical Provider, MD  loratadine (CLARITIN) 10 MG tablet Take 10 mg by mouth daily.   Yes Historical Provider, MD  Melatonin 1 MG TABS Take 1 tablet by mouth at bedtime.   Yes Historical Provider, MD  Misc Natural Products (OSTEO BI-FLEX TRIPLE STRENGTH PO) Take 1 tablet by mouth 2 (two) times daily.   Yes Historical Provider, MD  Multiple Vitamin (MULTIVITAMIN) capsule  Take 1 capsule by mouth daily.   Yes Historical Provider, MD  Omega-3 Fatty Acids (FISH OIL PO) Take 1 capsule by mouth daily.   Yes Historical Provider, MD  pyridOXINE (B-6) 50 MG tablet Take 50 mg by mouth daily.   Yes Historical Provider, MD  timolol (TIMOPTIC) 0.5 % ophthalmic solution Place 1 drop into both eyes 2 (two) times daily.    Yes Historical Provider, MD  venlafaxine XR (EFFEXOR-XR) 75 MG 24 hr capsule TAKE ONE CAPSULE BY MOUTH EVERY DAY 02/21/15  Yes Marletta Lor, MD  azithromycin (ZITHROMAX) 250 MG tablet Take 2 tabs PO x 1 dose, then 1 tab PO QD x 4 days Patient not taking: Reported on 09/29/2015 09/24/15   Shawnee Knapp, MD  rosuvastatin (CRESTOR) 10 MG tablet TAKE 1 TABLET BY MOUTH EVERY DAY Patient not taking: Reported on 09/24/2015 05/09/15   Marletta Lor, MD   BP 169/59 mmHg  Pulse 68  Temp(Src) 98.9 F (37.2 C) (Oral)  Resp 15  Ht 5\' 7"  (1.702 m)  Wt 237 lb (107.502 kg)  BMI 37.11 kg/m2  SpO2 100% Physical Exam  Constitutional: She is oriented to person, place, and time. She appears well-developed and well-nourished.  HENT:  Head: Normocephalic and atraumatic.  Cardiovascular: Normal rate and regular rhythm.   No murmur heard. Pulmonary/Chest: Effort normal and breath sounds normal. No respiratory distress.  Abdominal: Soft. There is no rebound and no guarding.  Mild to moderate left lower quadrant tenderness without guarding or rebound  Genitourinary:  Nontender rectal exam was gross blood present in the rectal vault. No external hemorrhoids.  Musculoskeletal: She exhibits no edema or tenderness.  Neurological: She is alert and oriented to person, place, and time.  Skin: Skin is warm and dry.  Psychiatric: She has a normal mood and affect. Her behavior is normal.  Nursing note and vitals reviewed.   ED Course  Procedures (including critical care time) Labs Review Labs Reviewed  COMPREHENSIVE METABOLIC PANEL - Abnormal; Notable for the following:    Sodium 132 (*)    Chloride 94 (*)    Glucose, Bld 148 (*)    All other components within normal limits  CBC - Abnormal; Notable for the following:    WBC 17.2 (*)    MCH 34.4 (*)    MCHC 36.5 (*)    All other components within normal limits  C DIFFICILE QUICK SCREEN W PCR REFLEX  GASTROINTESTINAL PANEL BY PCR, STOOL (REPLACES STOOL CULTURE)  BASIC METABOLIC PANEL  CBC  TYPE AND SCREEN  ABO/RH    Imaging Review Ct Abdomen Pelvis W Contrast  09/29/2015  CLINICAL DATA:  70 year old female with history of bright red rectal bleeding and mucus in the stools since yesterday. History of hemorrhoids. Intermittent abdominal  pain. EXAM: CT ABDOMEN AND PELVIS WITH CONTRAST TECHNIQUE: Multidetector CT imaging of the abdomen and pelvis was performed using the standard protocol following bolus administration of intravenous contrast. CONTRAST:  132mL ISOVUE-300 IOPAMIDOL (ISOVUE-300) INJECTION 61% COMPARISON:  No priors. FINDINGS: Lower chest: Trace amount of pericardial fluid and/or thickening, unlikely to be of any hemodynamic significance at this time. No associated pericardial calcification. Hepatobiliary: 3.4 x 4.4 cm well-defined low-attenuation lesion in the left lobe of the liver between segments 2 and 3 is compatible with a simple cyst. Adjacent to this within segment 2 there is a 7 mm low attenuation lesion which is too small to characterize, but statistically likely to represent a tiny cyst. No other  suspicious hepatic lesions. No intra or extrahepatic biliary ductal dilatation. Gallbladder is normal in appearance. Pancreas: No pancreatic mass. No pancreatic ductal dilatation. No pancreatic or peripancreatic fluid or inflammatory changes. Spleen: Unremarkable. Adrenals/Urinary Tract: Bilateral adrenal glands and bilateral kidneys are normal in appearance. No hydroureteronephrosis. Urinary bladder is normal in appearance. Stomach/Bowel: Normal appearance of the stomach. There is no pathologic dilatation of small bowel or colon. Numerous colonic diverticulae are noted, most evident in the sigmoid colon. No definite surrounding inflammatory changes to suggest an acute diverticulitis at this time. However, there is extensive colonic wall thickening, most evident in the distal transverse colon, splenic flexure and descending colon, where there is also some very subtle surrounding inflammatory changes, compatible with an acute colitis. Normal appendix. Vascular/Lymphatic: Atherosclerosis throughout the abdominal and pelvic vasculature, without evidence of aneurysm or dissection. No lymphadenopathy noted in the abdomen or pelvis.  Reproductive: Uterus and ovaries are unremarkable in appearance. Other: No significant volume of ascites.  No pneumoperitoneum. Musculoskeletal: There are no aggressive appearing lytic or blastic lesions noted in the visualized portions of the skeleton. IMPRESSION: 1. Colonic wall thickening and surrounding inflammatory changes extending from the distal transverse colon into the distal descending colon, compatible with an acute colitis. 2. There is also evidence of colonic diverticulosis, most apparent in the sigmoid colon, without definitive findings to suggest an acute diverticulitis at this time. 3. Normal appendix. 4. Mild atherosclerosis. 5. Additional incidental findings, as above. Electronically Signed   By: Vinnie Langton M.D.   On: 09/29/2015 15:04   I have personally reviewed and evaluated these images and lab results as part of my medical decision-making.   EKG Interpretation None      MDM   Final diagnoses:  None    Pt here for evaluation of abdominal pain and hematochezia.  On initial evaluation concern for diverticulitis. She was treated with IV fluids and antibiotics. CBC with leukocytosis, normal hemoglobin. CT scan demonstrates acute colitis. Plan to admit for IV antibiotics given her onset of symptoms, leukocytosis. Hospitalist consulted for admission.  Pt updated of findings of studies and is in agreement with plan.    Quintella Reichert, MD 09/29/15 [phone]

## 2015-09-29 NOTE — ED Notes (Signed)
Patient reports that she began having bright red rectal bleeding and stringy mucous since yesterday. Patient reports a history of hemorrhoids. Patient also reports "lots of gas." and bilateral intermittent abdominal pain.

## 2015-09-30 LAB — BASIC METABOLIC PANEL
Anion gap: 9 (ref 5–15)
BUN: 8 mg/dL (ref 6–20)
CALCIUM: 8.9 mg/dL (ref 8.9–10.3)
CO2: 27 mmol/L (ref 22–32)
CREATININE: 0.69 mg/dL (ref 0.44–1.00)
Chloride: 97 mmol/L — ABNORMAL LOW (ref 101–111)
GFR calc Af Amer: >60 mL/min (ref >60)
GLUCOSE: 136 mg/dL — AB (ref 65–99)
POTASSIUM: 3 mmol/L — AB (ref 3.5–5.1)
SODIUM: 133 mmol/L — AB (ref 135–145)

## 2015-09-30 LAB — CBC
HCT: 37.3 % (ref 36.0–46.0)
HEMOGLOBIN: 13.4 g/dL (ref 12.0–15.0)
MCH: 34.1 pg — AB (ref 26.0–34.0)
MCHC: 35.9 g/dL (ref 30.0–36.0)
MCV: 94.9 fL (ref 78.0–100.0)
Platelets: 283 10*3/uL (ref 150–400)
RBC: 3.93 MIL/uL (ref 3.87–5.11)
RDW: 13.1 % (ref 11.5–15.5)
WBC: 18.5 10*3/uL — AB (ref 4.0–10.5)

## 2015-09-30 MED ORDER — SODIUM CHLORIDE 0.9 % IV SOLN: INTRAVENOUS | Status: DC

## 2015-09-30 MED ORDER — POTASSIUM CHLORIDE IN NACL 40-0.9 MEQ/L-% IV SOLN
INTRAVENOUS | Status: DC
  Administered 2015-09-30 – 2015-10-01 (×2): 75 mL/h via INTRAVENOUS
  Administered 2015-10-02 – 2015-10-04 (×5): 100 mL/h via INTRAVENOUS
  Filled 2015-09-30 (×11): qty 1000

## 2015-09-30 NOTE — Progress Notes (Signed)
[facility] PROGRESS NOTE    Progress Note  Susan Jenkins  [id number] DOB: [birth year] DOA: 09/29/2015 PCP: Nyoka Cowden, MD     Brief Narrative:   Susan Jenkins is an 70 y.o. female      Assessment/Plan:   Acute colitis: - c. Dif negative. Afebrile, ct abd and pelvis showed colitis. - likely infectious colitis, unlikely ischemic colitis, also in DDx inflamatory colitis. - She cont to have bloody BM, will cont empiric antibiotics if no improvement, will have to call GI will give an additional  24 hrs. - tolerating her diet which she was not tolerating yesterday.  Essential hypertension: Cont norvasc, hold ACE-I  Hyponatremia: Improving with hydration.   DVT prophylaxis: SCD Family Communication:none Disposition Plan/Barrier to D/C: home 3 days Code Status:     Code Status Orders        Start     Ordered   09/29/15 1719  Full code   Continuous     09/29/15 1718    Code Status History    Date Active Date Inactive Code Status Order ID Comments User Context   This patient has a current code status but no historical code status.    Advance Directive Documentation        Most Recent Value   Type of Advance Directive  Healthcare Power of Attorney, Living will   Pre-existing out of facility DNR order (yellow form or pink MOST form)     "MOST" Form in Place?          IV Access:    Peripheral IV   Procedures and diagnostic studies:   Ct Abdomen Pelvis W Contrast  09/29/2015  CLINICAL DATA:  70 year old female with history of bright red rectal bleeding and mucus in the stools since yesterday. History of hemorrhoids. Intermittent abdominal pain. EXAM: CT ABDOMEN AND PELVIS WITH CONTRAST TECHNIQUE: Multidetector CT imaging of the abdomen and pelvis was performed using the standard protocol following bolus administration of intravenous contrast. CONTRAST:  113mL ISOVUE-300 IOPAMIDOL (ISOVUE-300) INJECTION 61% COMPARISON:  No priors. FINDINGS:  Lower chest: Trace amount of pericardial fluid and/or thickening, unlikely to be of any hemodynamic significance at this time. No associated pericardial calcification. Hepatobiliary: 3.4 x 4.4 cm well-defined low-attenuation lesion in the left lobe of the liver between segments 2 and 3 is compatible with a simple cyst. Adjacent to this within segment 2 there is a 7 mm low attenuation lesion which is too small to characterize, but statistically likely to represent a tiny cyst. No other suspicious hepatic lesions. No intra or extrahepatic biliary ductal dilatation. Gallbladder is normal in appearance. Pancreas: No pancreatic mass. No pancreatic ductal dilatation. No pancreatic or peripancreatic fluid or inflammatory changes. Spleen: Unremarkable. Adrenals/Urinary Tract: Bilateral adrenal glands and bilateral kidneys are normal in appearance. No hydroureteronephrosis. Urinary bladder is normal in appearance. Stomach/Bowel: Normal appearance of the stomach. There is no pathologic dilatation of small bowel or colon. Numerous colonic diverticulae are noted, most evident in the sigmoid colon. No definite surrounding inflammatory changes to suggest an acute diverticulitis at this time. However, there is extensive colonic wall thickening, most evident in the distal transverse colon, splenic flexure and descending colon, where there is also some very subtle surrounding inflammatory changes, compatible with an acute colitis. Normal appendix. Vascular/Lymphatic: Atherosclerosis throughout the abdominal and pelvic vasculature, without evidence of aneurysm or dissection. No lymphadenopathy noted in the abdomen or pelvis. Reproductive: Uterus and ovaries are unremarkable in appearance. Other: No significant volume of ascites.  No pneumoperitoneum. Musculoskeletal: There are no aggressive appearing lytic or blastic lesions noted in the visualized portions of the skeleton. IMPRESSION: 1. Colonic wall thickening and surrounding  inflammatory changes extending from the distal transverse colon into the distal descending colon, compatible with an acute colitis. 2. There is also evidence of colonic diverticulosis, most apparent in the sigmoid colon, without definitive findings to suggest an acute diverticulitis at this time. 3. Normal appendix. 4. Mild atherosclerosis. 5. Additional incidental findings, as above. Electronically Signed   By: Vinnie Langton M.D.   On: 09/29/2015 15:04     Medical Consultants:    None.  Anti-Infectives:   Cipro and Flagyl  Subjective:    Susan Jenkins pain improved tolerating diet.  Objective:    Filed Vitals:   09/29/15 1715 09/29/15 2132 09/30/15 0500 09/30/15 0521  BP: 169/59 157/44  168/60  Pulse: 68 72  77  Temp: 98.9 F (37.2 C) 98.9 F (37.2 C)  98.6 F (37 C)  TempSrc: Oral Oral  Oral  Resp: 15 16  16   Height: 5\' 7"  (1.702 m)     Weight: 107.502 kg (237 lb)  107.68 kg (237 lb 6.3 oz)   SpO2: 100% 92%  98%    Intake/Output Summary (Last 24 hours) at 09/30/15 1057 Last data filed at 09/30/15 1041  Gross per 24 hour  Intake   1580 ml  Output    300 ml  Net   1280 ml   Filed Weights   09/29/15 1213 09/29/15 1715 09/30/15 0500  Weight: 107.502 kg (237 lb) 107.502 kg (237 lb) 107.68 kg (237 lb 6.3 oz)    Exam: General exam: In no acute distress. Respiratory system: Good air movement and clear to auscultation. Cardiovascular system: S1 & S2 heard, RRR.  Gastrointestinal system: Abdomen is nondistended, soft and nontender.  Central nervous system: Alert and oriented. No focal neurological deficits. Extremities: No pedal edema. Skin: No rashes, lesions or ulcers Psychiatry: Judgement and insight appear normal. Mood & affect appropriate.    Data Reviewed:    Labs: Basic Metabolic Panel:  Recent Labs Lab 09/29/15 1234 09/30/15 0410  NA 132* 133*  K 3.6 3.0*  CL 94* 97*  CO2 26 27  GLUCOSE 148* 136*  BUN 18 8  CREATININE 0.85 0.69  CALCIUM  10.0 8.9   GFR Estimated Creatinine Clearance: 86.2 mL/min (by C-G formula based on Cr of 0.69). Liver Function Tests:  Recent Labs Lab 09/29/15 1234  AST 33  ALT 30  ALKPHOS 73  BILITOT 0.6  PROT 7.7  ALBUMIN 4.4   No results for input(s): LIPASE, AMYLASE in the last 168 hours. No results for input(s): AMMONIA in the last 168 hours. Coagulation profile No results for input(s): INR, PROTIME in the last 168 hours.  CBC:  Recent Labs Lab 09/29/15 1234 09/30/15 0410  WBC 17.2* 18.5*  HGB 14.8 13.4  HCT 40.5 37.3  MCV 94.2 94.9  PLT 317 283   Cardiac Enzymes: No results for input(s): CKTOTAL, CKMB, CKMBINDEX, TROPONINI in the last 168 hours. BNP (last 3 results) No results for input(s): PROBNP in the last 8760 hours. CBG: No results for input(s): GLUCAP in the last 168 hours. D-Dimer: No results for input(s): DDIMER in the last 72 hours. Hgb A1c: No results for input(s): HGBA1C in the last 72 hours. Lipid Profile: No results for input(s): CHOL, HDL, LDLCALC, TRIG, CHOLHDL, LDLDIRECT in the last 72 hours. Thyroid function studies: No results for input(s): TSH, T4TOTAL, T3FREE,  THYROIDAB in the last 72 hours.  Invalid input(s): FREET3 Anemia work up: No results for input(s): VITAMINB12, FOLATE, FERRITIN, TIBC, IRON, RETICCTPCT in the last 72 hours. Sepsis Labs:  Recent Labs Lab 09/29/15 1234 09/30/15 0410  WBC 17.2* 18.5*   Microbiology Recent Results (from the past 240 hour(s))  C difficile quick scan w PCR reflex     Status: None   Collection Time: 09/29/15  5:31 PM  Result Value Ref Range Status   C Diff antigen NEGATIVE NEGATIVE Final   C Diff toxin NEGATIVE NEGATIVE Final   C Diff interpretation Negative for toxigenic C. difficile  Final  Gastrointestinal Panel by PCR , Stool     Status: None   Collection Time: 09/29/15  5:31 PM  Result Value Ref Range Status   Campylobacter species NOT DETECTED NOT DETECTED Final   Plesimonas shigelloides NOT  DETECTED NOT DETECTED Final   Salmonella species NOT DETECTED NOT DETECTED Final   Yersinia enterocolitica NOT DETECTED NOT DETECTED Final   Vibrio species NOT DETECTED NOT DETECTED Final   Vibrio cholerae NOT DETECTED NOT DETECTED Final   Enteroaggregative E coli (EAEC) NOT DETECTED NOT DETECTED Final   Enteropathogenic E coli (EPEC) NOT DETECTED NOT DETECTED Final   Enterotoxigenic E coli (ETEC) NOT DETECTED NOT DETECTED Final   Shiga like toxin producing E coli (STEC) NOT DETECTED NOT DETECTED Final   E. coli O157 NOT DETECTED NOT DETECTED Final   Shigella/Enteroinvasive E coli (EIEC) NOT DETECTED NOT DETECTED Final   Cryptosporidium NOT DETECTED NOT DETECTED Final   Cyclospora cayetanensis NOT DETECTED NOT DETECTED Final   Entamoeba histolytica NOT DETECTED NOT DETECTED Final   Giardia lamblia NOT DETECTED NOT DETECTED Final   Adenovirus F40/41 NOT DETECTED NOT DETECTED Final   Astrovirus NOT DETECTED NOT DETECTED Final   Norovirus GI/GII NOT DETECTED NOT DETECTED Final   Rotavirus A NOT DETECTED NOT DETECTED Final   Sapovirus (I, II, IV, and V) NOT DETECTED NOT DETECTED Final     Medications:   . amLODipine  10 mg Oral Daily  . aspirin EC  81 mg Oral Daily  . ciprofloxacin  400 mg Intravenous Q12H  . latanoprost  1 drop Both Eyes QHS  . loratadine  10 mg Oral Daily  . metronidazole  500 mg Intravenous Q8H  . vitamin B-6  50 mg Oral Daily  . timolol  1 drop Both Eyes BID  . venlafaxine XR  75 mg Oral Daily   Continuous Infusions: . 0.9 % NaCl with KCl 20 mEq / L 75 mL/hr at 09/30/15 0603    Time spent: 25 min   LOS: 1 day   Charlynne Cousins  [facility] Pager [phone]  *Please refer to [email or web address], password [email or web address] to get updated schedule on who will round on this patient, as hospitalists switch teams weekly. If 7PM-7AM, please contact night-coverage at [email or web address], password [email or web address] for any overnight needs.  09/30/2015, 10:57 AM

## 2015-10-01 DIAGNOSIS — K529 Noninfective gastroenteritis and colitis, unspecified: Secondary | ICD-10-CM | POA: Diagnosis not present

## 2015-10-01 DIAGNOSIS — I10 Essential (primary) hypertension: Secondary | ICD-10-CM

## 2015-10-01 DIAGNOSIS — E871 Hypo-osmolality and hyponatremia: Secondary | ICD-10-CM

## 2015-10-01 NOTE — Progress Notes (Signed)
[facility] Progress Note   Susan Jenkins  [id number]  DOB: 1945-12-10  DOA: 09/29/2015 PCP: Nyoka Cowden, MD  Brief narrative: Susan Jenkins is a 70 y.o. female with HTN who presents for abdominal pain and bloody stools starting on the day of admission. CT in the ER showed inflammation in transverse and descending colon.    Subjective: No nausea. Having some abdominal pain and continues to have loose stool but no fresh blood. She passed a clot today.   Assessment/Plan: Principal Problem:   Acute colitis - likely viral- C diff and Gi pathogen panel negative - bleeding resolving- cont Aspirin - cont conservative treatment-  clear liquids  Active Problems: Leukocytosis - due to above    Essential hypertension - norvasc    Hyponatremia/ hypochloremia - likely due to dehydration - cont IVF  Hypokalemia - replacing K   Antibiotics: Anti-infectives    Start     Dose/Rate Route Frequency Ordered Stop   09/30/15 0300  ciprofloxacin (CIPRO) IVPB 400 mg     400 mg 200 mL/hr over 60 Minutes Intravenous Every 12 hours 09/29/15 1718     09/29/15 2200  metroNIDAZOLE (FLAGYL) IVPB 500 mg  Status:  Discontinued     500 mg 100 mL/hr over 60 Minutes Intravenous Every 8 hours 09/29/15 1718 10/01/15 0941   09/29/15 1345  ciprofloxacin (CIPRO) IVPB 400 mg     400 mg 200 mL/hr over 60 Minutes Intravenous  Once 09/29/15 1332 09/29/15 1702   09/29/15 1345  metroNIDAZOLE (FLAGYL) IVPB 500 mg     500 mg 100 mL/hr over 60 Minutes Intravenous  Once 09/29/15 1332 09/29/15 1517     Code Status:     Code Status Orders        Start     Ordered   09/29/15 1719  Full code   Continuous     09/29/15 1718    Code Status History    Date Active Date Inactive Code Status Order ID Comments User Context   This patient has a current code status but no historical code status.    Advance Directive Documentation        Most Recent Value   Type of Advance Directive   Healthcare Power of Attorney, Living will   Pre-existing out of facility DNR order (yellow form or pink MOST form)     "MOST" Form in Place?       Family Communication:  Disposition Plan: home in 1-2 days DVT prophylaxis: SCDs Consultants: none Procedures: none    Objective: Filed Weights   09/29/15 1715 09/30/15 0500 10/01/15 0500  Weight: 107.502 kg (237 lb) 107.68 kg (237 lb 6.3 oz) 104.6 kg (230 lb 9.6 oz)    Intake/Output Summary (Last 24 hours) at 10/01/15 1522 Last data filed at 10/01/15 0600  Gross per 24 hour  Intake   2060 ml  Output      0 ml  Net   2060 ml     Vitals Filed Vitals:   09/30/15 2153 10/01/15 0500 10/01/15 1021 10/01/15 1321  BP: 168/68 129/60 129/72 132/71  Pulse: 88 71  71  Temp: 100.5 F (38.1 C) 98.9 F (37.2 C)  97.5 F (36.4 C)  TempSrc: Oral Oral  Oral  Resp: 16 16  16   Height:      Weight:  104.6 kg (230 lb 9.6 oz)    SpO2: 95% 95%  99%    Exam:  General:  Pt is alert, not in acute  distress  HEENT: No icterus, No thrush, oral mucosa moist  Cardiovascular: regular rate and rhythm, S1/S2 No murmur  Respiratory: clear to auscultation bilaterally   Abdomen: Soft, +Bowel sounds, non tender, non distended, no guarding  MSK: No cyanosis or clubbing- no pedal edema   Data Reviewed: Basic Metabolic Panel:  Recent Labs Lab 09/29/15 1234 09/30/15 0410  NA 132* 133*  K 3.6 3.0*  CL 94* 97*  CO2 26 27  GLUCOSE 148* 136*  BUN 18 8  CREATININE 0.85 0.69  CALCIUM 10.0 8.9   Liver Function Tests:  Recent Labs Lab 09/29/15 1234  AST 33  ALT 30  ALKPHOS 73  BILITOT 0.6  PROT 7.7  ALBUMIN 4.4   No results for input(s): LIPASE, AMYLASE in the last 168 hours. No results for input(s): AMMONIA in the last 168 hours. CBC:  Recent Labs Lab 09/29/15 1234 09/30/15 0410  WBC 17.2* 18.5*  HGB 14.8 13.4  HCT 40.5 37.3  MCV 94.2 94.9  PLT 317 283   Cardiac Enzymes: No results for input(s): CKTOTAL, CKMB, CKMBINDEX,  TROPONINI in the last 168 hours. BNP (last 3 results) No results for input(s): BNP in the last 8760 hours.  ProBNP (last 3 results) No results for input(s): PROBNP in the last 8760 hours.  CBG: No results for input(s): GLUCAP in the last 168 hours.  Recent Results (from the past 240 hour(s))  C difficile quick scan w PCR reflex     Status: None   Collection Time: 09/29/15  5:31 PM  Result Value Ref Range Status   C Diff antigen NEGATIVE NEGATIVE Final   C Diff toxin NEGATIVE NEGATIVE Final   C Diff interpretation Negative for toxigenic C. difficile  Final  Gastrointestinal Panel by PCR , Stool     Status: None   Collection Time: 09/29/15  5:31 PM  Result Value Ref Range Status   Campylobacter species NOT DETECTED NOT DETECTED Final   Plesimonas shigelloides NOT DETECTED NOT DETECTED Final   Salmonella species NOT DETECTED NOT DETECTED Final   Yersinia enterocolitica NOT DETECTED NOT DETECTED Final   Vibrio species NOT DETECTED NOT DETECTED Final   Vibrio cholerae NOT DETECTED NOT DETECTED Final   Enteroaggregative E coli (EAEC) NOT DETECTED NOT DETECTED Final   Enteropathogenic E coli (EPEC) NOT DETECTED NOT DETECTED Final   Enterotoxigenic E coli (ETEC) NOT DETECTED NOT DETECTED Final   Shiga like toxin producing E coli (STEC) NOT DETECTED NOT DETECTED Final   E. coli O157 NOT DETECTED NOT DETECTED Final   Shigella/Enteroinvasive E coli (EIEC) NOT DETECTED NOT DETECTED Final   Cryptosporidium NOT DETECTED NOT DETECTED Final   Cyclospora cayetanensis NOT DETECTED NOT DETECTED Final   Entamoeba histolytica NOT DETECTED NOT DETECTED Final   Giardia lamblia NOT DETECTED NOT DETECTED Final   Adenovirus F40/41 NOT DETECTED NOT DETECTED Final   Astrovirus NOT DETECTED NOT DETECTED Final   Norovirus GI/GII NOT DETECTED NOT DETECTED Final   Rotavirus A NOT DETECTED NOT DETECTED Final   Sapovirus (I, II, IV, and V) NOT DETECTED NOT DETECTED Final     Studies: No results  found.  Scheduled Meds:  Scheduled Meds: . amLODipine  10 mg Oral Daily  . aspirin EC  81 mg Oral Daily  . ciprofloxacin  400 mg Intravenous Q12H  . latanoprost  1 drop Both Eyes QHS  . loratadine  10 mg Oral Daily  . vitamin B-6  50 mg Oral Daily  . timolol  1 drop  Both Eyes BID  . venlafaxine XR  75 mg Oral Daily   Continuous Infusions: . 0.9 % NaCl with KCl 40 mEq / L 75 mL/hr (10/01/15 [id number])    Time spent on care of this patient: 86 min   [place], MD 10/01/2015, 3:22 PM  LOS: 2 days   [facility] Office  [phone] Pager - Text Page per [email or web address] If 7PM-7AM, please contact night-coverage [email or web address]

## 2015-10-02 DIAGNOSIS — E871 Hypo-osmolality and hyponatremia: Secondary | ICD-10-CM | POA: Diagnosis not present

## 2015-10-02 DIAGNOSIS — I10 Essential (primary) hypertension: Secondary | ICD-10-CM | POA: Diagnosis not present

## 2015-10-02 DIAGNOSIS — K529 Noninfective gastroenteritis and colitis, unspecified: Secondary | ICD-10-CM | POA: Diagnosis not present

## 2015-10-02 MED ORDER — POTASSIUM CHLORIDE CRYS ER 20 MEQ PO TBCR
40.0000 meq | EXTENDED_RELEASE_TABLET | ORAL | Status: DC
  Administered 2015-10-02: 40 meq via ORAL
  Filled 2015-10-02: qty 2

## 2015-10-02 NOTE — Discharge Instructions (Signed)
Low-Fiber Diet °Fiber is found in fruits, vegetables, and whole grains. A low-fiber diet restricts fibrous foods that are not digested in the small intestine. A diet containing about 10-15 grams of fiber per day is considered low fiber. Low-fiber diets may be used to: °· Promote healing and rest the bowel during intestinal flare-ups. °· Prevent blockage of a partially obstructed or narrowed gastrointestinal tract. °· Reduce fecal weight and volume. °· Slow the movement of feces. °You may be on a low-fiber diet as a transitional diet following surgery, after an injury (trauma), or because of a short (acute) or lifelong (chronic) illness. Your health care provider will determine the length of time you need to stay on this diet.  °WHAT DO I NEED TO KNOW ABOUT A LOW-FIBER DIET? °Always check the fiber content on the packaging's Nutrition Facts label, especially on foods from the grains list. Ask your dietitian if you have questions about specific foods that are related to your condition, especially if the food is not listed below. In general, a low-fiber food will have less than 2 g of fiber. °WHAT FOODS CAN I EAT? °Grains °All breads and crackers made with white flour. Sweet rolls, doughnuts, waffles, pancakes, French toast, bagels. Pretzels, Melba toast, zwieback. Well-cooked cereals, such as cornmeal, farina, or cream cereals. Dry cereals that do not contain whole grains, fruit, or nuts, such as refined corn, wheat, rice, and oat cereals. Potatoes prepared any way without skins, plain pastas and noodles, refined white rice. Use white flour for baking and making sauces. Use allowed list of grains for casseroles, dumplings, and puddings.  °Vegetables °Strained tomato and vegetable juices. Fresh lettuce, cucumber, spinach. Well-cooked (no skin or pulp) or canned vegetables, such as asparagus, bean sprouts, beets, carrots, green beans, mushrooms, potatoes, pumpkin, spinach, yellow squash, tomato sauce/puree, turnips,  yams, and zucchini. Keep servings limited to ½ cup.  °Fruits °All fruit juices except prune juice. Cooked or canned fruits without skin and seeds, such as applesauce, apricots, cherries, fruit cocktail, grapefruit, grapes, mandarin oranges, melons, peaches, pears, pineapple, and plums. Fresh fruits without skin, such as apricots, avocados, bananas, melons, pineapple, nectarines, and peaches. Keep servings limited to ½ cup or 1 piece.   °Meat and Other Protein Sources °Ground or well-cooked tender beef, ham, veal, lamb, pork, or poultry. Eggs, plain cheese. Fish, oysters, shrimp, lobster, and other seafood. Liver, organ meats. Smooth nut butters. °Dairy °All milk products and alternative dairy substitutes, such as soy, rice, almond, and coconut, not containing added whole nuts, seeds, or added fruit. °Beverages °Decaf coffee, fruit, and vegetable juices or smoothies (small amounts, with no pulp or skins, and with fruits from allowed list), sports drinks, herbal tea. °Condiments °Ketchup, mustard, vinegar, cream sauce, cheese sauce, cocoa powder. Spices in moderation, such as allspice, basil, bay leaves, celery powder or leaves, cinnamon, cumin powder, curry powder, ginger, mace, marjoram, onion or garlic powder, oregano, paprika, parsley flakes, ground pepper, rosemary, sage, savory, tarragon, thyme, and turmeric. °Sweets and Desserts °Plain cakes and cookies, pie made with allowed fruit, pudding, custard, cream pie. Gelatin, fruit, ice, sherbet, frozen ice pops. Ice cream, ice milk without nuts. Plain hard candy, honey, jelly, molasses, syrup, sugar, chocolate syrup, gumdrops, marshmallows. Limit overall sugar intake.  °Fats and Oil °Margarine, butter, cream, mayonnaise, salad oils, plain salad dressings made from allowed foods. Choose healthy fats such as olive oil, canola oil, and omega-3 fatty acids (such as found in salmon or tuna) when possible.  °Other °Bouillon, broth, or cream soups made   from allowed foods.  Any strained soup. Casseroles or mixed dishes made with allowed foods. The items listed above may not be a complete list of recommended foods or beverages. Contact your dietitian for more options.  WHAT FOODS ARE NOT RECOMMENDED? Grains All whole wheat and whole grain breads and crackers. Multigrains, rye, bran seeds, nuts, or coconut. Cereals containing whole grains, multigrains, bran, coconut, nuts, raisins. Cooked or dry oatmeal, steel-cut oats. Coarse wheat cereals, granola. Cereals advertised as high fiber. Potato skins. Whole grain pasta, wild or brown rice. Popcorn. Coconut flour. Bran, buckwheat, corn bread, multigrains, rye, wheat germ.  Vegetables Fresh, cooked or canned vegetables, such as artichokes, asparagus, beet greens, broccoli, Brussels sprouts, cabbage, celery, cauliflower, corn, eggplant, kale, legumes or beans, okra, peas, and tomatoes. Avoid large servings of any vegetables, especially raw vegetables.  Fruits Fresh fruits, such as apples with or without skin, berries, cherries, figs, grapes, grapefruit, guavas, kiwis, mangoes, oranges, papayas, pears, persimmons, pineapple, and pomegranate. Prune juice and juices with pulp, stewed or dried prunes. Dried fruits, dates, raisins. Fruit seeds or skins. Avoid large servings of all fresh fruits. Meats and Other Protein Sources Tough, fibrous meats with gristle. Chunky nut butter. Cheese made with seeds, nuts, or other foods not recommended. Nuts, seeds, legumes (beans, including baked beans), dried peas, beans, lentils.  Dairy Yogurt or cheese that contains nuts, seeds, or added fruit.  Beverages Fruit juices with high pulp, prune juice. Caffeinated coffee and teas.  Condiments Coconut, maple syrup, pickles, olives. Sweets and Desserts Desserts, cookies, or candies that contain nuts or coconut, chunky peanut butter, dried fruits. Jams, preserves with seeds, marmalade. Large amounts of sugar and sweets. Any other dessert made with  fruits from the not recommended list.  Other Soups made from vegetables that are not recommended or that contain other foods not recommended.  The items listed above may not be a complete list of foods and beverages to avoid. Contact your dietitian for more information.   This information is not intended to replace advice given to you by your health care provider. Make sure you discuss any questions you have with your health care provider.   Document Released: 10/09/2001 Document Revised: 04/24/2013 Document Reviewed: 03/12/2013 Low-Fiber Diet Fiber is found in fruits, vegetables, and whole grains. A low-fiber diet restricts fibrous foods that are not digested in the small intestine. A diet containing about 10-15 grams of fiber per day is considered low fiber. Low-fiber diets may be used to:  Promote healing and rest the bowel during intestinal flare-ups.  Prevent blockage of a partially obstructed or narrowed gastrointestinal tract.  Reduce fecal weight and volume.  Slow the movement of feces. You may be on a low-fiber diet as a transitional diet following surgery, after an injury (trauma), or because of a short (acute) or lifelong (chronic) illness. Your health care provider will determine the length of time you need to stay on this diet.  WHAT DO I NEED TO KNOW ABOUT A LOW-FIBER DIET? Always check the fiber content on the packaging's Nutrition Facts label, especially on foods from the grains list. Ask your dietitian if you have questions about specific foods that are related to your condition, especially if the food is not listed below. In general, a low-fiber food will have less than 2 g of fiber. WHAT FOODS CAN I EAT? Grains All breads and crackers made with white flour. Sweet rolls, doughnuts, waffles, pancakes, [place] toast, bagels. Pretzels, Melba toast, zwieback. Well-cooked cereals, such as cornmeal, farina,  or cream cereals. Dry cereals that do not contain whole grains, fruit, or  nuts, such as refined corn, wheat, rice, and oat cereals. Potatoes prepared any way without skins, plain pastas and noodles, refined white rice. Use white flour for baking and making sauces. Use allowed list of grains for casseroles, dumplings, and puddings.  Vegetables Strained tomato and vegetable juices. Fresh lettuce, cucumber, spinach. Well-cooked (no skin or pulp) or canned vegetables, such as asparagus, bean sprouts, beets, carrots, green beans, mushrooms, potatoes, pumpkin, spinach, yellow squash, tomato sauce/puree, turnips, yams, and zucchini. Keep servings limited to  cup.  Fruits All fruit juices except prune juice. Cooked or canned fruits without skin and seeds, such as applesauce, apricots, cherries, fruit cocktail, grapefruit, grapes, mandarin oranges, melons, peaches, pears, pineapple, and plums. Fresh fruits without skin, such as apricots, avocados, bananas, melons, pineapple, nectarines, and peaches. Keep servings limited to  cup or 1 piece.  Meat and Other Protein Sources Ground or well-cooked tender beef, ham, veal, lamb, pork, or poultry. Eggs, plain cheese. Fish, oysters, shrimp, lobster, and other seafood. Liver, organ meats. Smooth nut butters. Dairy All milk products and alternative dairy substitutes, such as soy, rice, almond, and coconut, not containing added whole nuts, seeds, or added fruit. Beverages Decaf coffee, fruit, and vegetable juices or smoothies (small amounts, with no pulp or skins, and with fruits from allowed list), sports drinks, herbal tea. Condiments Ketchup, mustard, vinegar, cream sauce, cheese sauce, cocoa powder. Spices in moderation, such as allspice, basil, bay leaves, celery powder or leaves, cinnamon, cumin powder, curry powder, ginger, mace, marjoram, onion or garlic powder, oregano, paprika, parsley flakes, ground pepper, rosemary, sage, savory, tarragon, thyme, and turmeric. Sweets and Desserts Plain cakes and cookies, pie made with allowed  fruit, pudding, custard, cream pie. Gelatin, fruit, ice, sherbet, frozen ice pops. Ice cream, ice milk without nuts. Plain hard candy, honey, jelly, molasses, syrup, sugar, chocolate syrup, gumdrops, marshmallows. Limit overall sugar intake.  Fats and Oil Margarine, butter, cream, mayonnaise, salad oils, plain salad dressings made from allowed foods. Choose healthy fats such as olive oil, canola oil, and omega-3 fatty acids (such as found in salmon or tuna) when possible.  Other Bouillon, broth, or cream soups made from allowed foods. Any strained soup. Casseroles or mixed dishes made with allowed foods. The items listed above may not be a complete list of recommended foods or beverages. Contact your dietitian for more options.  WHAT FOODS ARE NOT RECOMMENDED? Grains All whole wheat and whole grain breads and crackers. Multigrains, rye, bran seeds, nuts, or coconut. Cereals containing whole grains, multigrains, bran, coconut, nuts, raisins. Cooked or dry oatmeal, steel-cut oats. Coarse wheat cereals, granola. Cereals advertised as high fiber. Potato skins. Whole grain pasta, wild or brown rice. Popcorn. Coconut flour. Bran, buckwheat, corn bread, multigrains, rye, wheat germ.  Vegetables Fresh, cooked or canned vegetables, such as artichokes, asparagus, beet greens, broccoli, Brussels sprouts, cabbage, celery, cauliflower, corn, eggplant, kale, legumes or beans, okra, peas, and tomatoes. Avoid large servings of any vegetables, especially raw vegetables.  Fruits Fresh fruits, such as apples with or without skin, berries, cherries, figs, grapes, grapefruit, guavas, kiwis, mangoes, oranges, papayas, pears, persimmons, pineapple, and pomegranate. Prune juice and juices with pulp, stewed or dried prunes. Dried fruits, dates, raisins. Fruit seeds or skins. Avoid large servings of all fresh fruits. Meats and Other Protein Sources Tough, fibrous meats with gristle. Chunky nut butter. Cheese made with seeds,  nuts, or other foods not recommended. Nuts, seeds, legumes (beans,  including baked beans), dried peas, beans, lentils.  Dairy Yogurt or cheese that contains nuts, seeds, or added fruit.  Beverages Fruit juices with high pulp, prune juice. Caffeinated coffee and teas.  Condiments Coconut, maple syrup, pickles, olives. Sweets and Desserts Desserts, cookies, or candies that contain nuts or coconut, chunky peanut butter, dried fruits. Jams, preserves with seeds, marmalade. Large amounts of sugar and sweets. Any other dessert made with fruits from the not recommended list.  Other Soups made from vegetables that are not recommended or that contain other foods not recommended.  The items listed above may not be a complete list of foods and beverages to avoid. Contact your dietitian for more information.   This information is not intended to replace advice given to you by your health care provider. Make sure you discuss any questions you have with your health care provider.   Document Released: 10/09/2001 Document Revised: 04/24/2013 Document Reviewed: 03/12/2013 Elsevier Interactive Patient Education 2016 [employer]. Chartered certified accountant Patient Education [employer].

## 2015-10-02 NOTE — Care Management Important Message (Signed)
Important Message  Patient Details  Name: MARAL BLAUER MRN: [id number] Date of Birth: 03-14-1946   Medicare Important Message Given:  Yes    Camillo Flaming 10/02/2015, 10:15 AMImportant Message  Patient Details  Name: CALIANA FAILS MRN: [id number] Date of Birth: 1946-02-12   Medicare Important Message Given:  Yes    Camillo Flaming 10/02/2015, 10:15 AM

## 2015-10-02 NOTE — Progress Notes (Signed)
[facility] Progress Note   Susan Jenkins  [id number]  DOB: 1945/09/02  DOA: 09/29/2015 PCP: Nyoka Cowden, MD  Brief narrative: Susan Jenkins is a 70 y.o. female with HTN who presents for abdominal pain and bloody stools starting on the day of admission. CT in the ER showed inflammation in transverse and descending colon.    Subjective: No nausea or abdominal pain. No bloody diarrhea in > 24 hrs- small smear of brown stool today  Assessment/Plan: Principal Problem:   Acute colitis - likely viral- C diff and Gi pathogen panel negative - bleeding resolving- cont Aspirin - cont conservative treatment-  Advance to low residue diet today  Active Problems: Leukocytosis - due to above- follow    Essential hypertension - norvasc    Hyponatremia/ hypochloremia - likely due to dehydration - cont IVF  Hypokalemia - replacing K   Antibiotics: Anti-infectives    Start     Dose/Rate Route Frequency Ordered Stop   09/30/15 0300  ciprofloxacin (CIPRO) IVPB 400 mg  Status:  Discontinued     400 mg 200 mL/hr over 60 Minutes Intravenous Every 12 hours 09/29/15 1718 10/01/15 1527   09/29/15 2200  metroNIDAZOLE (FLAGYL) IVPB 500 mg  Status:  Discontinued     500 mg 100 mL/hr over 60 Minutes Intravenous Every 8 hours 09/29/15 1718 10/01/15 0941   09/29/15 1345  ciprofloxacin (CIPRO) IVPB 400 mg     400 mg 200 mL/hr over 60 Minutes Intravenous  Once 09/29/15 1332 09/29/15 1702   09/29/15 1345  metroNIDAZOLE (FLAGYL) IVPB 500 mg     500 mg 100 mL/hr over 60 Minutes Intravenous  Once 09/29/15 1332 09/29/15 1517     Code Status:     Code Status Orders        Start     Ordered   09/29/15 1719  Full code   Continuous     09/29/15 1718    Code Status History    Date Active Date Inactive Code Status Order ID Comments User Context   This patient has a current code status but no historical code status.    Advance Directive Documentation        Most Recent  Value   Type of Advance Directive  Healthcare Power of Attorney, Living will   Pre-existing out of facility DNR order (yellow form or pink MOST form)     "MOST" Form in Place?       Family Communication:  Disposition Plan: home in 1-2 days DVT prophylaxis: SCDs Consultants: none Procedures: none    Objective: Filed Weights   09/30/15 0500 10/01/15 0500 10/02/15 0445  Weight: 107.68 kg (237 lb 6.3 oz) 104.6 kg (230 lb 9.6 oz) 106.5 kg (234 lb 12.6 oz)    Intake/Output Summary (Last 24 hours) at 10/02/15 1546 Last data filed at 10/02/15 0600  Gross per 24 hour  Intake 1803.75 ml  Output      0 ml  Net 1803.75 ml     Vitals Filed Vitals:   10/01/15 2118 10/02/15 0040 10/02/15 0445 10/02/15 1417  BP: 145/62  142/61 129/55  Pulse: 75  73 67  Temp: 100.4 F (38 C) 99.9 F (37.7 C) 99.3 F (37.4 C) 99.3 F (37.4 C)  TempSrc: Oral Oral Oral Oral  Resp: 16  16 16   Height:      Weight:   106.5 kg (234 lb 12.6 oz)   SpO2: 97%  97% 97%    Exam:  General:  Pt is alert, not in acute distress  HEENT: No icterus, No thrush, oral mucosa moist  Cardiovascular: regular rate and rhythm, S1/S2 No murmur  Respiratory: clear to auscultation bilaterally   Abdomen: Soft, +Bowel sounds, non tender, non distended, no guarding  MSK: No cyanosis or clubbing- no pedal edema   Data Reviewed: Basic Metabolic Panel:  Recent Labs Lab 09/29/15 1234 09/30/15 0410  NA 132* 133*  K 3.6 3.0*  CL 94* 97*  CO2 26 27  GLUCOSE 148* 136*  BUN 18 8  CREATININE 0.85 0.69  CALCIUM 10.0 8.9   Liver Function Tests:  Recent Labs Lab 09/29/15 1234  AST 33  ALT 30  ALKPHOS 73  BILITOT 0.6  PROT 7.7  ALBUMIN 4.4   No results for input(s): LIPASE, AMYLASE in the last 168 hours. No results for input(s): AMMONIA in the last 168 hours. CBC:  Recent Labs Lab 09/29/15 1234 09/30/15 0410  WBC 17.2* 18.5*  HGB 14.8 13.4  HCT 40.5 37.3  MCV 94.2 94.9  PLT 317 283   Cardiac  Enzymes: No results for input(s): CKTOTAL, CKMB, CKMBINDEX, TROPONINI in the last 168 hours. BNP (last 3 results) No results for input(s): BNP in the last 8760 hours.  ProBNP (last 3 results) No results for input(s): PROBNP in the last 8760 hours.  CBG: No results for input(s): GLUCAP in the last 168 hours.  Recent Results (from the past 240 hour(s))  C difficile quick scan w PCR reflex     Status: None   Collection Time: 09/29/15  5:31 PM  Result Value Ref Range Status   C Diff antigen NEGATIVE NEGATIVE Final   C Diff toxin NEGATIVE NEGATIVE Final   C Diff interpretation Negative for toxigenic C. difficile  Final  Gastrointestinal Panel by PCR , Stool     Status: None   Collection Time: 09/29/15  5:31 PM  Result Value Ref Range Status   Campylobacter species NOT DETECTED NOT DETECTED Final   Plesimonas shigelloides NOT DETECTED NOT DETECTED Final   Salmonella species NOT DETECTED NOT DETECTED Final   Yersinia enterocolitica NOT DETECTED NOT DETECTED Final   Vibrio species NOT DETECTED NOT DETECTED Final   Vibrio cholerae NOT DETECTED NOT DETECTED Final   Enteroaggregative E coli (EAEC) NOT DETECTED NOT DETECTED Final   Enteropathogenic E coli (EPEC) NOT DETECTED NOT DETECTED Final   Enterotoxigenic E coli (ETEC) NOT DETECTED NOT DETECTED Final   Shiga like toxin producing E coli (STEC) NOT DETECTED NOT DETECTED Final   E. coli O157 NOT DETECTED NOT DETECTED Final   Shigella/Enteroinvasive E coli (EIEC) NOT DETECTED NOT DETECTED Final   Cryptosporidium NOT DETECTED NOT DETECTED Final   Cyclospora cayetanensis NOT DETECTED NOT DETECTED Final   Entamoeba histolytica NOT DETECTED NOT DETECTED Final   Giardia lamblia NOT DETECTED NOT DETECTED Final   Adenovirus F40/41 NOT DETECTED NOT DETECTED Final   Astrovirus NOT DETECTED NOT DETECTED Final   Norovirus GI/GII NOT DETECTED NOT DETECTED Final   Rotavirus A NOT DETECTED NOT DETECTED Final   Sapovirus (I, II, IV, and V) NOT  DETECTED NOT DETECTED Final     Studies: No results found.  Scheduled Meds:  Scheduled Meds: . amLODipine  10 mg Oral Daily  . aspirin EC  81 mg Oral Daily  . latanoprost  1 drop Both Eyes QHS  . loratadine  10 mg Oral Daily  . vitamin B-6  50 mg Oral Daily  . timolol  1 drop Both Eyes  BID  . venlafaxine XR  75 mg Oral Daily   Continuous Infusions: . 0.9 % NaCl with KCl 40 mEq / L 100 mL/hr (10/02/15 1256)    Time spent on care of this patient: 66 min   [place], MD 10/02/2015, 3:46 PM  LOS: 3 days   [facility] Office  [phone] Pager - Text Page per [email or web address] If 7PM-7AM, please contact night-coverage [email or web address]

## 2015-10-02 NOTE — Plan of Care (Signed)
Problem: Food- and Nutrition-Related Knowledge Deficit (NB-1.1) Goal: Nutrition education Formal process to instruct or train a patient/client in a skill or to impart knowledge to help patients/clients voluntarily manage or modify food choices and eating behavior to maintain or improve health. Outcome: Completed/Met Date Met:  10/02/15 Nutrition Education Note  RD consulted for nutrition education regarding a low fiber diet for infectious colitis  RD provided "Fiber Restricted Nutrition Therapy" handout from the Academy of [employer] Dietetics. Reviewed patient's dietary recall. Provided examples of low and high fiber foods. Discouraged intake of high fiber foods, processed foods, caffeine and red meats. Encouraged use of a multi-vitamin while following a low fiber diet. Teach back method used.  Expect fair compliance.  Body mass index is 36.76 kg/(m^2). Pt meets criteria for obese class II based on current BMI.  Current diet order is soft, patient is consuming approximately 50% of meals at this time. Labs and medications reviewed. No further nutrition interventions warranted at this time. RD contact information provided. If additional nutrition issues arise, please re-consult RD.  Satira Anis. Ysidra Sopher, MS, RD LDN Inpatient Clinical Dietitian Pager [phone]

## 2015-10-03 ENCOUNTER — Inpatient Hospital Stay (HOSPITAL_COMMUNITY): Payer: PPO

## 2015-10-03 ENCOUNTER — Encounter (HOSPITAL_COMMUNITY): Payer: Self-pay | Admitting: Radiology

## 2015-10-03 DIAGNOSIS — I10 Essential (primary) hypertension: Secondary | ICD-10-CM | POA: Diagnosis not present

## 2015-10-03 DIAGNOSIS — K529 Noninfective gastroenteritis and colitis, unspecified: Secondary | ICD-10-CM | POA: Diagnosis not present

## 2015-10-03 DIAGNOSIS — E871 Hypo-osmolality and hyponatremia: Secondary | ICD-10-CM | POA: Diagnosis not present

## 2015-10-03 LAB — BASIC METABOLIC PANEL
Anion gap: 7 (ref 5–15)
BUN: 6 mg/dL (ref 6–20)
CHLORIDE: 106 mmol/L (ref 101–111)
CO2: 24 mmol/L (ref 22–32)
CREATININE: 0.66 mg/dL (ref 0.44–1.00)
Calcium: 8.5 mg/dL — ABNORMAL LOW (ref 8.9–10.3)
Glucose, Bld: 103 mg/dL — ABNORMAL HIGH (ref 65–99)
POTASSIUM: 4.1 mmol/L (ref 3.5–5.1)
SODIUM: 137 mmol/L (ref 135–145)

## 2015-10-03 LAB — CBC
HCT: 33 % — ABNORMAL LOW (ref 36.0–46.0)
Hemoglobin: 11.5 g/dL — ABNORMAL LOW (ref 12.0–15.0)
MCH: 34.3 pg — ABNORMAL HIGH (ref 26.0–34.0)
MCHC: 34.8 g/dL (ref 30.0–36.0)
MCV: 98.5 fL (ref 78.0–100.0)
PLATELETS: 271 10*3/uL (ref 150–400)
RBC: 3.35 MIL/uL — AB (ref 3.87–5.11)
RDW: 14.1 % (ref 11.5–15.5)
WBC: 17.2 10*3/uL — AB (ref 4.0–10.5)

## 2015-10-03 LAB — CBC WITH DIFFERENTIAL/PLATELET
BASOS ABS: 0 10*3/uL (ref 0.0–0.1)
Basophils Relative: 0 %
Eosinophils Absolute: 0.2 10*3/uL (ref 0.0–0.7)
Eosinophils Relative: 1 %
HEMATOCRIT: 32.7 % — AB (ref 36.0–46.0)
HEMOGLOBIN: 11.2 g/dL — AB (ref 12.0–15.0)
LYMPHS PCT: 16 %
Lymphs Abs: 2.3 10*3/uL (ref 0.7–4.0)
MCH: 33.3 pg (ref 26.0–34.0)
MCHC: 34.3 g/dL (ref 30.0–36.0)
MCV: 97.3 fL (ref 78.0–100.0)
MONO ABS: 1.1 10*3/uL — AB (ref 0.1–1.0)
Monocytes Relative: 8 %
NEUTROS ABS: 10.5 10*3/uL — AB (ref 1.7–7.7)
NEUTROS PCT: 75 %
Platelets: 258 10*3/uL (ref 150–400)
RBC: 3.36 MIL/uL — AB (ref 3.87–5.11)
RDW: 14 % (ref 11.5–15.5)
WBC: 14 10*3/uL — ABNORMAL HIGH (ref 4.0–10.5)

## 2015-10-03 MED ORDER — DIATRIZOATE MEGLUMINE & SODIUM 66-10 % PO SOLN: 15.0000 mL | ORAL | Status: DC | PRN

## 2015-10-03 MED ORDER — IOPAMIDOL (ISOVUE-300) INJECTION 61%
100.0000 mL | Freq: Once | INTRAVENOUS | Status: AC | PRN
  Administered 2015-10-03: 100 mL via INTRAVENOUS

## 2015-10-03 NOTE — Progress Notes (Signed)
[facility] Progress Note   Susan Jenkins  [id number]  DOB: 03/19/1946  DOA: 09/29/2015 PCP: Nyoka Cowden, MD  Brief narrative: Susan Jenkins is a 70 y.o. female with HTN who presents for abdominal pain and bloody stools starting on the day of admission. CT in the ER showed inflammation in transverse and descending colon.    Subjective: Flushed and feverish. No increase in abdominal pain but generally does not feel well  Assessment/Plan: Principal Problem:   Acute colitis - likely viral- C diff and Gi pathogen panel negative - bleeding resolving- cont Aspirin - cont conservative treatment-  Advanced to low residue die - however, clinically does not feel better- still with low grade fever - obtain CT abdomen pelvis today  Active Problems: Leukocytosis - due to above- follow    Essential hypertension - norvasc    Hyponatremia/ hypochloremia - likely due to dehydration - cont IVF  Hypokalemia - replaced K   Antibiotics: Anti-infectives    Start     Dose/Rate Route Frequency Ordered Stop   09/30/15 0300  ciprofloxacin (CIPRO) IVPB 400 mg  Status:  Discontinued     400 mg 200 mL/hr over 60 Minutes Intravenous Every 12 hours 09/29/15 1718 10/01/15 1527   09/29/15 2200  metroNIDAZOLE (FLAGYL) IVPB 500 mg  Status:  Discontinued     500 mg 100 mL/hr over 60 Minutes Intravenous Every 8 hours 09/29/15 1718 10/01/15 0941   09/29/15 1345  ciprofloxacin (CIPRO) IVPB 400 mg     400 mg 200 mL/hr over 60 Minutes Intravenous  Once 09/29/15 1332 09/29/15 1702   09/29/15 1345  metroNIDAZOLE (FLAGYL) IVPB 500 mg     500 mg 100 mL/hr over 60 Minutes Intravenous  Once 09/29/15 1332 09/29/15 1517     Code Status:     Code Status Orders        Start     Ordered   09/29/15 1719  Full code   Continuous     09/29/15 1718    Code Status History    Date Active Date Inactive Code Status Order ID Comments User Context   This patient has a current code status  but no historical code status.    Advance Directive Documentation        Most Recent Value   Type of Advance Directive  Healthcare Power of Attorney, Living will   Pre-existing out of facility DNR order (yellow form or pink MOST form)     "MOST" Form in Place?       Family Communication:  Disposition Plan: home in 1-2 days DVT prophylaxis: SCDs Consultants: none Procedures: none    Objective: Filed Weights   10/01/15 0500 10/02/15 0445 10/03/15 0534  Weight: 104.6 kg (230 lb 9.6 oz) 106.5 kg (234 lb 12.6 oz) 104.5 kg (230 lb 6.1 oz)    Intake/Output Summary (Last 24 hours) at 10/03/15 1135 Last data filed at 10/03/15 1115  Gross per 24 hour  Intake      0 ml  Output    500 ml  Net   -500 ml     Vitals Filed Vitals:   10/02/15 2126 10/02/15 2222 10/02/15 2257 10/03/15 0534  BP: 165/56   144/48  Pulse: 80   71  Temp: 99.6 F (37.6 C) 100.1 F (37.8 C) 99.3 F (37.4 C) 99.1 F (37.3 C)  TempSrc: Oral Oral Oral Oral  Resp: 16   16  Height:      Weight:    104.5  kg (230 lb 6.1 oz)  SpO2: 96%   94%    Exam:  General:  Pt is alert, not in acute distress  HEENT: No icterus, No thrush, oral mucosa moist  Cardiovascular: regular rate and rhythm, S1/S2 No murmur  Respiratory: clear to auscultation bilaterally   Abdomen: Soft, +Bowel sounds, non tender, non distended, no guarding  MSK: No cyanosis or clubbing- no pedal edema   Data Reviewed: Basic Metabolic Panel:  Recent Labs Lab 09/29/15 1234 09/30/15 0410 10/03/15 0403  NA 132* 133* 137  K 3.6 3.0* 4.1  CL 94* 97* 106  CO2 26 27 24   GLUCOSE 148* 136* 103*  BUN 18 8 6   CREATININE 0.85 0.69 0.66  CALCIUM 10.0 8.9 8.5*   Liver Function Tests:  Recent Labs Lab 09/29/15 1234  AST 33  ALT 30  ALKPHOS 73  BILITOT 0.6  PROT 7.7  ALBUMIN 4.4   No results for input(s): LIPASE, AMYLASE in the last 168 hours. No results for input(s): AMMONIA in the last 168 hours. CBC:  Recent Labs Lab  09/29/15 1234 09/30/15 0410 10/03/15 0403 10/03/15 0754  WBC 17.2* 18.5* 17.2* 14.0*  NEUTROABS  --   --   --  10.5*  HGB 14.8 13.4 11.5* 11.2*  HCT 40.5 37.3 33.0* 32.7*  MCV 94.2 94.9 98.5 97.3  PLT 317 283 271 258   Cardiac Enzymes: No results for input(s): CKTOTAL, CKMB, CKMBINDEX, TROPONINI in the last 168 hours. BNP (last 3 results) No results for input(s): BNP in the last 8760 hours.  ProBNP (last 3 results) No results for input(s): PROBNP in the last 8760 hours.  CBG: No results for input(s): GLUCAP in the last 168 hours.  Recent Results (from the past 240 hour(s))  C difficile quick scan w PCR reflex     Status: None   Collection Time: 09/29/15  5:31 PM  Result Value Ref Range Status   C Diff antigen NEGATIVE NEGATIVE Final   C Diff toxin NEGATIVE NEGATIVE Final   C Diff interpretation Negative for toxigenic C. difficile  Final  Gastrointestinal Panel by PCR , Stool     Status: None   Collection Time: 09/29/15  5:31 PM  Result Value Ref Range Status   Campylobacter species NOT DETECTED NOT DETECTED Final   Plesimonas shigelloides NOT DETECTED NOT DETECTED Final   Salmonella species NOT DETECTED NOT DETECTED Final   Yersinia enterocolitica NOT DETECTED NOT DETECTED Final   Vibrio species NOT DETECTED NOT DETECTED Final   Vibrio cholerae NOT DETECTED NOT DETECTED Final   Enteroaggregative E coli (EAEC) NOT DETECTED NOT DETECTED Final   Enteropathogenic E coli (EPEC) NOT DETECTED NOT DETECTED Final   Enterotoxigenic E coli (ETEC) NOT DETECTED NOT DETECTED Final   Shiga like toxin producing E coli (STEC) NOT DETECTED NOT DETECTED Final   E. coli O157 NOT DETECTED NOT DETECTED Final   Shigella/Enteroinvasive E coli (EIEC) NOT DETECTED NOT DETECTED Final   Cryptosporidium NOT DETECTED NOT DETECTED Final   Cyclospora cayetanensis NOT DETECTED NOT DETECTED Final   Entamoeba histolytica NOT DETECTED NOT DETECTED Final   Giardia lamblia NOT DETECTED NOT DETECTED Final    Adenovirus F40/41 NOT DETECTED NOT DETECTED Final   Astrovirus NOT DETECTED NOT DETECTED Final   Norovirus GI/GII NOT DETECTED NOT DETECTED Final   Rotavirus A NOT DETECTED NOT DETECTED Final   Sapovirus (I, II, IV, and V) NOT DETECTED NOT DETECTED Final     Studies: No results found.  Scheduled  Meds:  Scheduled Meds: . amLODipine  10 mg Oral Daily  . aspirin EC  81 mg Oral Daily  . latanoprost  1 drop Both Eyes QHS  . loratadine  10 mg Oral Daily  . vitamin B-6  50 mg Oral Daily  . timolol  1 drop Both Eyes BID  . venlafaxine XR  75 mg Oral Daily   Continuous Infusions: . 0.9 % NaCl with KCl 40 mEq / L 100 mL/hr (10/02/15 2303)    Time spent on care of this patient: 75 min   Palmer, MD 10/03/2015, 11:35 AM  LOS: 4 days   [facility] Office  [phone] Pager - Text Page per [email or web address] If 7PM-7AM, please contact night-coverage [email or web address]

## 2015-10-04 DIAGNOSIS — E871 Hypo-osmolality and hyponatremia: Secondary | ICD-10-CM | POA: Diagnosis not present

## 2015-10-04 DIAGNOSIS — I10 Essential (primary) hypertension: Secondary | ICD-10-CM | POA: Diagnosis not present

## 2015-10-04 DIAGNOSIS — K529 Noninfective gastroenteritis and colitis, unspecified: Secondary | ICD-10-CM | POA: Diagnosis not present

## 2015-10-04 LAB — CBC
HEMATOCRIT: 33.4 % — AB (ref 36.0–46.0)
HEMOGLOBIN: 11.6 g/dL — AB (ref 12.0–15.0)
MCH: 33.7 pg (ref 26.0–34.0)
MCHC: 34.7 g/dL (ref 30.0–36.0)
MCV: 97.1 fL (ref 78.0–100.0)
Platelets: 275 10*3/uL (ref 150–400)
RBC: 3.44 MIL/uL — ABNORMAL LOW (ref 3.87–5.11)
RDW: 13.9 % (ref 11.5–15.5)
WBC: 13.3 10*3/uL — AB (ref 4.0–10.5)

## 2015-10-04 MED ORDER — SACCHAROMYCES BOULARDII 250 MG PO CAPS: 250.0000 mg | ORAL_CAPSULE | Freq: Two times a day (BID) | ORAL | Status: DC

## 2015-10-04 NOTE — Progress Notes (Signed)
Patient alert and oriented with pain controlled. Patient given discharge instructions. All questions and concerns answered. Patient verbalized understanding of instructions.

## 2015-10-07 NOTE — Discharge Summary (Signed)
Physician Discharge Summary  Susan Jenkins [id number] DOB: 12-30-1945 DOA: 09/29/2015  PCP: Nyoka Cowden, MD  Admit date: 09/29/2015 Discharge date: 10/07/2015  Time spent: 55 minutes  Recommendations for Outpatient Follow-up:  1. Gi f/u in 2 wks   Discharge Condition: stable    Discharge Diagnoses:  Principal Problem:   Acute colitis Active Problems:   Essential hypertension   Hyponatremia   History of present illness:  Susan Jenkins is a 70 y.o. female with HTN who presents for abdominal pain and bloody stools starting on the day of admission. CT in the ER showed inflammation in transverse and descending colon.   Hospital Course:  Principal Problem:  Acute colitis - likely viral- C diff and Gi pathogen panel negative - bleeding resolving- cont Aspirin --  CT abdomen pelvis repeated as WBC count was slow to improve and she continued to have fever and chills- did not show abscess but persistent colitis - cont conservative treatment- Advanced to low residue diet which she is tolerating well - recommend f/u with her GI doctor - appt made with Dr Earlean Shawl for 2 wks from now   Active Problems: Leukocytosis - due to above- improving   Essential hypertension - norvasc   Hyponatremia/ hypochloremia - likely due to dehydration - improving with IVF  Hypokalemia - replaced K   Procedures: None  Consultations:  none  Discharge Exam: Filed Weights   10/02/15 0445 10/03/15 0534 10/04/15 0500  Weight: 106.5 kg (234 lb 12.6 oz) 104.5 kg (230 lb 6.1 oz) 103.9 kg (229 lb 0.9 oz)   Filed Vitals:   10/03/15 2120 10/04/15 0541  BP: 149/68 138/54  Pulse: 70 69  Temp: 99.3 F (37.4 C) 98.5 F (36.9 C)  Resp: 16 16    General: AAO x 3, no distress Cardiovascular: RRR, no murmurs  Respiratory: clear to auscultation bilaterally GI: soft, non-tender, non-distended, bowel sound positive  Discharge Instructions You were cared for by a hospitalist  during your hospital stay. If you have any questions about your discharge medications or the care you received while you were in the hospital after you are discharged, you can call the unit and asked to speak with the hospitalist on call if the hospitalist that took care of you is not available. Once you are discharged, your primary care physician will handle any further medical issues. Please note that NO REFILLS for any discharge medications will be authorized once you are discharged, as it is imperative that you return to your primary care physician (or establish a relationship with a primary care physician if you do not have one) for your aftercare needs so that they can reassess your need for medications and monitor your lab values.  Discharge Instructions    Diet - low sodium heart healthy    Complete by:  As directed      Discharge instructions    Complete by:  As directed   Strictly low fiber diet for 2 wks Return to ER if you have bloody stool, fevers > 101 (make sure to check temperature) uncontrollable abdominal pain or vomting     Increase activity slowly    Complete by:  As directed             Medication List    STOP taking these medications        azithromycin 250 MG tablet  Commonly known as:  ZITHROMAX      TAKE these medications        amLODipine  10 MG tablet  Commonly known as:  NORVASC  TAKE 1 TABLET BY MOUTH EVERY DAY     aspirin 81 MG tablet  Take 81 mg by mouth daily.     Calcium Carbonate-Vit D-Min 600-400 MG-UNIT Tabs  Take 1 tablet by mouth daily.     docusate sodium 100 MG capsule  Commonly known as:  COLACE  Take 100 mg by mouth 2 (two) times daily as needed for constipation.     FISH OIL PO  Take 1 capsule by mouth daily.     GARLIQUE 400 MG Tbec  Generic drug:  Garlic  Take 1 tablet by mouth at bedtime.     hydrochlorothiazide 25 MG tablet  Commonly known as:  HYDRODIURIL  Take 1 tablet (25 mg total) by mouth daily.      HYDROcodone-homatropine 5-1.5 MG/5ML syrup  Commonly known as:  HYCODAN  Take 5 mLs by mouth every 8 (eight) hours as needed for cough.     KAOPECTATE PO  Take 10 mLs by mouth every 6 (six) hours as needed (for nausea).     latanoprost 0.005 % ophthalmic solution  Commonly known as:  XALATAN  Place 1 drop into both eyes at bedtime.     loratadine 10 MG tablet  Commonly known as:  CLARITIN  Take 10 mg by mouth daily.     Melatonin 1 MG Tabs  Take 1 tablet by mouth at bedtime.     multivitamin capsule  Take 1 capsule by mouth daily.     OSTEO BI-FLEX TRIPLE STRENGTH PO  Take 1 tablet by mouth 2 (two) times daily.     pyridOXINE 50 MG tablet  Commonly known as:  B-6  Take 50 mg by mouth daily.     rosuvastatin 10 MG tablet  Commonly known as:  CRESTOR  TAKE 1 TABLET BY MOUTH EVERY DAY     saccharomyces boulardii 250 MG capsule  Commonly known as:  FLORASTOR  Take 1 capsule (250 mg total) by mouth 2 (two) times daily.     timolol 0.5 % ophthalmic solution  Commonly known as:  TIMOPTIC  Place 1 drop into both eyes 2 (two) times daily.     venlafaxine XR 75 MG 24 hr capsule  Commonly known as:  EFFEXOR-XR  TAKE ONE CAPSULE BY MOUTH EVERY DAY       Allergies  Allergen Reactions  . Amoxicillin-Pot Clavulanate Anaphylaxis    Has patient had a PCN reaction causing immediate rash, facial/tongue/throat swelling, SOB or lightheadedness with hypotension: Yes Has patient had a PCN reaction causing severe rash involving mucus membranes or skin necrosis: Yes Has patient had a PCN reaction that required hospitalization Yes Has patient had a PCN reaction occurring within the last 10 years: No If all of the above answers are "NO", then may proceed with Cephalosporin use.       Follow-up Information    Follow up with MEDOFF,JEFFREY R, MD In 2 weeks.   Specialty:  Gastroenterology   Why:  Appt is Friday 16th at 8:30am   Contact information:   [place] [id number] [phone]        The results of significant diagnostics from this hospitalization (including imaging, microbiology, ancillary and laboratory) are listed below for reference.    Significant Diagnostic Studies: Ct Abdomen Pelvis W Contrast  10/03/2015  CLINICAL DATA:  Colitis since Monday. Finished antibiotics. Now with progressive abdominal pain. EXAM: CT ABDOMEN AND PELVIS WITH CONTRAST TECHNIQUE:  Multidetector CT imaging of the abdomen and pelvis was performed using the standard protocol following bolus administration of intravenous contrast. CONTRAST:  152mL ISOVUE-300 IOPAMIDOL (ISOVUE-300) INJECTION 61% COMPARISON:  09/29/2015 FINDINGS: Lower chest: Increased interstitial markings within the lung bases compatible with pulmonary edema. Small right pleural effusion noted. Hepatobiliary: 4.3 cm left lobe of liver cyst. No suspicious liver abnormalities. The gallbladder appears normal. No biliary dilatation. Pancreas: Normal appearance of the pancreas. Spleen: Within normal limits in size and appearance. Adrenals/Urinary Tract: The adrenal glands are normal. Normal appearance of both kidneys. The urinary bladder is unremarkable. Stomach/Bowel: The stomach is normal. The small bowel loops are unremarkable. No abnormal bowel dilatation identified. There is wall thickening involving the distal transverse colon into the distal descending colon and sigmoid colon compatible with colitis. There is no pneumatosis identified. No evidence for bowel perforation or abscess. No portal venous gas. Vascular/Lymphatic: Normal appearance of the abdominal aorta. No enlarged retroperitoneal or mesenteric adenopathy. No enlarged pelvic or inguinal lymph nodes. Reproductive: The uterus and adnexal structures are unremarkable. Other: There is fat stranding identified along the left pericolic gutter. No significant free fluid or fluid collections. Musculoskeletal: Degenerative disc disease is  identified within the lumbar spine. This is most advanced at the or 5 level. IMPRESSION: 1. Again identified are changes of colitis. This involves the distal transverse colon through the sigmoid colon. No pneumatosis, portal venous gas, or evidence of bowel perforation. No abscess. Electronically Signed   By: Kerby Moors M.D.   On: 10/03/2015 14:36   Ct Abdomen Pelvis W Contrast  09/29/2015  CLINICAL DATA:  70 year old female with history of bright red rectal bleeding and mucus in the stools since yesterday. History of hemorrhoids. Intermittent abdominal pain. EXAM: CT ABDOMEN AND PELVIS WITH CONTRAST TECHNIQUE: Multidetector CT imaging of the abdomen and pelvis was performed using the standard protocol following bolus administration of intravenous contrast. CONTRAST:  1106mL ISOVUE-300 IOPAMIDOL (ISOVUE-300) INJECTION 61% COMPARISON:  No priors. FINDINGS: Lower chest: Trace amount of pericardial fluid and/or thickening, unlikely to be of any hemodynamic significance at this time. No associated pericardial calcification. Hepatobiliary: 3.4 x 4.4 cm well-defined low-attenuation lesion in the left lobe of the liver between segments 2 and 3 is compatible with a simple cyst. Adjacent to this within segment 2 there is a 7 mm low attenuation lesion which is too small to characterize, but statistically likely to represent a tiny cyst. No other suspicious hepatic lesions. No intra or extrahepatic biliary ductal dilatation. Gallbladder is normal in appearance. Pancreas: No pancreatic mass. No pancreatic ductal dilatation. No pancreatic or peripancreatic fluid or inflammatory changes. Spleen: Unremarkable. Adrenals/Urinary Tract: Bilateral adrenal glands and bilateral kidneys are normal in appearance. No hydroureteronephrosis. Urinary bladder is normal in appearance. Stomach/Bowel: Normal appearance of the stomach. There is no pathologic dilatation of small bowel or colon. Numerous colonic diverticulae are noted, most  evident in the sigmoid colon. No definite surrounding inflammatory changes to suggest an acute diverticulitis at this time. However, there is extensive colonic wall thickening, most evident in the distal transverse colon, splenic flexure and descending colon, where there is also some very subtle surrounding inflammatory changes, compatible with an acute colitis. Normal appendix. Vascular/Lymphatic: Atherosclerosis throughout the abdominal and pelvic vasculature, without evidence of aneurysm or dissection. No lymphadenopathy noted in the abdomen or pelvis. Reproductive: Uterus and ovaries are unremarkable in appearance. Other: No significant volume of ascites.  No pneumoperitoneum. Musculoskeletal: There are no aggressive appearing lytic or blastic lesions noted in the visualized  portions of the skeleton. IMPRESSION: 1. Colonic wall thickening and surrounding inflammatory changes extending from the distal transverse colon into the distal descending colon, compatible with an acute colitis. 2. There is also evidence of colonic diverticulosis, most apparent in the sigmoid colon, without definitive findings to suggest an acute diverticulitis at this time. 3. Normal appendix. 4. Mild atherosclerosis. 5. Additional incidental findings, as above. Electronically Signed   By: Vinnie Langton M.D.   On: 09/29/2015 15:04   Dg Foot Complete Left  09/24/2015  CLINICAL DATA:  Injury 2 weeks ago with pain over the second and fifth metatarsals. EXAM: LEFT FOOT - COMPLETE 3+ VIEW COMPARISON:  None. FINDINGS: There is no evidence of fracture or dislocation. There is plantar calcaneal spur. Decreased joint space is identified in the distal interphalangeal joints of the toes. Soft tissues are unremarkable. IMPRESSION: No acute fracture or dislocation. Electronically Signed   By: Abelardo Diesel M.D.   On: 09/24/2015 14:36    Microbiology: Recent Results (from the past 240 hour(s))  C difficile quick scan w PCR reflex     Status:  None   Collection Time: 09/29/15  5:31 PM  Result Value Ref Range Status   C Diff antigen NEGATIVE NEGATIVE Final   C Diff toxin NEGATIVE NEGATIVE Final   C Diff interpretation Negative for toxigenic C. difficile  Final  Gastrointestinal Panel by PCR , Stool     Status: None   Collection Time: 09/29/15  5:31 PM  Result Value Ref Range Status   Campylobacter species NOT DETECTED NOT DETECTED Final   Plesimonas shigelloides NOT DETECTED NOT DETECTED Final   Salmonella species NOT DETECTED NOT DETECTED Final   Yersinia enterocolitica NOT DETECTED NOT DETECTED Final   Vibrio species NOT DETECTED NOT DETECTED Final   Vibrio cholerae NOT DETECTED NOT DETECTED Final   Enteroaggregative E coli (EAEC) NOT DETECTED NOT DETECTED Final   Enteropathogenic E coli (EPEC) NOT DETECTED NOT DETECTED Final   Enterotoxigenic E coli (ETEC) NOT DETECTED NOT DETECTED Final   Shiga like toxin producing E coli (STEC) NOT DETECTED NOT DETECTED Final   E. coli O157 NOT DETECTED NOT DETECTED Final   Shigella/Enteroinvasive E coli (EIEC) NOT DETECTED NOT DETECTED Final   Cryptosporidium NOT DETECTED NOT DETECTED Final   Cyclospora cayetanensis NOT DETECTED NOT DETECTED Final   Entamoeba histolytica NOT DETECTED NOT DETECTED Final   Giardia lamblia NOT DETECTED NOT DETECTED Final   Adenovirus F40/41 NOT DETECTED NOT DETECTED Final   Astrovirus NOT DETECTED NOT DETECTED Final   Norovirus GI/GII NOT DETECTED NOT DETECTED Final   Rotavirus A NOT DETECTED NOT DETECTED Final   Sapovirus (I, II, IV, and V) NOT DETECTED NOT DETECTED Final     Labs: Basic Metabolic Panel:  Recent Labs Lab 10/03/15 0403  NA 137  K 4.1  CL 106  CO2 24  GLUCOSE 103*  BUN 6  CREATININE 0.66  CALCIUM 8.5*   Liver Function Tests: No results for input(s): AST, ALT, ALKPHOS, BILITOT, PROT, ALBUMIN in the last 168 hours. No results for input(s): LIPASE, AMYLASE in the last 168 hours. No results for input(s): AMMONIA in the last  168 hours. CBC:  Recent Labs Lab 10/03/15 0403 10/03/15 0754 10/04/15 0412  WBC 17.2* 14.0* 13.3*  NEUTROABS  --  10.5*  --   HGB 11.5* 11.2* 11.6*  HCT 33.0* 32.7* 33.4*  MCV 98.5 97.3 97.1  PLT 271 258 275   Cardiac Enzymes: No results for input(s): CKTOTAL, CKMB,  CKMBINDEX, TROPONINI in the last 168 hours. BNP: BNP (last 3 results) No results for input(s): BNP in the last 8760 hours.  ProBNP (last 3 results) No results for input(s): PROBNP in the last 8760 hours.  CBG: No results for input(s): GLUCAP in the last 168 hours.     SignedDebbe Odea, MD [facility] 10/07/2015, 6:29 PM

## 2015-10-15 DIAGNOSIS — H401132 Primary open-angle glaucoma, bilateral, moderate stage: Secondary | ICD-10-CM | POA: Diagnosis not present

## 2015-10-17 DIAGNOSIS — K559 Vascular disorder of intestine, unspecified: Secondary | ICD-10-CM | POA: Diagnosis not present

## 2015-10-17 LAB — HEPATIC FUNCTION PANEL
ALK PHOS: 62 U/L (ref 25–125)
ALT: 18 U/L (ref 7–35)
AST: 15 U/L (ref 13–35)

## 2015-10-17 LAB — BASIC METABOLIC PANEL
BUN: 16 mg/dL (ref 4–21)
Creatinine: 0.8 mg/dL (ref <=1.1)
Glucose: 84 mg/dL

## 2015-10-17 LAB — CBC AND DIFFERENTIAL
HEMATOCRIT: 37 % (ref 36–46)
HEMOGLOBIN: 12.4 g/dL (ref 12.0–16.0)
Neutrophils Absolute: 5 /uL
Platelets: 411 10*3/uL — AB (ref 150–399)
WBC: 7 10^3/mL

## 2015-10-23 ENCOUNTER — Telehealth: Payer: Self-pay

## 2015-10-23 NOTE — Telephone Encounter (Signed)
SHAW - Pt saw you on May 24 for an injury and now needs a form completed for her insurance company.  I have put this in your box.  Please call her at [phone] or [phone] when form is complete.

## 2015-10-27 NOTE — Telephone Encounter (Signed)
Patient is calling to see if the form is ready. Patient states she leaves out of town today at 2 pm. Please call when ready for pick up! [phone]

## 2015-10-31 NOTE — Telephone Encounter (Signed)
Patient is calling because she hasn't heard anything and she needs the form now. Please call when ready for pick up!

## 2015-11-01 ENCOUNTER — Other Ambulatory Visit: Payer: Self-pay | Admitting: Internal Medicine

## 2015-11-06 DIAGNOSIS — D1801 Hemangioma of skin and subcutaneous tissue: Secondary | ICD-10-CM | POA: Diagnosis not present

## 2015-11-06 DIAGNOSIS — L821 Other seborrheic keratosis: Secondary | ICD-10-CM | POA: Diagnosis not present

## 2015-11-06 DIAGNOSIS — D2272 Melanocytic nevi of left lower limb, including hip: Secondary | ICD-10-CM | POA: Diagnosis not present

## 2015-11-06 DIAGNOSIS — L814 Other melanin hyperpigmentation: Secondary | ICD-10-CM | POA: Diagnosis not present

## 2015-11-06 DIAGNOSIS — L304 Erythema intertrigo: Secondary | ICD-10-CM | POA: Diagnosis not present

## 2015-11-06 DIAGNOSIS — L918 Other hypertrophic disorders of the skin: Secondary | ICD-10-CM | POA: Diagnosis not present

## 2015-11-14 ENCOUNTER — Encounter: Payer: Self-pay | Admitting: Internal Medicine

## 2015-11-17 DIAGNOSIS — Z5309 Procedure and treatment not carried out because of other contraindication: Secondary | ICD-10-CM | POA: Diagnosis not present

## 2015-11-17 DIAGNOSIS — Z800 Family history of malignant neoplasm of digestive organs: Secondary | ICD-10-CM | POA: Diagnosis not present

## 2015-11-17 DIAGNOSIS — K559 Vascular disorder of intestine, unspecified: Secondary | ICD-10-CM | POA: Diagnosis not present

## 2015-11-24 DIAGNOSIS — D122 Benign neoplasm of ascending colon: Secondary | ICD-10-CM | POA: Diagnosis not present

## 2015-11-24 DIAGNOSIS — K559 Vascular disorder of intestine, unspecified: Secondary | ICD-10-CM | POA: Diagnosis not present

## 2015-11-24 DIAGNOSIS — K648 Other hemorrhoids: Secondary | ICD-10-CM | POA: Diagnosis not present

## 2015-11-24 DIAGNOSIS — Z800 Family history of malignant neoplasm of digestive organs: Secondary | ICD-10-CM | POA: Diagnosis not present

## 2015-11-24 DIAGNOSIS — Z1211 Encounter for screening for malignant neoplasm of colon: Secondary | ICD-10-CM | POA: Diagnosis not present

## 2015-11-24 DIAGNOSIS — K5730 Diverticulosis of large intestine without perforation or abscess without bleeding: Secondary | ICD-10-CM | POA: Diagnosis not present

## 2015-11-24 DIAGNOSIS — K641 Second degree hemorrhoids: Secondary | ICD-10-CM | POA: Diagnosis not present

## 2015-11-24 DIAGNOSIS — D126 Benign neoplasm of colon, unspecified: Secondary | ICD-10-CM | POA: Diagnosis not present

## 2015-12-01 ENCOUNTER — Encounter: Payer: Self-pay | Admitting: Internal Medicine

## 2016-02-17 DIAGNOSIS — H401122 Primary open-angle glaucoma, left eye, moderate stage: Secondary | ICD-10-CM | POA: Diagnosis not present

## 2016-02-17 DIAGNOSIS — H401112 Primary open-angle glaucoma, right eye, moderate stage: Secondary | ICD-10-CM | POA: Diagnosis not present

## 2016-02-19 ENCOUNTER — Other Ambulatory Visit: Payer: Self-pay | Admitting: Internal Medicine

## 2016-03-01 DIAGNOSIS — M7062 Trochanteric bursitis, left hip: Secondary | ICD-10-CM | POA: Diagnosis not present

## 2016-03-13 LAB — GLUCOSE, POCT (MANUAL RESULT ENTRY): POC Glucose: 91 mg/dl (ref 70–99)

## 2016-04-15 ENCOUNTER — Other Ambulatory Visit: Payer: Self-pay | Admitting: Internal Medicine

## 2016-05-06 DIAGNOSIS — M1711 Unilateral primary osteoarthritis, right knee: Secondary | ICD-10-CM | POA: Diagnosis not present

## 2016-05-11 ENCOUNTER — Other Ambulatory Visit (INDEPENDENT_AMBULATORY_CARE_PROVIDER_SITE_OTHER): Payer: PPO

## 2016-05-11 DIAGNOSIS — R7989 Other specified abnormal findings of blood chemistry: Secondary | ICD-10-CM

## 2016-05-11 DIAGNOSIS — Z0000 Encounter for general adult medical examination without abnormal findings: Secondary | ICD-10-CM

## 2016-05-11 LAB — POC URINALSYSI DIPSTICK (AUTOMATED)
Bilirubin, UA: NEGATIVE
GLUCOSE UA: NEGATIVE
KETONES UA: NEGATIVE
Leukocytes, UA: NEGATIVE
Nitrite, UA: NEGATIVE
RBC UA: NEGATIVE
SPEC GRAV UA: 1.015
UROBILINOGEN UA: 0.2
pH, UA: 7.5

## 2016-05-11 LAB — CBC WITH DIFFERENTIAL/PLATELET
BASOS ABS: 0 10*3/uL (ref 0.0–0.1)
Basophils Relative: 0.9 % (ref 0.0–3.0)
EOS ABS: 0.1 10*3/uL (ref 0.0–0.7)
Eosinophils Relative: 2.3 % (ref 0.0–5.0)
HCT: 39.4 % (ref 36.0–46.0)
HEMOGLOBIN: 13.6 g/dL (ref 12.0–15.0)
Lymphocytes Relative: 30.5 % (ref 12.0–46.0)
Lymphs Abs: 1.7 10*3/uL (ref 0.7–4.0)
MCHC: 34.4 g/dL (ref 30.0–36.0)
MCV: 99.4 fl (ref 78.0–100.0)
MONO ABS: 0.5 10*3/uL (ref 0.1–1.0)
Monocytes Relative: 8.7 % (ref 3.0–12.0)
NEUTROS PCT: 57.6 % (ref 43.0–77.0)
Neutro Abs: 3.2 10*3/uL (ref 1.4–7.7)
Platelets: 266 10*3/uL (ref 150.0–400.0)
RBC: 3.96 Mil/uL (ref 3.87–5.11)
RDW: 13 % (ref 11.5–15.5)
WBC: 5.5 10*3/uL (ref 4.0–10.5)

## 2016-05-11 LAB — HEPATIC FUNCTION PANEL
ALT: 22 U/L (ref 0–35)
AST: 22 U/L (ref 0–37)
Albumin: 4.2 g/dL (ref 3.5–5.2)
Alkaline Phosphatase: 48 U/L (ref 39–117)
BILIRUBIN DIRECT: 0.1 mg/dL (ref 0.0–0.3)
BILIRUBIN TOTAL: 0.4 mg/dL (ref 0.2–1.2)
Total Protein: 6.7 g/dL (ref 6.0–8.3)

## 2016-05-11 LAB — LDL CHOLESTEROL, DIRECT: LDL DIRECT: 237 mg/dL

## 2016-05-11 LAB — BASIC METABOLIC PANEL
BUN: 15 mg/dL (ref 6–23)
CALCIUM: 9.8 mg/dL (ref 8.4–10.5)
CO2: 31 mEq/L (ref 19–32)
CREATININE: 0.85 mg/dL (ref 0.40–1.20)
Chloride: 100 mEq/L (ref 96–112)
GFR: 70.66 mL/min (ref >60.00)
Glucose, Bld: 112 mg/dL — ABNORMAL HIGH (ref 70–99)
POTASSIUM: 4 meq/L (ref 3.5–5.1)
Sodium: 140 mEq/L (ref 135–145)

## 2016-05-11 LAB — LIPID PANEL
CHOL/HDL RATIO: 6
Cholesterol: 357 mg/dL — ABNORMAL HIGH (ref 0–200)
HDL: 61.8 mg/dL (ref >39.00)
NONHDL: 295.54
Triglycerides: 230 mg/dL — ABNORMAL HIGH (ref 0.0–149.0)
VLDL: 46 mg/dL — ABNORMAL HIGH (ref 0.0–40.0)

## 2016-05-11 LAB — TSH: TSH: 3.88 u[IU]/mL (ref 0.35–4.50)

## 2016-05-13 DIAGNOSIS — M1711 Unilateral primary osteoarthritis, right knee: Secondary | ICD-10-CM | POA: Diagnosis not present

## 2016-05-18 ENCOUNTER — Ambulatory Visit (INDEPENDENT_AMBULATORY_CARE_PROVIDER_SITE_OTHER): Payer: PPO | Admitting: Internal Medicine

## 2016-05-18 ENCOUNTER — Encounter: Payer: Self-pay | Admitting: Internal Medicine

## 2016-05-18 VITALS — BP 130/72 | Wt 234.0 lb

## 2016-05-18 DIAGNOSIS — I421 Obstructive hypertrophic cardiomyopathy: Secondary | ICD-10-CM

## 2016-05-18 DIAGNOSIS — I10 Essential (primary) hypertension: Secondary | ICD-10-CM

## 2016-05-18 DIAGNOSIS — E785 Hyperlipidemia, unspecified: Secondary | ICD-10-CM

## 2016-05-18 DIAGNOSIS — E7800 Pure hypercholesterolemia, unspecified: Secondary | ICD-10-CM | POA: Diagnosis not present

## 2016-05-18 NOTE — Progress Notes (Signed)
Pre visit review using our clinic review tool, if applicable. No additional management support is needed unless otherwise documented below in the visit note. 

## 2016-05-18 NOTE — Patient Instructions (Addendum)
Resume Crestor.  3 times weekly on Monday, Wednesday and Friday    It is important that you exercise regularly, at least 20 minutes 3 to 4 times per week.  If you develop chest pain or shortness of breath seek  medical attention.  You need to lose weight.  Consider a lower calorie diet and regular exercise.  Return in 3 months for follow-up  Please check your blood pressure on a regular basis.  If it is consistently greater than 150/90, please make an office appointment.  Limit your sodium (Salt) intake

## 2016-05-18 NOTE — Progress Notes (Signed)
Subjective:    Patient ID: Susan Jenkins, female    DOB: June 01, 1945, 71 y.o.   MRN: [id number]  HPI  71 year old patient who is seen today for a preventive health examination Medical problems include dyslipidemia.  She has been on simvastatin and atorvastatin.  More recently Crestor.  She has been off statin therapy for about 10 months after developing muscle pain in the upper arm areas.  She has considerable orthopedic history.  Recent lipid file reviewed and revealed a total cholesterol in excess of 300. Colonoscopy performed July 2017  Past Medical History:  Diagnosis Date  . Depression   . Glaucoma   . Hemorrhoids   . HOCM (hypertrophic obstructive cardiomyopathy) ([place])   . Hyperlipidemia   . Hypertension   . Pulmonary embolus ([place]) 1971   following surgery  . Vitamin D deficiency      Social History   Social History  . Marital status: Single    Spouse name: N/A  . Number of children: N/A  . Years of education: N/A   Occupational History  . Not on file.   Social History Main Topics  . Smoking status: Former Smoker    Types: Cigarettes    Quit date: 05/03/1980  . Smokeless tobacco: Not on file  . Alcohol use No  . Drug use: No  . Sexual activity: No   Other Topics Concern  . Not on file   Social History Narrative  . No narrative on file    Past Surgical History:  Procedure Laterality Date  . ANKLE SURGERY    . KNEE SURGERY      Family History  Problem Relation Age of Onset  . Dementia Mother   . Cancer Father   . Heart failure Father     Allergies  Allergen Reactions  . Amoxicillin-Pot Clavulanate Anaphylaxis    Has patient had a PCN reaction causing immediate rash, facial/tongue/throat swelling, SOB or lightheadedness with hypotension: Yes Has patient had a PCN reaction causing severe rash involving mucus membranes or skin necrosis: Yes Has patient had a PCN reaction that required hospitalization Yes Has patient had a PCN reaction occurring  within the last 10 years: No If all of the above answers are "NO", then may proceed with Cephalosporin use.    Current Outpatient Prescriptions on File Prior to Visit  Medication Sig Dispense Refill  . amLODipine (NORVASC) 10 MG tablet TAKE 1 TABLET BY MOUTH EVERY DAY 90 tablet 1  . aspirin 81 MG tablet Take 81 mg by mouth daily.    . Bismuth Subsalicylate (KAOPECTATE PO) Take 10 mLs by mouth every 6 (six) hours as needed (for nausea).    . Calcium Carbonate-Vit D-Min 600-400 MG-UNIT TABS Take 1 tablet by mouth daily.    Marland Kitchen docusate sodium (COLACE) 100 MG capsule Take 100 mg by mouth 2 (two) times daily as needed for constipation.    . Garlic (GARLIQUE) [id number] MG TBEC Take 1 tablet by mouth at bedtime.    . hydrochlorothiazide (HYDRODIURIL) 25 MG tablet Take 1 tablet (25 mg total) by mouth daily. 90 tablet 3  . latanoprost (XALATAN) 0.005 % ophthalmic solution Place 1 drop into both eyes at bedtime.    Marland Kitchen loratadine (CLARITIN) 10 MG tablet Take 10 mg by mouth daily.    . Melatonin 1 MG TABS Take 3 tablets by mouth at bedtime.     . Misc Natural Products (OSTEO BI-FLEX TRIPLE STRENGTH PO) Take 1 tablet by mouth 2 (two) times daily.    Marland Kitchen  Multiple Vitamin (MULTIVITAMIN) capsule Take 1 capsule by mouth daily.    . Omega-3 Fatty Acids (FISH OIL PO) Take 1 capsule by mouth daily.    Marland Kitchen pyridOXINE (B-6) 50 MG tablet Take 50 mg by mouth daily.    . rosuvastatin (CRESTOR) 10 MG tablet TAKE 1 TABLET BY MOUTH EVERY DAY 90 tablet 1  . saccharomyces boulardii (FLORASTOR) 250 MG capsule Take 1 capsule (250 mg total) by mouth 2 (two) times daily. 14 capsule 0  . timolol (TIMOPTIC) 0.5 % ophthalmic solution Place 1 drop into both eyes 2 (two) times daily.     Marland Kitchen venlafaxine XR (EFFEXOR-XR) 75 MG 24 hr capsule TAKE ONE CAPSULE BY MOUTH EVERY DAY 90 capsule 3   No current facility-administered medications on file prior to visit.     BP 130/72   Wt 234 lb (106.1 kg)   BMI 36.65 kg/m   Medicare wellness  visit  1. Risk factors, based on past  M,S,F history.  Cardiovascular risk factors include a history of hypertension and dyslipidemia.  She has a history of hypertrophic cardiomyopathy  2.  Physical activities:active with walking.  In water aerobics  3.  Depression/mood:no history of major depression or mood disorder  4.  Hearing:no deficits  5.  ADL's:independent  6.  Fall risk:low  7.  Home safety:no problems identified  8.  Height weight, and visual acuity;height and weight stable no change in visual acuity  9.  Counseling:heart healthy diet modest weight loss and more rigorous exercise encouraged  10. Lab orders based on risk factors:laboratory profile reviewed with special attention to the lipid profile  11. Referral :not appropriate at this time  12. Care plan:continue efforts at aggressive risk factor modification.  Will rechallenge with Crestor.  3 times weekly  13. Cognitive assessment: alert and oriented with normal affect no cognitive dysfunction  14. Screening: Patient provided with a written and personalized 5-10 year screening schedule in the AVS.    15. Provider List Update: primary care.  GI and radiology   Review of Systems  Constitutional: Negative.   HENT: Negative for congestion, dental problem, hearing loss, rhinorrhea, sinus pressure, sore throat and tinnitus.   Eyes: Negative for pain, discharge and visual disturbance.  Respiratory: Negative for cough and shortness of breath.   Cardiovascular: Negative for chest pain, palpitations and leg swelling.  Gastrointestinal: Negative for abdominal distention, abdominal pain, blood in stool, constipation, diarrhea, nausea and vomiting.  Genitourinary: Negative for difficulty urinating, dysuria, flank pain, frequency, hematuria, pelvic pain, urgency, vaginal bleeding, vaginal discharge and vaginal pain.  Musculoskeletal: Positive for arthralgias, back pain and myalgias. Negative for gait problem and joint  swelling.  Skin: Negative for rash.  Neurological: Negative for dizziness, syncope, speech difficulty, weakness, numbness and headaches.  Hematological: Negative for adenopathy.  Psychiatric/Behavioral: Negative for agitation, behavioral problems and dysphoric mood. The patient is not nervous/anxious.        Objective:   Physical Exam  Constitutional: She is oriented to person, place, and time. She appears well-developed and well-nourished.  Weight 234  HENT:  Head: Normocephalic and atraumatic.  Right Ear: External ear normal.  Left Ear: External ear normal.  Mouth/Throat: Oropharynx is clear and moist.  Eyes: Conjunctivae and EOM are normal.  Neck: Normal range of motion. Neck supple. No JVD present. No thyromegaly present.  Cardiovascular: Normal rate, regular rhythm, normal heart sounds and intact distal pulses.   No murmur heard. Pulmonary/Chest: Effort normal and breath sounds normal. She has no wheezes.  She has no rales.  Abdominal: Soft. Bowel sounds are normal. She exhibits no distension and no mass. There is no tenderness. There is no rebound and no guarding.  Genitourinary: Vagina normal.  Musculoskeletal: Normal range of motion. She exhibits no edema or tenderness.  Surgical scars right knee and left medial ankle  Neurological: She is alert and oriented to person, place, and time. She has normal reflexes. No cranial nerve deficit. She exhibits normal muscle tone. Coordination normal.  Skin: Skin is warm and dry. No rash noted.  Psychiatric: She has a normal mood and affect. Her behavior is normal.          Assessment & Plan:   Preventive health examination Dyslipidemia.  Will rechallenge with Crestor 10 mg Monday, Wednesday, Friday 3 times per week.  Will recheck in 3 months and follow-up lipid profile at that time   Essential hypertension, stable  obesity.  Weight loss encouraged History of hypertrophic cardiomyopathy.  Asymptomatic    Osteoarthritis.   Follow-up 3 months  Nyoka Cowden

## 2016-05-21 DIAGNOSIS — M1711 Unilateral primary osteoarthritis, right knee: Secondary | ICD-10-CM | POA: Diagnosis not present

## 2016-05-31 DIAGNOSIS — M9903 Segmental and somatic dysfunction of lumbar region: Secondary | ICD-10-CM | POA: Diagnosis not present

## 2016-05-31 DIAGNOSIS — M9901 Segmental and somatic dysfunction of cervical region: Secondary | ICD-10-CM | POA: Diagnosis not present

## 2016-05-31 DIAGNOSIS — M545 Low back pain: Secondary | ICD-10-CM | POA: Diagnosis not present

## 2016-05-31 DIAGNOSIS — M542 Cervicalgia: Secondary | ICD-10-CM | POA: Diagnosis not present

## 2016-06-01 DIAGNOSIS — Z803 Family history of malignant neoplasm of breast: Secondary | ICD-10-CM | POA: Diagnosis not present

## 2016-06-01 DIAGNOSIS — M545 Low back pain: Secondary | ICD-10-CM | POA: Diagnosis not present

## 2016-06-01 DIAGNOSIS — M9901 Segmental and somatic dysfunction of cervical region: Secondary | ICD-10-CM | POA: Diagnosis not present

## 2016-06-01 DIAGNOSIS — M9903 Segmental and somatic dysfunction of lumbar region: Secondary | ICD-10-CM | POA: Diagnosis not present

## 2016-06-01 DIAGNOSIS — Z1231 Encounter for screening mammogram for malignant neoplasm of breast: Secondary | ICD-10-CM | POA: Diagnosis not present

## 2016-06-01 DIAGNOSIS — M542 Cervicalgia: Secondary | ICD-10-CM | POA: Diagnosis not present

## 2016-06-01 LAB — HM MAMMOGRAPHY

## 2016-06-03 DIAGNOSIS — M9903 Segmental and somatic dysfunction of lumbar region: Secondary | ICD-10-CM | POA: Diagnosis not present

## 2016-06-03 DIAGNOSIS — M545 Low back pain: Secondary | ICD-10-CM | POA: Diagnosis not present

## 2016-06-03 DIAGNOSIS — M542 Cervicalgia: Secondary | ICD-10-CM | POA: Diagnosis not present

## 2016-06-03 DIAGNOSIS — M9901 Segmental and somatic dysfunction of cervical region: Secondary | ICD-10-CM | POA: Diagnosis not present

## 2016-06-08 DIAGNOSIS — H401122 Primary open-angle glaucoma, left eye, moderate stage: Secondary | ICD-10-CM | POA: Diagnosis not present

## 2016-06-08 DIAGNOSIS — H401111 Primary open-angle glaucoma, right eye, mild stage: Secondary | ICD-10-CM | POA: Diagnosis not present

## 2016-06-08 DIAGNOSIS — M545 Low back pain: Secondary | ICD-10-CM | POA: Diagnosis not present

## 2016-06-08 DIAGNOSIS — M9903 Segmental and somatic dysfunction of lumbar region: Secondary | ICD-10-CM | POA: Diagnosis not present

## 2016-06-08 DIAGNOSIS — M542 Cervicalgia: Secondary | ICD-10-CM | POA: Diagnosis not present

## 2016-06-08 DIAGNOSIS — M9901 Segmental and somatic dysfunction of cervical region: Secondary | ICD-10-CM | POA: Diagnosis not present

## 2016-06-09 DIAGNOSIS — M9901 Segmental and somatic dysfunction of cervical region: Secondary | ICD-10-CM | POA: Diagnosis not present

## 2016-06-09 DIAGNOSIS — M545 Low back pain: Secondary | ICD-10-CM | POA: Diagnosis not present

## 2016-06-09 DIAGNOSIS — M542 Cervicalgia: Secondary | ICD-10-CM | POA: Diagnosis not present

## 2016-06-09 DIAGNOSIS — M9903 Segmental and somatic dysfunction of lumbar region: Secondary | ICD-10-CM | POA: Diagnosis not present

## 2016-06-10 ENCOUNTER — Encounter: Payer: Self-pay | Admitting: Internal Medicine

## 2016-06-14 DIAGNOSIS — M545 Low back pain: Secondary | ICD-10-CM | POA: Diagnosis not present

## 2016-06-14 DIAGNOSIS — M9901 Segmental and somatic dysfunction of cervical region: Secondary | ICD-10-CM | POA: Diagnosis not present

## 2016-06-14 DIAGNOSIS — M9903 Segmental and somatic dysfunction of lumbar region: Secondary | ICD-10-CM | POA: Diagnosis not present

## 2016-06-14 DIAGNOSIS — M542 Cervicalgia: Secondary | ICD-10-CM | POA: Diagnosis not present

## 2016-06-16 DIAGNOSIS — M9903 Segmental and somatic dysfunction of lumbar region: Secondary | ICD-10-CM | POA: Diagnosis not present

## 2016-06-16 DIAGNOSIS — M9901 Segmental and somatic dysfunction of cervical region: Secondary | ICD-10-CM | POA: Diagnosis not present

## 2016-06-16 DIAGNOSIS — M542 Cervicalgia: Secondary | ICD-10-CM | POA: Diagnosis not present

## 2016-06-16 DIAGNOSIS — M545 Low back pain: Secondary | ICD-10-CM | POA: Diagnosis not present

## 2016-06-17 DIAGNOSIS — M542 Cervicalgia: Secondary | ICD-10-CM | POA: Diagnosis not present

## 2016-06-17 DIAGNOSIS — M9901 Segmental and somatic dysfunction of cervical region: Secondary | ICD-10-CM | POA: Diagnosis not present

## 2016-06-17 DIAGNOSIS — M545 Low back pain: Secondary | ICD-10-CM | POA: Diagnosis not present

## 2016-06-17 DIAGNOSIS — M9903 Segmental and somatic dysfunction of lumbar region: Secondary | ICD-10-CM | POA: Diagnosis not present

## 2016-06-25 ENCOUNTER — Other Ambulatory Visit: Payer: Self-pay | Admitting: Internal Medicine

## 2016-07-28 DIAGNOSIS — M9901 Segmental and somatic dysfunction of cervical region: Secondary | ICD-10-CM | POA: Diagnosis not present

## 2016-07-28 DIAGNOSIS — M9903 Segmental and somatic dysfunction of lumbar region: Secondary | ICD-10-CM | POA: Diagnosis not present

## 2016-07-28 DIAGNOSIS — M542 Cervicalgia: Secondary | ICD-10-CM | POA: Diagnosis not present

## 2016-07-28 DIAGNOSIS — M545 Low back pain: Secondary | ICD-10-CM | POA: Diagnosis not present

## 2016-08-10 ENCOUNTER — Ambulatory Visit (INDEPENDENT_AMBULATORY_CARE_PROVIDER_SITE_OTHER): Payer: PPO | Admitting: Internal Medicine

## 2016-08-10 ENCOUNTER — Encounter: Payer: Self-pay | Admitting: Internal Medicine

## 2016-08-10 VITALS — BP 140/72 | HR 56 | Temp 98.0°F | Ht 67.0 in | Wt 229.4 lb

## 2016-08-10 DIAGNOSIS — I10 Essential (primary) hypertension: Secondary | ICD-10-CM

## 2016-08-10 DIAGNOSIS — E7800 Pure hypercholesterolemia, unspecified: Secondary | ICD-10-CM

## 2016-08-10 LAB — LIPID PANEL
CHOLESTEROL: 194 mg/dL (ref 0–200)
HDL: 65.5 mg/dL (ref >39.00)
LDL Cholesterol: 94 mg/dL (ref 0–99)
NONHDL: 128.11
Total CHOL/HDL Ratio: 3
Triglycerides: 170 mg/dL — ABNORMAL HIGH (ref 0.0–149.0)
VLDL: 34 mg/dL (ref 0.0–40.0)

## 2016-08-10 NOTE — Progress Notes (Signed)
Subjective:    Patient ID: Susan Jenkins, female    DOB: 08-06-1945, 71 y.o.   MRN: [id number]  HPI  71 year old patient who is seen today for follow-up.  She has hypertension and dyslipidemia. She has a history of statin intolerance.  3 months ago.  She was given a trial of Crestor 10 mg 3 times weekly.  She has tolerated this well and 2 weeks ago.  She started daily.  Use.  She continues to tolerate No new concerns or complaints  Past Medical History:  Diagnosis Date  . Depression   . Glaucoma   . Hemorrhoids   . HOCM (hypertrophic obstructive cardiomyopathy) ([place])   . Hyperlipidemia   . Hypertension   . Pulmonary embolus ([place]) 1971   following surgery  . Vitamin D deficiency      Social History   Social History  . Marital status: Single    Spouse name: N/A  . Number of children: N/A  . Years of education: N/A   Occupational History  . Not on file.   Social History Main Topics  . Smoking status: Former Smoker    Types: Cigarettes    Quit date: 05/03/1980  . Smokeless tobacco: Never Used  . Alcohol use No  . Drug use: No  . Sexual activity: No   Other Topics Concern  . Not on file   Social History Narrative  . No narrative on file    Past Surgical History:  Procedure Laterality Date  . ANKLE SURGERY    . KNEE SURGERY      Family History  Problem Relation Age of Onset  . Dementia Mother   . Cancer Father   . Heart failure Father     Allergies  Allergen Reactions  . Amoxicillin-Pot Clavulanate Anaphylaxis    Has patient had a PCN reaction causing immediate rash, facial/tongue/throat swelling, SOB or lightheadedness with hypotension: Yes Has patient had a PCN reaction causing severe rash involving mucus membranes or skin necrosis: Yes Has patient had a PCN reaction that required hospitalization Yes Has patient had a PCN reaction occurring within the last 10 years: No If all of the above answers are "NO", then may proceed with Cephalosporin use.     Current Outpatient Prescriptions on File Prior to Visit  Medication Sig Dispense Refill  . amLODipine (NORVASC) 10 MG tablet TAKE 1 TABLET BY MOUTH EVERY DAY 90 tablet 1  . aspirin 81 MG tablet Take 81 mg by mouth daily.    . Bismuth Subsalicylate (KAOPECTATE PO) Take 10 mLs by mouth every 6 (six) hours as needed (for nausea).    . Calcium Carbonate-Vit D-Min 600-400 MG-UNIT TABS Take 1 tablet by mouth daily.    Marland Kitchen docusate sodium (COLACE) 100 MG capsule Take 100 mg by mouth 2 (two) times daily as needed for constipation.    Marland Kitchen glucosamine-chondroitin 500-400 MG tablet Take 1 tablet by mouth daily.    . hydrochlorothiazide (HYDRODIURIL) 25 MG tablet TAKE 1 TABLET BY MOUTH EVERY DAY 90 tablet 0  . ibuprofen (ADVIL,MOTRIN) 200 MG tablet Take 200 mg by mouth 2 (two) times daily.    Marland Kitchen lactobacillus acidophilus (BACID) TABS tablet Take 1 tablet by mouth at bedtime.    Marland Kitchen latanoprost (XALATAN) 0.005 % ophthalmic solution Place 1 drop into both eyes at bedtime.    Marland Kitchen loratadine (CLARITIN) 10 MG tablet Take 10 mg by mouth daily.    . Melatonin 1 MG TABS Take 3 tablets by mouth at bedtime.     Marland Kitchen  Misc Natural Products (OSTEO BI-FLEX TRIPLE STRENGTH PO) Take 1 tablet by mouth 2 (two) times daily.    . Multiple Vitamin (MULTIVITAMIN) capsule Take 1 capsule by mouth daily.    . Omega-3 Fatty Acids (FISH OIL PO) Take 1 capsule by mouth daily.    Marland Kitchen pyridOXINE (B-6) 50 MG tablet Take 50 mg by mouth daily.    . rosuvastatin (CRESTOR) 10 MG tablet TAKE 1 TABLET BY MOUTH EVERY DAY 90 tablet 1  . saccharomyces boulardii (FLORASTOR) 250 MG capsule Take 1 capsule (250 mg total) by mouth 2 (two) times daily. 14 capsule 0  . timolol (TIMOPTIC) 0.5 % ophthalmic solution Place 1 drop into both eyes 2 (two) times daily.     Marland Kitchen venlafaxine XR (EFFEXOR-XR) 75 MG 24 hr capsule TAKE ONE CAPSULE BY MOUTH EVERY DAY 90 capsule 3   No current facility-administered medications on file prior to visit.     BP 140/72 (BP  Location: Left Arm, Patient Position: Sitting, Cuff Size: Normal)   Pulse (!) 56   Temp 98 F (36.7 C) (Oral)   Ht 5\' 7"  (1.702 m)   Wt 229 lb 6.4 oz (104.1 kg)   SpO2 96%   BMI 35.93 kg/m     Review of Systems  Constitutional: Negative.   HENT: Negative for congestion, dental problem, hearing loss, rhinorrhea, sinus pressure, sore throat and tinnitus.   Eyes: Negative for pain, discharge and visual disturbance.  Respiratory: Negative for cough and shortness of breath.   Cardiovascular: Negative for chest pain, palpitations and leg swelling.  Gastrointestinal: Negative for abdominal distention, abdominal pain, blood in stool, constipation, diarrhea, nausea and vomiting.  Genitourinary: Negative for difficulty urinating, dysuria, flank pain, frequency, hematuria, pelvic pain, urgency, vaginal bleeding, vaginal discharge and vaginal pain.  Musculoskeletal: Negative for arthralgias, gait problem and joint swelling.  Skin: Negative for rash.  Neurological: Negative for dizziness, syncope, speech difficulty, weakness, numbness and headaches.  Hematological: Negative for adenopathy.  Psychiatric/Behavioral: Negative for agitation, behavioral problems and dysphoric mood. The patient is not nervous/anxious.        Objective:   Physical Exam  Constitutional: She is oriented to person, place, and time. She appears well-developed and well-nourished.  Blood pressure 140/70  HENT:  Head: Normocephalic.  Right Ear: External ear normal.  Left Ear: External ear normal.  Mouth/Throat: Oropharynx is clear and moist.  Eyes: Conjunctivae and EOM are normal. Pupils are equal, round, and reactive to light.  Neck: Normal range of motion. Neck supple. No thyromegaly present.  Cardiovascular: Normal rate, regular rhythm, normal heart sounds and intact distal pulses.   Grade 2/6 systolic murmur  Pulmonary/Chest: Effort normal and breath sounds normal.  Abdominal: Soft. Bowel sounds are normal. She  exhibits no mass. There is no tenderness.  Musculoskeletal: Normal range of motion.  Lymphadenopathy:    She has no cervical adenopathy.  Neurological: She is alert and oriented to person, place, and time.  Skin: Skin is warm and dry. No rash noted.  Psychiatric: She has a normal mood and affect. Her behavior is normal.          Assessment & Plan:   Essential hypertension, stable.  No change in medical regimen Dyslipidemia.  We'll review a lipid profile Asymptomatic hypertrophic cardiomyopathy  Follow-up 6 months  Susan Jenkins Pilar Plate

## 2016-08-10 NOTE — Patient Instructions (Signed)
Limit your sodium (Salt) intake    It is important that you exercise regularly, at least 20 minutes 3 to 4 times per week.  If you develop chest pain or shortness of breath seek  medical attention.  You need to lose weight.  Consider a lower calorie diet and regular exercise.  Return in 6 months for follow-up   

## 2016-08-10 NOTE — Progress Notes (Signed)
Pre visit review using our clinic review tool, if applicable. No additional management support is needed unless otherwise documented below in the visit note. 

## 2016-08-11 DIAGNOSIS — M542 Cervicalgia: Secondary | ICD-10-CM | POA: Diagnosis not present

## 2016-08-11 DIAGNOSIS — M9903 Segmental and somatic dysfunction of lumbar region: Secondary | ICD-10-CM | POA: Diagnosis not present

## 2016-08-11 DIAGNOSIS — M9901 Segmental and somatic dysfunction of cervical region: Secondary | ICD-10-CM | POA: Diagnosis not present

## 2016-08-11 DIAGNOSIS — M545 Low back pain: Secondary | ICD-10-CM | POA: Diagnosis not present

## 2016-08-16 ENCOUNTER — Telehealth: Payer: Self-pay | Admitting: Internal Medicine

## 2016-08-16 DIAGNOSIS — E2839 Other primary ovarian failure: Secondary | ICD-10-CM

## 2016-08-16 NOTE — Telephone Encounter (Signed)
Pt scheduled  

## 2016-08-16 NOTE — Telephone Encounter (Signed)
Okay to place order? °

## 2016-08-16 NOTE — Telephone Encounter (Signed)
Please schedule. Thank you

## 2016-08-16 NOTE — Telephone Encounter (Signed)
Pt would like to have a Bone Density done is it okay to schedule?

## 2016-08-16 NOTE — Telephone Encounter (Signed)
The order has been put in.   Can you help her schedule it? Thank you!

## 2016-08-23 DIAGNOSIS — M545 Low back pain: Secondary | ICD-10-CM | POA: Diagnosis not present

## 2016-08-23 DIAGNOSIS — M9903 Segmental and somatic dysfunction of lumbar region: Secondary | ICD-10-CM | POA: Diagnosis not present

## 2016-08-23 DIAGNOSIS — M542 Cervicalgia: Secondary | ICD-10-CM | POA: Diagnosis not present

## 2016-08-23 DIAGNOSIS — M9901 Segmental and somatic dysfunction of cervical region: Secondary | ICD-10-CM | POA: Diagnosis not present

## 2016-08-24 ENCOUNTER — Ambulatory Visit (INDEPENDENT_AMBULATORY_CARE_PROVIDER_SITE_OTHER)
Admission: RE | Admit: 2016-08-24 | Discharge: 2016-08-24 | Disposition: A | Discharge status: Home / Self Care | Payer: PPO | Source: Ambulatory Visit | Attending: Internal Medicine | Admitting: Internal Medicine

## 2016-08-24 DIAGNOSIS — E2839 Other primary ovarian failure: Secondary | ICD-10-CM | POA: Diagnosis not present

## 2016-09-14 DIAGNOSIS — M542 Cervicalgia: Secondary | ICD-10-CM | POA: Diagnosis not present

## 2016-09-14 DIAGNOSIS — M545 Low back pain: Secondary | ICD-10-CM | POA: Diagnosis not present

## 2016-09-14 DIAGNOSIS — M9903 Segmental and somatic dysfunction of lumbar region: Secondary | ICD-10-CM | POA: Diagnosis not present

## 2016-09-14 DIAGNOSIS — M9901 Segmental and somatic dysfunction of cervical region: Secondary | ICD-10-CM | POA: Diagnosis not present

## 2016-09-21 ENCOUNTER — Other Ambulatory Visit: Payer: Self-pay | Admitting: Internal Medicine

## 2016-09-21 MED ORDER — HYDROCHLOROTHIAZIDE 25 MG PO TABS: 25.0000 mg | ORAL_TABLET | Freq: Every day | ORAL | 2 refills | Status: DC

## 2016-09-22 DIAGNOSIS — Z803 Family history of malignant neoplasm of breast: Secondary | ICD-10-CM | POA: Diagnosis not present

## 2016-09-22 DIAGNOSIS — R102 Pelvic and perineal pain: Secondary | ICD-10-CM | POA: Diagnosis not present

## 2016-09-22 DIAGNOSIS — Z124 Encounter for screening for malignant neoplasm of cervix: Secondary | ICD-10-CM | POA: Diagnosis not present

## 2016-09-22 DIAGNOSIS — Z800 Family history of malignant neoplasm of digestive organs: Secondary | ICD-10-CM | POA: Diagnosis not present

## 2016-09-22 DIAGNOSIS — R1031 Right lower quadrant pain: Secondary | ICD-10-CM | POA: Diagnosis not present

## 2016-09-22 DIAGNOSIS — Z01419 Encounter for gynecological examination (general) (routine) without abnormal findings: Secondary | ICD-10-CM | POA: Diagnosis not present

## 2016-09-25 ENCOUNTER — Other Ambulatory Visit: Payer: Self-pay | Admitting: Internal Medicine

## 2016-10-09 ENCOUNTER — Other Ambulatory Visit: Payer: Self-pay | Admitting: Internal Medicine

## 2016-10-13 DIAGNOSIS — M9901 Segmental and somatic dysfunction of cervical region: Secondary | ICD-10-CM | POA: Diagnosis not present

## 2016-10-13 DIAGNOSIS — M545 Low back pain: Secondary | ICD-10-CM | POA: Diagnosis not present

## 2016-10-13 DIAGNOSIS — M542 Cervicalgia: Secondary | ICD-10-CM | POA: Diagnosis not present

## 2016-10-13 DIAGNOSIS — M9903 Segmental and somatic dysfunction of lumbar region: Secondary | ICD-10-CM | POA: Diagnosis not present

## 2016-11-11 DIAGNOSIS — D1801 Hemangioma of skin and subcutaneous tissue: Secondary | ICD-10-CM | POA: Diagnosis not present

## 2016-11-11 DIAGNOSIS — L814 Other melanin hyperpigmentation: Secondary | ICD-10-CM | POA: Diagnosis not present

## 2016-11-11 DIAGNOSIS — D220 Melanocytic nevi of lip: Secondary | ICD-10-CM | POA: Diagnosis not present

## 2016-11-11 DIAGNOSIS — L821 Other seborrheic keratosis: Secondary | ICD-10-CM | POA: Diagnosis not present

## 2016-11-11 DIAGNOSIS — B351 Tinea unguium: Secondary | ICD-10-CM | POA: Diagnosis not present

## 2016-11-17 DIAGNOSIS — M542 Cervicalgia: Secondary | ICD-10-CM | POA: Diagnosis not present

## 2016-11-17 DIAGNOSIS — M9901 Segmental and somatic dysfunction of cervical region: Secondary | ICD-10-CM | POA: Diagnosis not present

## 2016-11-17 DIAGNOSIS — M9903 Segmental and somatic dysfunction of lumbar region: Secondary | ICD-10-CM | POA: Diagnosis not present

## 2016-11-17 DIAGNOSIS — M545 Low back pain: Secondary | ICD-10-CM | POA: Diagnosis not present

## 2016-11-26 DIAGNOSIS — M1711 Unilateral primary osteoarthritis, right knee: Secondary | ICD-10-CM | POA: Diagnosis not present

## 2016-12-03 DIAGNOSIS — M1711 Unilateral primary osteoarthritis, right knee: Secondary | ICD-10-CM | POA: Diagnosis not present

## 2016-12-07 DIAGNOSIS — H401132 Primary open-angle glaucoma, bilateral, moderate stage: Secondary | ICD-10-CM | POA: Diagnosis not present

## 2016-12-10 DIAGNOSIS — M1711 Unilateral primary osteoarthritis, right knee: Secondary | ICD-10-CM | POA: Diagnosis not present

## 2016-12-14 DIAGNOSIS — M9903 Segmental and somatic dysfunction of lumbar region: Secondary | ICD-10-CM | POA: Diagnosis not present

## 2016-12-14 DIAGNOSIS — M9901 Segmental and somatic dysfunction of cervical region: Secondary | ICD-10-CM | POA: Diagnosis not present

## 2016-12-14 DIAGNOSIS — M545 Low back pain: Secondary | ICD-10-CM | POA: Diagnosis not present

## 2016-12-14 DIAGNOSIS — M542 Cervicalgia: Secondary | ICD-10-CM | POA: Diagnosis not present

## 2017-01-08 ENCOUNTER — Other Ambulatory Visit: Payer: Self-pay | Admitting: Internal Medicine

## 2017-01-20 ENCOUNTER — Encounter: Payer: Self-pay | Admitting: Internal Medicine

## 2017-02-07 DIAGNOSIS — H52203 Unspecified astigmatism, bilateral: Secondary | ICD-10-CM | POA: Diagnosis not present

## 2017-02-07 DIAGNOSIS — H401132 Primary open-angle glaucoma, bilateral, moderate stage: Secondary | ICD-10-CM | POA: Diagnosis not present

## 2017-02-16 ENCOUNTER — Other Ambulatory Visit: Payer: Self-pay | Admitting: Internal Medicine

## 2017-03-05 LAB — GLUCOSE, POCT (MANUAL RESULT ENTRY): POC Glucose: 118 mg/dl — AB (ref 70–99)

## 2017-03-10 ENCOUNTER — Telehealth: Payer: Self-pay | Admitting: Internal Medicine

## 2017-03-10 NOTE — Telephone Encounter (Signed)
[facility]

## 2017-03-10 NOTE — Telephone Encounter (Signed)
Flu shot was documented in pts chart.

## 2017-03-21 DIAGNOSIS — M9901 Segmental and somatic dysfunction of cervical region: Secondary | ICD-10-CM | POA: Diagnosis not present

## 2017-03-21 DIAGNOSIS — M9903 Segmental and somatic dysfunction of lumbar region: Secondary | ICD-10-CM | POA: Diagnosis not present

## 2017-03-21 DIAGNOSIS — M542 Cervicalgia: Secondary | ICD-10-CM | POA: Diagnosis not present

## 2017-03-21 DIAGNOSIS — M545 Low back pain: Secondary | ICD-10-CM | POA: Diagnosis not present

## 2017-03-28 DIAGNOSIS — M25551 Pain in right hip: Secondary | ICD-10-CM | POA: Diagnosis not present

## 2017-03-28 DIAGNOSIS — M48061 Spinal stenosis, lumbar region without neurogenic claudication: Secondary | ICD-10-CM | POA: Diagnosis not present

## 2017-04-19 DIAGNOSIS — M542 Cervicalgia: Secondary | ICD-10-CM | POA: Diagnosis not present

## 2017-04-19 DIAGNOSIS — M9903 Segmental and somatic dysfunction of lumbar region: Secondary | ICD-10-CM | POA: Diagnosis not present

## 2017-04-19 DIAGNOSIS — M9901 Segmental and somatic dysfunction of cervical region: Secondary | ICD-10-CM | POA: Diagnosis not present

## 2017-04-19 DIAGNOSIS — M545 Low back pain: Secondary | ICD-10-CM | POA: Diagnosis not present

## 2017-04-21 DIAGNOSIS — M9903 Segmental and somatic dysfunction of lumbar region: Secondary | ICD-10-CM | POA: Diagnosis not present

## 2017-04-21 DIAGNOSIS — M545 Low back pain: Secondary | ICD-10-CM | POA: Diagnosis not present

## 2017-04-21 DIAGNOSIS — M9901 Segmental and somatic dysfunction of cervical region: Secondary | ICD-10-CM | POA: Diagnosis not present

## 2017-04-21 DIAGNOSIS — M542 Cervicalgia: Secondary | ICD-10-CM | POA: Diagnosis not present

## 2017-04-30 ENCOUNTER — Other Ambulatory Visit: Payer: Self-pay | Admitting: Internal Medicine

## 2017-05-05 ENCOUNTER — Other Ambulatory Visit: Payer: Self-pay | Admitting: Internal Medicine

## 2017-05-10 DIAGNOSIS — M9901 Segmental and somatic dysfunction of cervical region: Secondary | ICD-10-CM | POA: Diagnosis not present

## 2017-05-10 DIAGNOSIS — M545 Low back pain: Secondary | ICD-10-CM | POA: Diagnosis not present

## 2017-05-10 DIAGNOSIS — M9903 Segmental and somatic dysfunction of lumbar region: Secondary | ICD-10-CM | POA: Diagnosis not present

## 2017-05-10 DIAGNOSIS — M542 Cervicalgia: Secondary | ICD-10-CM | POA: Diagnosis not present

## 2017-05-12 DIAGNOSIS — M1711 Unilateral primary osteoarthritis, right knee: Secondary | ICD-10-CM | POA: Diagnosis not present

## 2017-06-02 DIAGNOSIS — Z803 Family history of malignant neoplasm of breast: Secondary | ICD-10-CM | POA: Diagnosis not present

## 2017-06-02 DIAGNOSIS — Z1231 Encounter for screening mammogram for malignant neoplasm of breast: Secondary | ICD-10-CM | POA: Diagnosis not present

## 2017-06-14 DIAGNOSIS — M545 Low back pain: Secondary | ICD-10-CM | POA: Diagnosis not present

## 2017-06-14 DIAGNOSIS — M542 Cervicalgia: Secondary | ICD-10-CM | POA: Diagnosis not present

## 2017-06-14 DIAGNOSIS — M9903 Segmental and somatic dysfunction of lumbar region: Secondary | ICD-10-CM | POA: Diagnosis not present

## 2017-06-14 DIAGNOSIS — M9901 Segmental and somatic dysfunction of cervical region: Secondary | ICD-10-CM | POA: Diagnosis not present

## 2017-06-16 ENCOUNTER — Encounter (HOSPITAL_COMMUNITY): Payer: Self-pay | Admitting: *Deleted

## 2017-06-20 DIAGNOSIS — B373 Candidiasis of vulva and vagina: Secondary | ICD-10-CM | POA: Diagnosis not present

## 2017-06-26 ENCOUNTER — Ambulatory Visit: Payer: Self-pay | Admitting: Orthopedic Surgery

## 2017-06-26 NOTE — H&P (Signed)
Susan Jenkins, Susan Jenkins [id number], F)  DOB 1945/12/06  Chief Complaint Right Knee Pain H&P Right Total Knee Arthroplasty - 07/18/2017  Patient's Care Team Primary Care Provider: [name]: [address] [name], [place] [address], Ph [phone], Fax [phone] NPI: [id number] Patient's Pharmacies [id number] [place], [place] [address], Ph [phone], Fax [phone]   Vitals Ht: 5 ft 6 in BP - 126/68 Pulse - 56   Allergies Reviewed Allergies AUGMENTIN: Hives (Mild)  Possible Metal Allergy - Testing Pending at time of H&P   Medications Reviewed Medications amLODIPine 10 mg tablet 05/05/17   filled RXOPTIONS diclofenac 1 % topical gel as needed 03/26/17   filled RXOPTIONS fluconazole 150 mg tablet 06/20/17   filled RXOPTIONS hydroCHLOROthiazide 25 mg tablet 06/11/17   filled RXOPTIONS latanoprost 0.005 % eye drops INSTILL 1 DROP INTO EACH EYE ONCE DAILY AT BEDTIME 06/22/17   filled RXOPTIONS meloxicam 15 mg tablet 06/17/17   filled RXOPTIONS nystatin 100,000 unit/gram topical ointment 06/20/17   filled RXOPTIONS rosuvastatin 10 mg tablet 01/10/17   filled RXOPTIONS sulfamethoxazole 800 mg-trimethoprim 160 mg tablet 09/22/16   filled RXOPTIONS timolol maleate 0.5 % eye drops 04/01/17   filled RXOPTIONS triamcinolone acetonide 0.1 % topical ointment 06/20/17   filled RXOPTIONS venlafaxine ER 75 mg capsule,extended release 24 hr 05/16/17   filled RXOPTIONS            Problems Reviewed Problems Osteoarthritis of right knee joint   Family History Reviewed Family History Father - Father deceased Mother - Mother deceased   Social History Reviewed Social History Smoking Status: Former smoker Non-smoker Chewing tobacco: none Alcohol intake: Occasional Hand Dominance: Right Work related injury?: N Advance directive: Y Freight forwarder of Attorney: Y   Surgical History Reviewed Surgical History Ankle Surgery -  2001 - Left Ankle Procedure on back - 1999 - Dr. Shellia Carwin Knee Surgery - 1971 - Open Arthrotomy - Meniscal Surgery   Past Medical History Reviewed Past Medical History Blood Clots: Y - Postop Operative DVT and PE - 1971 High Cholesterol: Y Hypertension: Y Joint Pain: Y Previous Cortisone Injection(s): Y Previous Fracture(s): Y Previous Oral Steroid(s): Y Notes: Impaired Vision,  Vertigo,  Glaucoma,  Depression,  Heart Murmur,  Hemorrhoids,  History of DVT,  Postoperative Pulmonary Embolism    HPI Patient is a 72 year old female scheduled for right total knee by Dr. [name] on 07/18/17 at [facility]. Patient reports swelling, catching/locking, and instability. She reports right knee pain greater than 5 months. She describes the pain as aching and burning. She reports moderate. Temporary benefit with ice and NSAIDs. She states that her knee pain was worse at that time as well, but has since improved. Her RIGHT knee has gotten progressively worse and is bothering her at all times. It is limiting what she can and cannot do. She is having pain with activity and at rest. She would like to be more active but cannot do so because of the knee pain. The injections did not provide much benefit at all. She has had a stage when the knee is essentially taken over her life and she is ready to go ahead and get it fixed. AP and lateral of the RIGHT knee show bone-on-bone arthritis in the medial and patellofemoral compartments with varus deformity of the knee. They have had injections and failed nonoperative management. At this point the pain and dysfunction have essentially taken over their life. The most predictable means of improving pain and  function is total knee arthroplasty. We discussed the procedure risks, potential complications and rehab course in detail and the she would like to go ahead and proceed with total knee arthroplasty.   ROS Additionally reports: Reported by  Patient CONSTITUTIONAL: no Fever, no Chills, no Night Sweats, no Weight Loss  CARDIOVASCULAR: no Cough, no Shortness of Breath, no COPD, no Asthma  GASTROINTESTINAL: no Vomiting, no Nausea  MUSCULOSKELETAL: JOINT PAIN, but no Swelling in Joints  NEUROLOGIC: no Numbness, no Tingling/Paresthesias, no Difficulty with Balance  Physical Exam Patient is a 72 year old female.  General Mental Status - Alert, cooperative and good historian. General Appearance - pleasant, Not in acute distress. Orientation - Oriented X3. Build & Nutrition - Well nourished and Well developed.  Head and Neck Head - normocephalic, atraumatic . Neck Global Assessment - supple, no bruit auscultated on the right, no bruit auscultated on the left.  Eye Wears Glasses Pupil - Bilateral - PERR Motion - Bilateral - EOMI.  Chest and Lung Exam Auscultation Breath sounds - clear at anterior chest wall and clear at posterior chest wall. Adventitious sounds - No Adventitious sounds.  Cardiovascular Auscultation Rhythm - Regular rate and rhythm. Heart Sounds - S1 WNL and S2 WNL. Murmurs & Other Heart Sounds - Auscultation of the heart reveals - No Murmurs.  Abdomen Palpation/Percussion Tenderness - Abdomen is non-tender to palpation. Abdomen is soft. Auscultation Auscultation of the abdomen reveals - Bowel sounds normal.  Genitourinary Note: Not done, not pertinent to present illness  Musculoskeletal LEFT knee shows no swelling. Range of motion 0-125 with no tenderness or instability. RIGHT knee shows varus deformity. Range of motion is 10-115. There is marked crepitus on range of motion. She is tender medial greater than lateral with no instability noted. Her gait pattern is significantly antalgic on the RIGHT. Pulses sensation and motor are intact.  Radiographs AP and lateral of the RIGHT knee show bone-on-bone arthritis in the medial and patellofemoral compartments with varus deformity of the  knee.    Assessment / Plan 1. Osteoarthritis of right knee joint M17.11: Unilateral primary osteoarthritis, right knee  Patient Instructions Surgical Plans: Right Total Knee Replacement  Disposition: Home, straight to Outpatient PCP: Dr. Raliegh Ip Topical TXA  Anesthesia Issues: Nausea with Anesthesia Patient was instructed on what medications to stop prior to surgery. - Follow up visit in 2 weeks with Dr. [name] - Begin physical therapy following surgery. First PT EVAL for Postop Right Total Knee Arthroplasty on March 22. - Pre-operative lab work as pre Pre-Surgical Testing - Prescriptions will be provided in hospital at time of discharge  Return to [place], DPT for Physical Therapy Evaluation at [facility] on 07/22/2017 at 02:00 PM Gaynelle Arabian, MD for [place] at [facility] on 08/02/2017 at 01:15 PM  Encounter signed-off by Mickel Crow, PA-C

## 2017-06-28 ENCOUNTER — Ambulatory Visit (INDEPENDENT_AMBULATORY_CARE_PROVIDER_SITE_OTHER): Payer: PPO | Admitting: Allergy and Immunology

## 2017-06-28 ENCOUNTER — Encounter: Payer: Self-pay | Admitting: Allergy and Immunology

## 2017-06-28 VITALS — BP 128/60 | HR 54 | Temp 98.3°F | Resp 18 | Ht 66.0 in | Wt 237.2 lb

## 2017-06-28 DIAGNOSIS — L230 Allergic contact dermatitis due to metals: Secondary | ICD-10-CM | POA: Diagnosis not present

## 2017-06-28 NOTE — Progress Notes (Signed)
Dear Dr. [name],  Thank you for referring Susan Jenkins to the [facility] on 06/28/2017.   Below is a summation of this patient's evaluation and recommendations.  Thank you for your referral. I will keep you informed about this patient's response to treatment.   If you have any questions please do not hesitate to contact me.   Sincerely,  Jiles Prows, MD Allergy / Immunology [place]   ______________________________________________________________________    NEW PATIENT NOTE  Referring Provider: Gaynelle Arabian, MD Primary Provider: Marletta Lor, MD Date of office visit: 06/28/2017    Subjective:   Chief Complaint:  Susan Jenkins (DOB: October 02, 1945) is a 72 y.o. female who presents to the clinic on 06/28/2017 with a chief complaint of Allergy Testing (metal testing chromium,cobalt,nickle, titanium) .     HPI: Susan Jenkins presents to this clinic in evaluation of possible metal allergy.  She is scheduled for a right total knee replacement mid March 2019.  She has a history of developing contact dermatitis to earrings.  There is also a history of developing a contact dermatitis to the grommet on her pants that requires her to cover this metal with cloth material.  She has never had any other reactivity that she knows of to other metal contact and can wear metal glasses and gold earrings and a ring of unknown metal composition without any difficulty.  She does have a history of allergic rhinoconjunctivitis usually presenting during the spring and fall that she treats successfully with over-the-counter antihistamines.  Past Medical History:  Diagnosis Date  . Depression   . Glaucoma   . Hemorrhoids   . HOCM (hypertrophic obstructive cardiomyopathy) ([place])   . Hyperlipidemia   . Hypertension   . Pulmonary embolus ([place]) 1971   following surgery  . Vitamin D deficiency      Past Surgical History:  Procedure Laterality Date  . ANKLE SURGERY    . BACK SURGERY    . KNEE SURGERY    . NASAL SEPTUM SURGERY      Allergies as of 06/28/2017      Reactions   Amoxicillin-pot Clavulanate Anaphylaxis   Has patient had a PCN reaction causing immediate rash, facial/tongue/throat swelling, SOB or lightheadedness with hypotension: Yes Has patient had a PCN reaction causing severe rash involving mucus membranes or skin necrosis: Yes Has patient had a PCN reaction that required hospitalization Yes Has patient had a PCN reaction occurring within the last 10 years: No If all of the above answers are "NO", then may proceed with Cephalosporin use.      Medication List      amLODipine 10 MG tablet Commonly known as:  NORVASC TAKE 1 TABLET BY MOUTH EVERY DAY   aspirin 81 MG tablet Take 81 mg by mouth daily.   Calcium Carbonate-Vit D-Min 600-400 MG-UNIT Tabs Take 1 tablet by mouth daily.   diclofenac sodium 1 % Gel Commonly known as:  VOLTAREN APPLY 2 GRAMS TO AFFECTED AREA 2 TO 3 TIMES A DAY AS NEEDED   FISH OIL PO Take 1 capsule by mouth daily.   glucosamine-chondroitin 500-400 MG tablet Take 1 tablet by mouth daily.   hydrochlorothiazide 25 MG tablet Commonly known as:  HYDRODIURIL Take 1 tablet (25 mg total) by mouth daily.   ibuprofen 200 MG tablet Commonly known as:  ADVIL,MOTRIN Take 200 mg by mouth 2 (two) times daily.   KAOPECTATE  PO Take 10 mLs by mouth.   lactobacillus acidophilus Tabs tablet Take 1 tablet by mouth at bedtime.   latanoprost 0.005 % ophthalmic solution Commonly known as:  XALATAN Place 1 drop into both eyes at bedtime.   loratadine 10 MG tablet Commonly known as:  CLARITIN Take 10 mg by mouth daily.   meloxicam 15 MG tablet Commonly known as:  MOBIC TAKE 1 TABLET BY MOUTH ONCE DAILY FOR 2 WEEKS THEN FOOD, THEN AS NEEDED   multivitamin capsule Take 1 capsule by mouth daily.   OSTEO BI-FLEX TRIPLE STRENGTH  PO Take 1 tablet by mouth 2 (two) times daily.   pyridOXINE 50 MG tablet Commonly known as:  B-6 Take 50 mg by mouth daily.   rosuvastatin 10 MG tablet Commonly known as:  CRESTOR TAKE 1 TABLET BY MOUTH EVERY DAY   saccharomyces boulardii 250 MG capsule Commonly known as:  FLORASTOR Take 1 capsule (250 mg total) by mouth 2 (two) times daily.   timolol 0.5 % ophthalmic solution Commonly known as:  TIMOPTIC Place 1 drop into both eyes 2 (two) times daily.   triamcinolone ointment 0.1 % Commonly known as:  KENALOG APPLY TO AFFECTED AREA TWICE A DAY   venlafaxine XR 75 MG 24 hr capsule Commonly known as:  EFFEXOR-XR TAKE ONE CAPSULE BY MOUTH EVERY DAY       Review of systems negative except as noted in HPI / PMHx or noted below:  Review of Systems  Constitutional: Negative.   HENT: Negative.   Eyes: Negative.   Respiratory: Negative.   Cardiovascular: Negative.   Gastrointestinal: Negative.   Genitourinary: Negative.   Musculoskeletal: Negative.   Skin: Negative.   Neurological: Negative.   Endo/Heme/Allergies: Negative.   Psychiatric/Behavioral: Negative.     Family History  Problem Relation Age of Onset  . Dementia Mother   . Cancer Father   . Heart failure Father   . Allergic rhinitis Neg Hx   . Asthma Neg Hx   . Eczema Neg Hx   . Urticaria Neg Hx   . Angioedema Neg Hx     Social History   Socioeconomic History  . Marital status: Single    Spouse name: Not on file  . Number of children: Not on file  . Years of education: Not on file  . Highest education level: Not on file  Social Needs  . Financial resource strain: Not on file  . Food insecurity - worry: Not on file  . Food insecurity - inability: Not on file  . Transportation needs - medical: Not on file  . Transportation needs - non-medical: Not on file  Occupational History  . Not on file  Tobacco Use  . Smoking status: Former Smoker    Types: Cigarettes    Last attempt to quit: 05/03/1980     Years since quitting: 37.1  . Smokeless tobacco: Never Used  Substance and Sexual Activity  . Alcohol use: No  . Drug use: No  . Sexual activity: No  Other Topics Concern  . Not on file  Social History Narrative  . Not on file    Environmental and Social history  Lives in a house with a dry environment, no animals located inside the household, carpet in the bedroom, plastic on the bed, plastic on the pillow, and no smokers located inside the household.  Objective:   Vitals:   06/28/17 0818  BP: 128/60  Pulse: (!) 54  Resp: 18  Temp: 98.3 F (36.8 C)  Height: 5\' 6"  (167.6 cm) Weight: 237 lb 3.2 oz (107.6 kg)  Physical Exam  Constitutional: She is well-developed, well-nourished, and in no distress.  HENT:  Head: Normocephalic.  Right Ear: Tympanic membrane, external ear and ear canal normal.  Left Ear: Tympanic membrane, external ear and ear canal normal.  Nose: Nose normal. No mucosal edema or rhinorrhea.  Mouth/Throat: Uvula is midline, oropharynx is clear and moist and mucous membranes are normal. No oropharyngeal exudate.  Eyes: Conjunctivae are normal.  Neck: Trachea normal. No tracheal tenderness present. No tracheal deviation present. No thyromegaly present.  Cardiovascular: Normal rate, regular rhythm, S1 normal, S2 normal and normal heart sounds.  No murmur heard. Pulmonary/Chest: Breath sounds normal. No stridor. No respiratory distress. She has no wheezes. She has no rales.  Musculoskeletal: She exhibits no edema.  Lymphadenopathy:       Head (right side): No tonsillar adenopathy present.       Head (left side): No tonsillar adenopathy present.    She has no cervical adenopathy.  Neurological: She is alert. Gait normal.  Skin: No rash noted. She is not diaphoretic. No erythema. Nails show no clubbing.  Psychiatric: Mood and affect normal.    Diagnostics: Metal patch testing was placed on back today  Assessment and Plan:    1. Allergic contact  dermatitis due to metals     1.  Return to clinic in 48 hours for initial patch test read  Susan Jenkins had a metal patch test applied to her back today and we will have her perform initial read in 48 hours and subsequently a 7-day read.  Further evaluation and treatment will be based upon the results of that study.  Jiles Prows, MD Allergy / Immunology [facility]

## 2017-06-28 NOTE — Patient Instructions (Signed)
  1.  Return to clinic in 48 hours for initial patch test read

## 2017-06-29 ENCOUNTER — Encounter: Payer: Self-pay | Admitting: Allergy and Immunology

## 2017-06-30 ENCOUNTER — Ambulatory Visit: Payer: PPO | Admitting: Family Medicine

## 2017-06-30 ENCOUNTER — Encounter: Payer: Self-pay | Admitting: Family Medicine

## 2017-06-30 DIAGNOSIS — L230 Allergic contact dermatitis due to metals: Secondary | ICD-10-CM

## 2017-06-30 NOTE — Patient Instructions (Signed)
  Allergic contact dermatitis - The patient has been provided detailed information regarding the substances she is sensitive to, as well as products containing the substances.   - Meticulous avoidance of these substances is recommended.  - If avoidance is not possible, the use of barrier creams or lotions is recommended. - If symptoms persist or progress despite meticulous avoidance of nickel sulfate hexahydrate, Dermatology Referral may be warranted. - Return on Tuesday July 05, 2017 for the final patch reading.

## 2017-06-30 NOTE — Progress Notes (Signed)
   [address] [place]  [address] Dept: [phone]  FOLLOW UP NOTE  Patient ID: Susan Jenkins, female    DOB: 12-06-1945  Age: 72 y.o. MRN: [id number] Date of Office Visit: 06/30/2017  Assessment  Chief Complaint: No chief complaint on file.  HPI [name] presents for 48 hour metals test patch read.   Drug Allergies:  Allergies  Allergen Reactions  . Amoxicillin-Pot Clavulanate Anaphylaxis    Has patient had a PCN reaction causing immediate rash, facial/tongue/throat swelling, SOB or lightheadedness with hypotension: Yes Has patient had a PCN reaction causing severe rash involving mucus membranes or skin necrosis: Yes Has patient had a PCN reaction that required hospitalization Yes Has patient had a PCN reaction occurring within the last 10 years: No If all of the above answers are "NO", then may proceed with Cephalosporin use.        Assessment and Plan: 1. Allergic contact dermatitis due to metals    - The patient has been provided detailed information regarding the substances she is sensitive to, as well as products containing the substances.   - Meticulous avoidance of these substances is recommended.  - If avoidance is not possible, the use of barrier creams or lotions is recommended. - If symptoms persist or progress despite meticulous avoidance of nickel sulfate hexahydrate, Dermatology Referral may be warranted. - Return on Tuesday July 05, 2017 for the final patch reading.   Return in about 5 days (around 07/05/2017), or if symptoms worsen or fail to improve.    Thank you for the opportunity to care for this patient.  Please do not hesitate to contact me with questions.  Gareth Morgan, FNP Allergy and [facility]

## 2017-07-04 ENCOUNTER — Emergency Department (HOSPITAL_COMMUNITY): Payer: PPO

## 2017-07-04 ENCOUNTER — Emergency Department (HOSPITAL_COMMUNITY)
Admission: EM | Admit: 2017-07-04 | Discharge: 2017-07-04 | Disposition: A | Discharge status: Home / Self Care | Payer: PPO | Attending: Emergency Medicine | Admitting: Emergency Medicine

## 2017-07-04 ENCOUNTER — Encounter (HOSPITAL_COMMUNITY): Payer: Self-pay

## 2017-07-04 ENCOUNTER — Other Ambulatory Visit: Payer: Self-pay

## 2017-07-04 DIAGNOSIS — Y9389 Activity, other specified: Secondary | ICD-10-CM | POA: Insufficient documentation

## 2017-07-04 DIAGNOSIS — S0993XA Unspecified injury of face, initial encounter: Secondary | ICD-10-CM | POA: Insufficient documentation

## 2017-07-04 DIAGNOSIS — Z79899 Other long term (current) drug therapy: Secondary | ICD-10-CM | POA: Diagnosis not present

## 2017-07-04 DIAGNOSIS — Z87891 Personal history of nicotine dependence: Secondary | ICD-10-CM | POA: Insufficient documentation

## 2017-07-04 DIAGNOSIS — R404 Transient alteration of awareness: Secondary | ICD-10-CM | POA: Diagnosis not present

## 2017-07-04 DIAGNOSIS — S8992XA Unspecified injury of left lower leg, initial encounter: Secondary | ICD-10-CM | POA: Diagnosis not present

## 2017-07-04 DIAGNOSIS — R42 Dizziness and giddiness: Secondary | ICD-10-CM | POA: Diagnosis not present

## 2017-07-04 DIAGNOSIS — W010XXA Fall on same level from slipping, tripping and stumbling without subsequent striking against object, initial encounter: Secondary | ICD-10-CM | POA: Insufficient documentation

## 2017-07-04 DIAGNOSIS — S199XXA Unspecified injury of neck, initial encounter: Secondary | ICD-10-CM | POA: Diagnosis not present

## 2017-07-04 DIAGNOSIS — Z23 Encounter for immunization: Secondary | ICD-10-CM | POA: Insufficient documentation

## 2017-07-04 DIAGNOSIS — I10 Essential (primary) hypertension: Secondary | ICD-10-CM | POA: Diagnosis not present

## 2017-07-04 DIAGNOSIS — Y92410 Unspecified street and highway as the place of occurrence of the external cause: Secondary | ICD-10-CM | POA: Diagnosis not present

## 2017-07-04 DIAGNOSIS — M25531 Pain in right wrist: Secondary | ICD-10-CM | POA: Diagnosis not present

## 2017-07-04 DIAGNOSIS — S6991XA Unspecified injury of right wrist, hand and finger(s), initial encounter: Secondary | ICD-10-CM | POA: Diagnosis not present

## 2017-07-04 DIAGNOSIS — S0990XA Unspecified injury of head, initial encounter: Secondary | ICD-10-CM | POA: Diagnosis not present

## 2017-07-04 DIAGNOSIS — W19XXXA Unspecified fall, initial encounter: Secondary | ICD-10-CM

## 2017-07-04 DIAGNOSIS — Z7982 Long term (current) use of aspirin: Secondary | ICD-10-CM | POA: Insufficient documentation

## 2017-07-04 DIAGNOSIS — M25562 Pain in left knee: Secondary | ICD-10-CM | POA: Diagnosis not present

## 2017-07-04 DIAGNOSIS — Y999 Unspecified external cause status: Secondary | ICD-10-CM | POA: Insufficient documentation

## 2017-07-04 DIAGNOSIS — S0181XA Laceration without foreign body of other part of head, initial encounter: Secondary | ICD-10-CM | POA: Diagnosis not present

## 2017-07-04 DIAGNOSIS — S61411A Laceration without foreign body of right hand, initial encounter: Secondary | ICD-10-CM | POA: Diagnosis not present

## 2017-07-04 MED ORDER — TETANUS-DIPHTH-ACELL PERTUSSIS 5-2.5-18.5 LF-MCG/0.5 IM SUSP
0.5000 mL | Freq: Once | INTRAMUSCULAR | Status: AC
  Administered 2017-07-04: 0.5 mL via INTRAMUSCULAR
  Filled 2017-07-04: qty 0.5

## 2017-07-04 MED ORDER — ACETAMINOPHEN 325 MG PO TABS
650.0000 mg | ORAL_TABLET | Freq: Once | ORAL | Status: AC
  Administered 2017-07-04: 650 mg via ORAL
  Filled 2017-07-04: qty 2

## 2017-07-04 MED ORDER — ONDANSETRON 4 MG PO TBDP: 4.0000 mg | ORAL_TABLET | Freq: Three times a day (TID) | ORAL | 0 refills | Status: DC | PRN

## 2017-07-04 NOTE — ED Notes (Signed)
Bed: WA06 Expected date:  Expected time:  Means of arrival:  Comments: Hold for triage-not clean yet

## 2017-07-04 NOTE — Discharge Instructions (Signed)
Take 650 mg of Tylenol once every 6 hours as needed for pain or for headache.  Apply ice for 15-20 minutes as frequently as needed to help with swelling around your left eye. You may take one tablet of Zofran and let it dissolve under your tongue once every 8 hours as needed for nausea.  For the small abrasions on her face, clean the area at least 1-2 times daily with warm water and soap.  Then apply a topical antibiotic such as Neosporin or bacitracin to the cuts. Avoiding submerging your head or swimming underwater until the wounds heal.   I have attached information on signs and symptoms of a concussion, if you develop any of these symptoms you can follow up with the [facility]. Their information is listed above.   If you develop any new or worsening symptoms including the worst headache of your life, vomiting, changes in your vision, weakness, or numbness over the next few days, please return to the emergency department for re-evaluation.

## 2017-07-04 NOTE — ED Triage Notes (Signed)
Per EMS-tripped and fell forward-hematoma above left eye, abrasion to left side of face-no blood thinners, complaining of a headache

## 2017-07-04 NOTE — ED Provider Notes (Signed)
[facility] DEPT Provider Note   CSN: [id number] Arrival date & time: 07/04/17  1044     History   Chief Complaint Chief Complaint  Patient presents with  . Fall    HPI Susan Jenkins is a 72 y.o. female with a h/o of OA, HTN, HLD, MVP, hypertrophic obstructive cardiomyopathy, and PE who presents to the emergency department by EMS after she sustained around 10 AM.  The patient states that she was walking through a parking lot on her way to an appointment with her OB/GYN when she tripped and fell forward. She thinks that her left knee and right wrist hit the ground before her face hit the concrete. After she fell, she rolled to the side, almost under a car, and required assistance from a passerby who was able to roll her onto her back and helped her sit up because the ground was cold. She reports questionable LOC for <5 seconds after the initial fall with nausea that has since resolved. No emesis.  In the ED, she complains of an allover throbbing headache that began after the fall and has gradually worsened with tingling to the top of her head. She also complains of pain to the bridge of the nose and the left cheek. She also endorses right wrist and left knee pain. No CP, dyspnea, neck, back, or hip pain, fever, chills, numbness, weakness, or visual changes. No treatment prior to arrival. No anticoagulation.   She is scheduled for a right TKA with Dr. [name] on 03/19.   The history is provided by the patient. No language interpreter was used.    Past Medical History:  Diagnosis Date  . Depression   . Glaucoma   . Hemorrhoids   . HOCM (hypertrophic obstructive cardiomyopathy) ([place])   . Hyperlipidemia   . Hypertension   . Pulmonary embolus ([place]) 1971   following surgery  . Vitamin D deficiency     Patient Active Problem List   Diagnosis Date Noted  . Allergic contact dermatitis due to metals 06/30/2017  . Acute colitis 09/29/2015  . Hyponatremia  09/29/2015  . Family history of cancer of GI tract 08/29/2013  . HOCM (hypertrophic obstructive cardiomyopathy) ([place]) 06/11/2010  . OTHER SPECIFIED DISORDERS OF LIVER 05/07/2010  . UNSPECIFIED VITAMIN D DEFICIENCY 03/17/2010  . Hyperlipemia 03/17/2010  . DEPRESSION 03/17/2010  . GLUCOMA 03/17/2010  . DIPLOPIA 03/17/2010  . Essential hypertension 03/17/2010  . MITRAL VALVE PROLAPSE 03/17/2010  . UNSPEC HEMORRHOIDS WITHOUT MENTION COMPLICATION 51/06/5850  . BLOOD IN STOOL 03/17/2010  . JOINT PAIN, HAND 03/17/2010  . SCIATICA 03/17/2010  . BACK PAIN 03/17/2010  . CHEST PAIN 03/17/2010    Past Surgical History:  Procedure Laterality Date  . ANKLE SURGERY    . BACK SURGERY    . KNEE SURGERY    . NASAL SEPTUM SURGERY      OB History    No data available       Home Medications    Prior to Admission medications   Medication Sig Start Date End Date Taking? Authorizing Provider  amLODipine (NORVASC) 10 MG tablet TAKE 1 TABLET BY MOUTH EVERY DAY 05/05/17  Yes Marletta Lor, MD  aspirin 81 MG tablet Take 81 mg by mouth daily.   Yes [provider]  Calcium Carbonate-Vit D-Min 600-400 MG-UNIT TABS Take 1 tablet by mouth daily.   Yes [provider]  glucosamine-chondroitin 500-400 MG tablet Take 1 tablet by mouth 2 (two) times daily.  Yes [provider]  hydrochlorothiazide (HYDRODIURIL) 25 MG tablet Take 1 tablet (25 mg total) by mouth daily. 09/21/16  Yes Marletta Lor, MD  lactobacillus acidophilus (BACID) TABS tablet Take 1 tablet by mouth at bedtime.   Yes [provider]  latanoprost (XALATAN) 0.005 % ophthalmic solution Place 1 drop into both eyes at bedtime.   Yes [provider]  meloxicam (MOBIC) 15 MG tablet TAKE 1 TABLET BY MOUTH ONCE DAILY FOR 2 WEEKS THEN FOOD, THEN AS NEEDED PAIN 06/17/17  Yes [provider]  Multiple Vitamin (MULTIVITAMIN) capsule Take 1 capsule by mouth daily.   Yes [provider]  Omega-3 Fatty Acids (FISH OIL PO) Take 1 capsule by mouth daily.   Yes [provider]  pyridOXINE (B-6) 50 MG tablet Take 50 mg by mouth daily.   Yes [provider]  rosuvastatin (CRESTOR) 10 MG tablet TAKE 1 TABLET BY MOUTH EVERY DAY 05/02/17  Yes Marletta Lor, MD  timolol (TIMOPTIC) 0.5 % ophthalmic solution Place 1 drop into both eyes 2 (two) times daily.    Yes [provider]  venlafaxine XR (EFFEXOR-XR) 75 MG 24 hr capsule TAKE ONE CAPSULE BY MOUTH EVERY DAY 02/16/17  Yes Marletta Lor, MD  Bismuth Subsalicylate (KAOPECTATE PO) Take 10 mLs by mouth daily as needed (diarrhea).     [provider]  diclofenac sodium (VOLTAREN) 1 % GEL APPLY 2 GRAMS TO AFFECTED AREA 2 TO 3 TIMES A DAY AS NEEDED PAIN 03/26/17   [provider]  ondansetron (ZOFRAN ODT) 4 MG disintegrating tablet Take 1 tablet (4 mg total) by mouth every 8 (eight) hours as needed for nausea or vomiting. 07/04/17   Reeve Mallo A, PA-C  saccharomyces boulardii (FLORASTOR) 250 MG capsule Take 1 capsule (250 mg total) by mouth 2 (two) times daily. Patient not taking: Reported on 07/04/2017 10/04/15   Debbe Odea, MD    Family History Family History  Problem Relation Age of Onset  . Dementia Mother   . Cancer Father   . Heart failure Father   . Allergic rhinitis Neg Hx   . Asthma Neg Hx   . Eczema Neg Hx   . Urticaria Neg Hx   . Angioedema Neg Hx     Social History Social History   Tobacco Use  . Smoking status: Former Smoker    Types: Cigarettes    Last attempt to quit: 05/03/1980    Years since quitting: 37.1  . Smokeless tobacco: Never Used  Substance Use Topics  . Alcohol use: No  . Drug use: No     Allergies   Amoxicillin-pot clavulanate   Review of Systems Review of Systems  Constitutional: Negative for activity change, chills and fever.  HENT: Positive for facial swelling.   Eyes: Negative for visual disturbance.    Respiratory: Negative for shortness of breath.   Cardiovascular: Negative for chest pain.  Gastrointestinal: Positive for nausea. Negative for abdominal pain and vomiting.  Genitourinary: Negative for dysuria.  Musculoskeletal: Positive for arthralgias and myalgias. Negative for back pain, gait problem, joint swelling, neck pain and neck stiffness.  Skin: Positive for wound. Negative for rash.  Allergic/Immunologic: Negative for immunocompromised state.  Neurological: Positive for headaches. Negative for dizziness, weakness, light-headedness and numbness.       Paresthesias  Psychiatric/Behavioral: Negative for confusion.   Physical Exam Updated Vital Signs BP (!) 137/56 (BP Location: Right Arm)   Pulse (!) 50   Temp 98.4 F (36.9  C) (Oral)   Resp 16   Ht 5\' 6"  (1.676 m)   Wt 107.5 kg (237 lb)   SpO2 98%   BMI 38.25 kg/m   Physical Exam  Constitutional: She is oriented to person, place, and time. No distress.  HENT:  Head: Normocephalic.  Right Ear: A middle ear effusion is present. No hemotympanum.  Left Ear: A middle ear effusion is present. No hemotympanum.  Nose: Sinus tenderness present. No nasal deformity, septal deviation or nasal septal hematoma. No epistaxis.    Mouth/Throat: Uvula is midline, oropharynx is clear and moist and mucous membranes are normal. No posterior oropharyngeal erythema.  No gross blood noted in the posterior oropharynx. No loose or missing teeth. No focal tenderness to teeth or gingiva. Mild increased pain with opening of the jaw.  Scalp is atraumatic.  Eyes: Conjunctivae are normal.  Neck: Neck supple.  Cardiovascular: Normal rate, regular rhythm, normal heart sounds and intact distal pulses. Exam reveals no gallop and no friction rub.  No murmur heard. Pulses:      Radial pulses are 2+ on the right side, and 2+ on the left side.       Dorsalis pedis pulses are 2+ on the right side, and 2+ on the left side.       Posterior tibial pulses are  2+ on the right side, and 2+ on the left side.  Pulmonary/Chest: Effort normal. No stridor. No respiratory distress. She has no wheezes. She has no rales.  Abdominal: Soft. She exhibits no distension and no mass. There is no tenderness. There is no rebound and no guarding. No hernia.  Musculoskeletal: Normal range of motion. She exhibits tenderness. She exhibits no deformity.  No obvious deformities, crepitus, or step-offs.  Head to toe exam.  No tenderness to palpation to the spinous processes of the cervical, thoracic, or lumbar spine or surrounding bilateral paraspinal muscles. Hips and pelvis are non-tender to palpation. Bilateral shoulder, elbows, and ankles are non-tender.   Mild pain with ROM to the right wrist and left knee. She has full active and passive ROM of both.  Tender to palpation to the tibial plateau of the left knee.  No point tenderness to the right wrist.  No anatomic snuffbox tenderness.  Neurological: She is alert and oriented to person, place, and time.  GCS 15. Follows commands and speaking in complete, fluent sentences. Cranial nerves II through XII are grossly intact.  Normal finger to nose bilaterally.  5 out of 5 strength against resistance of the bilateral upper and lower extremities.  Sensation is intact throughout.  No pronator drift.  Negative Romberg.  Symmetric tandem gait.  Skin: Skin is warm. Capillary refill takes less than 2 seconds. No rash noted. She is not diaphoretic.  Superficial skin tear noted to the palm of the right hand.  Hemostatic skin tear to the bridge of the nose. Superficial skin tear to the left cheek with mild surrounding edema and erythema.  No warmth. Erythema and mild edema noted to the superiorly to the left periorbital area with minimal overlying superficial abrasion.  No ecchymosis noted throughout.   Psychiatric: Her behavior is normal.  Nursing note and vitals reviewed.    ED Treatments / Results  Labs (all labs ordered are  listed, but only abnormal results are displayed) Labs Reviewed - No data to display  EKG  EKG Interpretation None       Radiology Dg Wrist Complete Right  Result Date: 07/04/2017 CLINICAL DATA:  Per  EMS-tripped and fell forward-hematoma above left eye, abrasion to left side of face-no blood thinners, complaining of a headache, left knee and right wrist. EXAM: RIGHT WRIST - COMPLETE 3+ VIEW COMPARISON:  None. FINDINGS: No fracture.  No bone lesion. The joints are normally aligned. There is joint space narrowing involving the scaphoid trapezium trapezoid articulation and the trapezium first metacarpal articulation consistent with osteoarthritis. Remaining joints are normally spaced and aligned. Soft tissues are unremarkable. IMPRESSION: No fracture or dislocation. Electronically Signed   By: Lajean Manes M.D.   On: 07/04/2017 12:59   Ct Head Wo Contrast  Result Date: 07/04/2017 CLINICAL DATA:  Fall, facial injury EXAM: CT HEAD WITHOUT CONTRAST CT MAXILLOFACIAL WITHOUT CONTRAST CT CERVICAL SPINE WITHOUT CONTRAST TECHNIQUE: Multidetector CT imaging of the head, cervical spine, and maxillofacial structures were performed using the standard protocol without intravenous contrast. Multiplanar CT image reconstructions of the cervical spine and maxillofacial structures were also generated. COMPARISON:  None. FINDINGS: CT HEAD FINDINGS Brain: Ventricle size normal. Mild changes in the cerebral white matter bilaterally appear chronic. No acute infarct, hemorrhage, or mass. Negative for fluid collection or midline shift. Vascular: Negative for hyperdense vessel Skull: Negative Other: None CT MAXILLOFACIAL FINDINGS Osseous: Negative for facial fracture Orbits: Negative Sinuses: Mild mucosal edema paranasal sinuses. 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 Small fluid level in the mastoid tip on the right. Left mastoid sinus clear Soft tissues: Diffuse dermal  calcifications. No acute soft tissue swelling CT CERVICAL SPINE FINDINGS Alignment: 2 mm anterolisthesis C4-5 felt to be degenerative. Disc and facet degeneration throughout the cervical spine. Spondylitic change most prominent at C5-6 and C6-7 Skull base and vertebrae: Negative for fracture or Soft tissues and spinal canal: Multilevel spinal stenosis due to spurring. No soft tissue mass Disc levels: Disc degeneration and spondylosis diffusely most prominent C5-6 and C6-7. 2 mm anterolisthesis C4-5. Prominent left foraminal encroachment C6-7 due to osteophyte. Right foraminal encroachment C5-6 and C6-7 due to osteophyte Upper chest: Negative Other: None IMPRESSION: 1. No acute intracranial abnormality. Mild chronic appearing white matter changes 2. Negative for facial fracture.  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 Negative for cervical spine fracture. Multilevel cervical spondylosis. Electronically Signed   By: Franchot Gallo M.D.   On: 07/04/2017 16:07   Ct Cervical Spine Wo Contrast  Result Date: 07/04/2017 CLINICAL DATA:  Fall, facial injury EXAM: CT HEAD WITHOUT CONTRAST CT MAXILLOFACIAL WITHOUT CONTRAST CT CERVICAL SPINE WITHOUT CONTRAST TECHNIQUE: Multidetector CT imaging of the head, cervical spine, and maxillofacial structures were performed using the standard protocol without intravenous contrast. Multiplanar CT image reconstructions of the cervical spine and maxillofacial structures were also generated. COMPARISON:  None. FINDINGS: CT HEAD FINDINGS Brain: Ventricle size normal. Mild changes in the cerebral white matter bilaterally appear chronic. No acute infarct, hemorrhage, or mass. Negative for fluid collection or midline shift. Vascular: Negative for hyperdense vessel Skull: Negative Other: None CT MAXILLOFACIAL FINDINGS Osseous: Negative for facial fracture Orbits: Negative Sinuses: Mild mucosal edema paranasal sinuses. 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 Small fluid level in the mastoid tip on the right. Left mastoid sinus clear Soft tissues: Diffuse dermal calcifications. No acute soft tissue swelling CT CERVICAL SPINE FINDINGS Alignment: 2 mm anterolisthesis C4-5 felt to be degenerative. Disc and facet degeneration throughout the cervical spine. Spondylitic change most prominent at C5-6 and C6-7 Skull base and vertebrae: Negative for fracture or Soft tissues and spinal canal: Multilevel spinal stenosis due to  spurring. No soft tissue mass Disc levels: Disc degeneration and spondylosis diffusely most prominent C5-6 and C6-7. 2 mm anterolisthesis C4-5. Prominent left foraminal encroachment C6-7 due to osteophyte. Right foraminal encroachment C5-6 and C6-7 due to osteophyte Upper chest: Negative Other: None IMPRESSION: 1. No acute intracranial abnormality. Mild chronic appearing white matter changes 2. Negative for facial fracture.  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 Negative for cervical spine fracture. Multilevel cervical spondylosis. Electronically Signed   By: Franchot Gallo M.D.   On: 07/04/2017 16:07   Dg Knee Complete 4 Views Left  Result Date: 07/04/2017 CLINICAL DATA:  Per EMS-tripped and fell forward-hematoma above left eye, abrasion to left side of face-no blood thinners, complaining of a headache, left knee and right wrist. EXAM: LEFT KNEE - COMPLETE 4+ VIEW COMPARISON:  None. FINDINGS: No fracture.  No bone lesion. Moderate narrowing of the medial joint space compartment. Small marginal osteophytes from the medial compartment. No joint effusion. Surrounding soft tissues are unremarkable. IMPRESSION: 1. No fracture or acute finding. 2. Mild-to-moderate osteoarthritis involving the medial compartment. Electronically Signed   By: Lajean Manes M.D.   On: 07/04/2017 12:06   Ct Maxillofacial Wo Contrast  Result Date: 07/04/2017 CLINICAL DATA:  Fall, facial injury EXAM: CT HEAD WITHOUT CONTRAST CT MAXILLOFACIAL WITHOUT CONTRAST CT  CERVICAL SPINE WITHOUT CONTRAST TECHNIQUE: Multidetector CT imaging of the head, cervical spine, and maxillofacial structures were performed using the standard protocol without intravenous contrast. Multiplanar CT image reconstructions of the cervical spine and maxillofacial structures were also generated. COMPARISON:  None. FINDINGS: CT HEAD FINDINGS Brain: Ventricle size normal. Mild changes in the cerebral white matter bilaterally appear chronic. No acute infarct, hemorrhage, or mass. Negative for fluid collection or midline shift. Vascular: Negative for hyperdense vessel Skull: Negative Other: None CT MAXILLOFACIAL FINDINGS Osseous: Negative for facial fracture Orbits: Negative Sinuses: Mild mucosal edema paranasal sinuses. 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 Small fluid level in the mastoid tip on the right. Left mastoid sinus clear Soft tissues: Diffuse dermal calcifications. No acute soft tissue swelling CT CERVICAL SPINE FINDINGS Alignment: 2 mm anterolisthesis C4-5 felt to be degenerative. Disc and facet degeneration throughout the cervical spine. Spondylitic change most prominent at C5-6 and C6-7 Skull base and vertebrae: Negative for fracture or Soft tissues and spinal canal: Multilevel spinal stenosis due to spurring. No soft tissue mass Disc levels: Disc degeneration and spondylosis diffusely most prominent C5-6 and C6-7. 2 mm anterolisthesis C4-5. Prominent left foraminal encroachment C6-7 due to osteophyte. Right foraminal encroachment C5-6 and C6-7 due to osteophyte Upper chest: Negative Other: None IMPRESSION: 1. No acute intracranial abnormality. Mild chronic appearing white matter changes 2. Negative for facial fracture.  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 Negative for cervical spine fracture. Multilevel cervical spondylosis. Electronically Signed   By: Franchot Gallo M.D.   On: 07/04/2017 16:07    Procedures Procedures (including critical care  time)  Medications Ordered in ED Medications  Tdap (BOOSTRIX) injection 0.5 mL (not administered)  acetaminophen (TYLENOL) tablet 650 mg (650 mg Oral Given 07/04/17 1554)     Initial Impression / Assessment and Plan / ED Course  I have reviewed the triage vital signs and the nursing notes.  Pertinent labs & imaging results that were available during my care of the patient were reviewed by me and considered in my medical decision making (see chart for details).     72 year old female with a h/o of OA, HTN, HLD, MVP, hypertrophic obstructive  cardiomyopathy, and PE who presents after fall. Tdap updated. No focal neurological deficits. CT head, cervical spine, and maxillofacial are unremarkable.  X-ray of the left knee and right wrist are negative for fracture.  Established with Dr. Reynaldo Minium with orthopedics for follow-up and is scheduled for a right TKA later this month.  The patient was discussed with Dr. Tamera Punt, attending physician.  Headache improved after Tylenol in the ED.  She has no complaints at this time.  Strict return precautions given.  We will discharge the patient with Zofran for nausea. VSS. NAD.  The patient is safe for discharge home at this time.  Final Clinical Impressions(s) / ED Diagnoses   Final diagnoses:  Fall, initial encounter  Facial injury, initial encounter    ED Discharge Orders        Ordered    ondansetron (ZOFRAN ODT) 4 MG disintegrating tablet  Every 8 hours PRN     07/04/17 1642       Emillio Ngo A, PA-C 07/04/17 1645    Malvin Johns, MD 07/04/17 1702

## 2017-07-05 ENCOUNTER — Ambulatory Visit: Payer: PPO | Admitting: Allergy and Immunology

## 2017-07-05 ENCOUNTER — Encounter: Payer: Self-pay | Admitting: Allergy and Immunology

## 2017-07-05 DIAGNOSIS — H401132 Primary open-angle glaucoma, bilateral, moderate stage: Secondary | ICD-10-CM | POA: Diagnosis not present

## 2017-07-05 DIAGNOSIS — L230 Allergic contact dermatitis due to metals: Secondary | ICD-10-CM

## 2017-07-05 NOTE — Progress Notes (Signed)
Susan Jenkins returns to this clinic for her day 7 middle patch test read.  She had a plus 4 reaction to nickel sulfate hexahydrate 5%.  She did not demonstrate any hypersensitivity reaction to chromium chloride 1%, cobalt chloride hexahydrate 1%, molybenum chloride 0.5%, tantal, potassium dichromate 0.25%, copper sulfate penta hydrate 2%, titanium 0.1%, manganese chloride 0.5%.  We will pass on this information to her surgeon in consideration of her upcoming surgical procedure.

## 2017-07-11 DIAGNOSIS — M9901 Segmental and somatic dysfunction of cervical region: Secondary | ICD-10-CM | POA: Diagnosis not present

## 2017-07-11 DIAGNOSIS — M542 Cervicalgia: Secondary | ICD-10-CM | POA: Diagnosis not present

## 2017-07-11 DIAGNOSIS — M545 Low back pain: Secondary | ICD-10-CM | POA: Diagnosis not present

## 2017-07-11 DIAGNOSIS — M9903 Segmental and somatic dysfunction of lumbar region: Secondary | ICD-10-CM | POA: Diagnosis not present

## 2017-07-11 NOTE — Patient Instructions (Addendum)
Susan Jenkins  07/11/2017   Your procedure is scheduled on: 07-18-17   Report to [facility] Main  Entrance Report to Admitting at 10:10 AM   Call this number if you have problems the morning of surgery [phone]   Remember: Do not eat food or drink liquids :After Midnight.     Take these medicines the morning of surgery with A SIP OF WATER: Amlodipine (Norvas). You may also bring and use your eyedrops as needed.                                You may not have any metal on your body including hair pins and              piercings  Do not wear jewelry, make-up, lotions, powders or perfumes, deodorant             Do not wear nail polish.  Do not shave  48 hours prior to surgery.                 Do not bring valuables to the hospital. [place].  Contacts, dentures or bridgework may not be worn into surgery.  Leave suitcase in the car. After surgery it may be brought to your room.              Please read over the following fact sheets you were given: _____________________________________________________________________           [facility] - Preparing for Surgery Before surgery, you can play an important role.  Because skin is not sterile, your skin needs to be as free of germs as possible.  You can reduce the number of germs on your skin by washing with CHG (chlorahexidine gluconate) soap before surgery.  CHG is an antiseptic cleaner which kills germs and bonds with the skin to continue killing germs even after washing. Please DO NOT use if you have an allergy to CHG or antibacterial soaps.  If your skin becomes reddened/irritated stop using the CHG and inform your nurse when you arrive at Short Stay. Do not shave (including legs and underarms) for at least 48 hours prior to the first CHG shower.  You may shave your face/neck. Please follow these instructions carefully:  1.  Shower with CHG Soap the night  before surgery and the  morning of Surgery.  2.  If you choose to wash your hair, wash your hair first as usual with your  normal  shampoo.  3.  After you shampoo, rinse your hair and body thoroughly to remove the  shampoo.                           4.  Use CHG as you would any other liquid soap.  You can apply chg directly  to the skin and wash                       Gently with a scrungie or clean washcloth.  5.  Apply the CHG Soap to your body ONLY FROM THE NECK DOWN.   Do not use on face/ open  Wound or open sores. Avoid contact with eyes, ears mouth and genitals (private parts).                       Wash face,  Genitals (private parts) with your normal soap.             6.  Wash thoroughly, paying special attention to the area where your surgery  will be performed.  7.  Thoroughly rinse your body with warm water from the neck down.  8.  DO NOT shower/wash with your normal soap after using and rinsing off  the CHG Soap.                9.  Pat yourself dry with a clean towel.            10.  Wear clean pajamas.            11.  Place clean sheets on your bed the night of your first shower and do not  sleep with pets. Day of Surgery : Do not apply any lotions/deodorants the morning of surgery.  Please wear clean clothes to the hospital/surgery center.  FAILURE TO FOLLOW THESE INSTRUCTIONS MAY RESULT IN THE CANCELLATION OF YOUR SURGERY PATIENT SIGNATURE_________________________________  NURSE SIGNATURE__________________________________  ________________________________________________________________________   Susan Jenkins  An incentive spirometer is a tool that can help keep your lungs clear and active. This tool measures how well you are filling your lungs with each breath. Taking long deep breaths may help reverse or decrease the chance of developing breathing (pulmonary) problems (especially infection) following:  A long period of time when you are  unable to move or be active. BEFORE THE PROCEDURE   If the spirometer includes an indicator to show your best effort, your nurse or respiratory therapist will set it to a desired goal.  If possible, sit up straight or lean slightly forward. Try not to slouch.  Hold the incentive spirometer in an upright position. INSTRUCTIONS FOR USE  1. Sit on the edge of your bed if possible, or sit up as far as you can in bed or on a chair. 2. Hold the incentive spirometer in an upright position. 3. Breathe out normally. 4. Place the mouthpiece in your mouth and seal your lips tightly around it. 5. Breathe in slowly and as deeply as possible, raising the piston or the ball toward the top of the column. 6. Hold your breath for 3-5 seconds or for as long as possible. Allow the piston or ball to fall to the bottom of the column. 7. Remove the mouthpiece from your mouth and breathe out normally. 8. Rest for a few seconds and repeat Steps 1 through 7 at least 10 times every 1-2 hours when you are awake. Take your time and take a few normal breaths between deep breaths. 9. The spirometer may include an indicator to show your best effort. Use the indicator as a goal to work toward during each repetition. 10. After each set of 10 deep breaths, practice coughing to be sure your lungs are clear. If you have an incision (the cut made at the time of surgery), support your incision when coughing by placing a pillow or rolled up towels firmly against it. Once you are able to get out of bed, walk around indoors and cough well. You may stop using the incentive spirometer when instructed by your caregiver.  RISKS AND COMPLICATIONS  Take your time so you do not get  dizzy or light-headed.  If you are in pain, you may need to take or ask for pain medication before doing incentive spirometry. It is harder to take a deep breath if you are having pain. AFTER USE  Rest and breathe slowly and easily.  It can be helpful to  keep track of a log of your progress. Your caregiver can provide you with a simple table to help with this. If you are using the spirometer at home, follow these instructions: [place] IF:   You are having difficultly using the spirometer.  You have trouble using the spirometer as often as instructed.  Your pain medication is not giving enough relief while using the spirometer.  You develop fever of 100.5 F (38.1 C) or higher. SEEK IMMEDIATE MEDICAL CARE IF:   You cough up bloody sputum that had not been present before.  You develop fever of 102 F (38.9 C) or greater.  You develop worsening pain at or near the incision site. MAKE SURE YOU:   Understand these instructions.  Will watch your condition.  Will get help right away if you are not doing well or get worse. Document Released: 08/30/2006 Document Revised: 07/12/2011 Document Reviewed: 10/31/2006 ExitCare Patient Information 2014 ExitCare, [place].   ________________________________________________________________________  WHAT IS A BLOOD TRANSFUSION? Blood Transfusion Information  A transfusion is the replacement of blood or some of its parts. Blood is made up of multiple cells which provide different functions.  Red blood cells carry oxygen and are used for blood loss replacement.  White blood cells fight against infection.  Platelets control bleeding.  Plasma helps clot blood.  Other blood products are available for specialized needs, such as hemophilia or other clotting disorders. BEFORE THE TRANSFUSION  Who gives blood for transfusions?   Healthy volunteers who are fully evaluated to make sure their blood is safe. This is blood bank blood. Transfusion therapy is the safest it has ever been in the practice of medicine. Before blood is taken from a donor, a complete history is taken to make sure that person has no history of diseases nor engages in risky social behavior (examples are intravenous drug  use or sexual activity with multiple partners). The donor's travel history is screened to minimize risk of transmitting infections, such as malaria. The donated blood is tested for signs of infectious diseases, such as HIV and hepatitis. The blood is then tested to be sure it is compatible with you in order to minimize the chance of a transfusion reaction. If you or a relative donates blood, this is often done in anticipation of surgery and is not appropriate for emergency situations. It takes many days to process the donated blood. RISKS AND COMPLICATIONS Although transfusion therapy is very safe and saves many lives, the main dangers of transfusion include:   Getting an infectious disease.  Developing a transfusion reaction. This is an allergic reaction to something in the blood you were given. Every precaution is taken to prevent this. The decision to have a blood transfusion has been considered carefully by your caregiver before blood is given. Blood is not given unless the benefits outweigh the risks. AFTER THE TRANSFUSION  Right after receiving a blood transfusion, you will usually feel much better and more energetic. This is especially true if your red blood cells have gotten low (anemic). The transfusion raises the level of the red blood cells which carry oxygen, and this usually causes an energy increase.  The nurse administering the transfusion will  monitor you carefully for complications. HOME CARE INSTRUCTIONS  No special instructions are needed after a transfusion. You may find your energy is better. Speak with your caregiver about any limitations on activity for underlying diseases you may have. SEEK MEDICAL CARE IF:   Your condition is not improving after your transfusion.  You develop redness or irritation at the intravenous (IV) site. SEEK IMMEDIATE MEDICAL CARE IF:  Any of the following symptoms occur over the next 12 hours:  Shaking chills.  You have a temperature by mouth  above 102 F (38.9 C), not controlled by medicine.  Chest, back, or muscle pain.  People around you feel you are not acting correctly or are confused.  Shortness of breath or difficulty breathing.  Dizziness and fainting.  You get a rash or develop hives.  You have a decrease in urine output.  Your urine turns a dark color or changes to pink, red, or brown. Any of the following symptoms occur over the next 10 days:  You have a temperature by mouth above 102 F (38.9 C), not controlled by medicine.  Shortness of breath.  Weakness after normal activity.  The white part of the eye turns yellow (jaundice).  You have a decrease in the amount of urine or are urinating less often.  Your urine turns a dark color or changes to pink, red, or brown. Document Released: 04/16/2000 Document Revised: 07/12/2011 Document Reviewed: 12/04/2007 [facility] Patient Information 2014 [place], [place].  _______________________________________________________________________

## 2017-07-13 ENCOUNTER — Encounter (HOSPITAL_COMMUNITY): Payer: Self-pay

## 2017-07-13 ENCOUNTER — Other Ambulatory Visit: Payer: Self-pay

## 2017-07-13 ENCOUNTER — Encounter (HOSPITAL_COMMUNITY)
Admission: RE | Admit: 2017-07-13 | Discharge: 2017-07-13 | Disposition: A | Discharge status: Home / Self Care | Payer: PPO | Source: Ambulatory Visit | Attending: Orthopedic Surgery | Admitting: Orthopedic Surgery

## 2017-07-13 DIAGNOSIS — I10 Essential (primary) hypertension: Secondary | ICD-10-CM | POA: Diagnosis not present

## 2017-07-13 DIAGNOSIS — Z01810 Encounter for preprocedural cardiovascular examination: Secondary | ICD-10-CM | POA: Insufficient documentation

## 2017-07-13 DIAGNOSIS — Z01812 Encounter for preprocedural laboratory examination: Secondary | ICD-10-CM | POA: Insufficient documentation

## 2017-07-13 HISTORY — DX: Other specified postprocedural states: R11.2

## 2017-07-13 HISTORY — DX: Adverse effect of unspecified anesthetic, initial encounter: T41.45XA

## 2017-07-13 HISTORY — DX: Nausea with vomiting, unspecified: Z98.890

## 2017-07-13 HISTORY — DX: Other complications of anesthesia, initial encounter: T88.59XA

## 2017-07-13 LAB — COMPREHENSIVE METABOLIC PANEL
ALBUMIN: 4.1 g/dL (ref 3.5–5.0)
ALK PHOS: 48 U/L (ref 38–126)
ALT: 24 U/L (ref 14–54)
AST: 28 U/L (ref 15–41)
Anion gap: 8 (ref 5–15)
BUN: 19 mg/dL (ref 6–20)
CALCIUM: 9.5 mg/dL (ref 8.9–10.3)
CHLORIDE: 98 mmol/L — AB (ref 101–111)
CO2: 29 mmol/L (ref 22–32)
CREATININE: 0.77 mg/dL (ref 0.44–1.00)
GFR calc Af Amer: >60 mL/min (ref >60)
GFR calc non Af Amer: >60 mL/min (ref >60)
GLUCOSE: 104 mg/dL — AB (ref 65–99)
Potassium: 3.9 mmol/L (ref 3.5–5.1)
SODIUM: 135 mmol/L (ref 135–145)
Total Bilirubin: 0.3 mg/dL (ref 0.3–1.2)
Total Protein: 7.1 g/dL (ref 6.5–8.1)

## 2017-07-13 LAB — CBC
HCT: 38.2 % (ref 36.0–46.0)
HEMOGLOBIN: 13.1 g/dL (ref 12.0–15.0)
MCH: 34.5 pg — ABNORMAL HIGH (ref 26.0–34.0)
MCHC: 34.3 g/dL (ref 30.0–36.0)
MCV: 100.5 fL — ABNORMAL HIGH (ref 78.0–100.0)
PLATELETS: 254 10*3/uL (ref 150–400)
RBC: 3.8 MIL/uL — AB (ref 3.87–5.11)
RDW: 13.1 % (ref 11.5–15.5)
WBC: 5.8 10*3/uL (ref 4.0–10.5)

## 2017-07-13 LAB — PROTIME-INR
INR: 0.94
Prothrombin Time: 12.5 seconds (ref 11.4–15.2)

## 2017-07-13 LAB — SURGICAL PCR SCREEN
MRSA, PCR: NEGATIVE
Staphylococcus aureus: NEGATIVE

## 2017-07-13 LAB — APTT: APTT: 25 s (ref 24–36)

## 2017-07-15 ENCOUNTER — Ambulatory Visit: Payer: Self-pay | Admitting: Orthopedic Surgery

## 2017-07-15 NOTE — H&P (View-Only) (Signed)
MAURIANA, DANN [phone], F)  DOB    [birth year]  Chief Complaint Right Knee Pain H&P Right Total Knee Arthroplasty - 07/18/2017  Patient's Care Team Primary Care Provider: [name]: [address] [name], [place] [address], Ph [phone], Fax [phone] NPI: [id number] Patient's Pharmacies [id number]): [place], [place] [address], Ph [phone], Fax [phone]   Vitals Ht:        5 ft 6 in BP - 126/68 Pulse - 56   Allergies Reviewed Allergies AUGMENTIN: Hives (Mild)    Possible Metal Allergy - Testing Pending at time of H&P   Medications Reviewed Medications amLODIPine 10 mg tablet 05/05/17   filled           RXOPTIONS diclofenac 1 % topical gel as needed 03/26/17   filled           RXOPTIONS fluconazole 150 mg tablet 06/20/17   filled           RXOPTIONS hydroCHLOROthiazide 25 mg tablet 06/11/17   filled           RXOPTIONS latanoprost 0.005 % eye drops INSTILL 1 DROP INTO EACH EYE ONCE DAILY AT BEDTIME 06/22/17   filled           RXOPTIONS meloxicam 15 mg tablet 06/17/17   filled           RXOPTIONS nystatin 100,000 unit/gram topical ointment 06/20/17   filled           RXOPTIONS rosuvastatin 10 mg tablet 01/10/17   filled           RXOPTIONS sulfamethoxazole 800 mg-trimethoprim 160 mg tablet 09/22/16   filled           RXOPTIONS timolol maleate 0.5 % eye drops 04/01/17   filled           RXOPTIONS triamcinolone acetonide 0.1 % topical ointment 06/20/17   filled           RXOPTIONS venlafaxine ER 75 mg capsule,extended release 24 hr 05/16/17   filled           RXOPTIONS                                                                                                               Problems Reviewed Problems Osteoarthritis of right knee joint   Family History Reviewed Family History Father  - Father deceased Mother - Mother deceased   Social History Reviewed Social History Smoking  Status: Former smoker Non-smoker Chewing tobacco: none Alcohol intake: Occasional Hand Dominance: Right Work related injury?: N Advance directive: Y Freight forwarder of Attorney: Y   Surgical History Reviewed Surgical History Ankle Surgery - 2001 - Left Ankle Procedure on back - 1999 - Dr. Shellia Carwin Knee Surgery - 1971 - Open Arthrotomy - Meniscal Surgery   Past Medical History Reviewed Past Medical History Blood Clots: Y - Postop Operative DVT and PE - 1971 High Cholesterol: Y Hypertension: Y Joint Pain: Y Previous Cortisone Injection(s): Y Previous Fracture(s): Y Previous  Oral Steroid(s): Y Notes: Impaired Vision,  Vertigo,  Glaucoma,  Depression,  Heart Murmur,  Hemorrhoids,  History of DVT,  Postoperative Pulmonary Embolism    HPI Patient is a 72 year old female scheduled for right total knee by Dr. [name] on 07/18/17 at [facility]. Patient reports swelling, catching/locking, and instability. She reports right knee pain greater than 5 months. She describes the pain as aching and burning. She reports moderate. Temporary benefit with ice and NSAIDs. She states that her knee pain was worse at that time as well, but has since improved. Her RIGHT knee has gotten progressively worse and is bothering her at all times. It is limiting what she can and cannot do. She is having pain with activity and at rest. She would like to be more active but cannot do so because of the knee pain. The injections did not provide much benefit at all. She has had a stage when the knee is essentially taken over her life and she is ready to go ahead and get it fixed. AP and lateral of the RIGHT knee show bone-on-bone arthritis in the medial and patellofemoral compartments with varus deformity of the knee. They have had injections and failed nonoperative management. At this point the pain and dysfunction have essentially taken over their life. The most predictable means of improving pain  and function is total knee arthroplasty. We discussed the procedure risks, potential complications and rehab course in detail and the she would like to go ahead and proceed with total knee arthroplasty.   ROS Additionally reports: Reported by Patient CONSTITUTIONAL: no Fever, no Chills, no Night Sweats, no Weight Loss  CARDIOVASCULAR: no Cough, no Shortness of Breath, no COPD, no Asthma  GASTROINTESTINAL: no Vomiting, no Nausea  MUSCULOSKELETAL: JOINT PAIN, but no Swelling in Joints  NEUROLOGIC: no Numbness, no Tingling/Paresthesias, no Difficulty with Balance  Physical Exam Patient is a 72 year old female.  General Mental Status - Alert, cooperative and good historian. General Appearance - pleasant, Not in acute distress. Orientation - Oriented X3. Build & Nutrition - Well nourished and Well developed.  Head and Neck Head - normocephalic, atraumatic . Neck Global Assessment - supple, no bruit auscultated on the right, no bruit auscultated on the left.  Eye Wears Glasses Pupil - Bilateral - PERR Motion - Bilateral - EOMI.  Chest and Lung Exam Auscultation Breath sounds - clear at anterior chest wall and clear at posterior chest wall. Adventitious sounds - No Adventitious sounds.  Cardiovascular Auscultation Rhythm - Regular rate and rhythm. Heart Sounds - S1 WNL and S2 WNL. Murmurs & Other Heart Sounds - Auscultation of the heart reveals - No Murmurs.  Abdomen Palpation/Percussion Tenderness - Abdomen is non-tender to palpation. Abdomen is soft. Auscultation Auscultation of the abdomen reveals - Bowel sounds normal.  Genitourinary Note: Not done, not pertinent to present illness  Musculoskeletal LEFT knee shows no swelling. Range of motion 0-125 with no tenderness or instability. RIGHT knee shows varus deformity. Range of motion is 10-115. There is marked crepitus on range of motion. She is tender medial greater than lateral with no instability  noted. Her gait pattern is significantly antalgic on the RIGHT. Pulses sensation and motor are intact.  Radiographs AP and lateral of the RIGHT knee show bone-on-bone arthritis in the medial and patellofemoral compartments with varus deformity of the knee.    Assessment / Plan 1. Osteoarthritis of right knee joint M17.11: Unilateral primary osteoarthritis, right knee  Patient Instructions Surgical Plans: Right Total Knee  Replacement  Disposition: Home, straight to Outpatient PCP: Dr. Raliegh Ip Topical TXA  Anesthesia Issues: Nausea with Anesthesia Patient was instructed on what medications to stop prior to surgery. - Follow up visit in 2 weeks with Dr. [name] - Begin physical therapy following surgery. First PT EVAL for Postop Right Total Knee Arthroplasty on March 22. - Pre-operative lab work as pre Pre-Surgical Testing - Prescriptions will be provided in hospital at time of discharge  Return to [place], DPT for Physical Therapy Evaluation at [facility] on 07/22/2017 at 02:00 PM Gaynelle Arabian, MD for [place] at [facility] on 08/02/2017 at 01:15 PM  Encounter signed-off by Mickel Crow, PA-C

## 2017-07-15 NOTE — H&P (Signed)
Susan Jenkins, Susan Jenkins [phone], F)  DOB    1946-01-18  Chief Complaint Right Knee Pain H&P Right Total Knee Arthroplasty - 07/18/2017  Patient's Care Team Primary Care Provider: [name]: [address] [name], [place] [address], Ph [phone], Fax [phone] NPI: [id number] Patient's Pharmacies [id number]): [place], [place] [address], Ph [phone], Fax [phone]   Vitals Ht:        5 ft 6 in BP - 126/68 Pulse - 56   Allergies Reviewed Allergies AUGMENTIN: Hives (Mild)    Possible Metal Allergy - Testing Pending at time of H&P   Medications Reviewed Medications amLODIPine 10 mg tablet 05/05/17   filled           RXOPTIONS diclofenac 1 % topical gel as needed 03/26/17   filled           RXOPTIONS fluconazole 150 mg tablet 06/20/17   filled           RXOPTIONS hydroCHLOROthiazide 25 mg tablet 06/11/17   filled           RXOPTIONS latanoprost 0.005 % eye drops INSTILL 1 DROP INTO EACH EYE ONCE DAILY AT BEDTIME 06/22/17   filled           RXOPTIONS meloxicam 15 mg tablet 06/17/17   filled           RXOPTIONS nystatin 100,000 unit/gram topical ointment 06/20/17   filled           RXOPTIONS rosuvastatin 10 mg tablet 01/10/17   filled           RXOPTIONS sulfamethoxazole 800 mg-trimethoprim 160 mg tablet 09/22/16   filled           RXOPTIONS timolol maleate 0.5 % eye drops 04/01/17   filled           RXOPTIONS triamcinolone acetonide 0.1 % topical ointment 06/20/17   filled           RXOPTIONS venlafaxine ER 75 mg capsule,extended release 24 hr 05/16/17   filled           RXOPTIONS                                                                                                               Problems Reviewed Problems Osteoarthritis of right knee joint   Family History Reviewed Family History Father  - Father deceased Mother - Mother deceased   Social History Reviewed Social History Smoking  Status: Former smoker Non-smoker Chewing tobacco: none Alcohol intake: Occasional Hand Dominance: Right Work related injury?: N Advance directive: Y Freight forwarder of Attorney: Y   Surgical History Reviewed Surgical History Ankle Surgery - 2001 - Left Ankle Procedure on back - 1999 - Dr. Shellia Carwin Knee Surgery - 1971 - Open Arthrotomy - Meniscal Surgery   Past Medical History Reviewed Past Medical History Blood Clots: Y - Postop Operative DVT and PE - 1971 High Cholesterol: Y Hypertension: Y Joint Pain: Y Previous Cortisone Injection(s): Y Previous Fracture(s): Y Previous  Oral Steroid(s): Y Notes: Impaired Vision,  Vertigo,  Glaucoma,  Depression,  Heart Murmur,  Hemorrhoids,  History of DVT,  Postoperative Pulmonary Embolism    HPI Patient is a 72 year old female scheduled for right total knee by Dr. [name] on 07/18/17 at [facility]. Patient reports swelling, catching/locking, and instability. She reports right knee pain greater than 5 months. She describes the pain as aching and burning. She reports moderate. Temporary benefit with ice and NSAIDs. She states that her knee pain was worse at that time as well, but has since improved. Her RIGHT knee has gotten progressively worse and is bothering her at all times. It is limiting what she can and cannot do. She is having pain with activity and at rest. She would like to be more active but cannot do so because of the knee pain. The injections did not provide much benefit at all. She has had a stage when the knee is essentially taken over her life and she is ready to go ahead and get it fixed. AP and lateral of the RIGHT knee show bone-on-bone arthritis in the medial and patellofemoral compartments with varus deformity of the knee. They have had injections and failed nonoperative management. At this point the pain and dysfunction have essentially taken over their life. The most predictable means of improving pain  and function is total knee arthroplasty. We discussed the procedure risks, potential complications and rehab course in detail and the she would like to go ahead and proceed with total knee arthroplasty.   ROS Additionally reports: Reported by Patient CONSTITUTIONAL: no Fever, no Chills, no Night Sweats, no Weight Loss  CARDIOVASCULAR: no Cough, no Shortness of Breath, no COPD, no Asthma  GASTROINTESTINAL: no Vomiting, no Nausea  MUSCULOSKELETAL: JOINT PAIN, but no Swelling in Joints  NEUROLOGIC: no Numbness, no Tingling/Paresthesias, no Difficulty with Balance  Physical Exam Patient is a 72 year old female.  General Mental Status - Alert, cooperative and good historian. General Appearance - pleasant, Not in acute distress. Orientation - Oriented X3. Build & Nutrition - Well nourished and Well developed.  Head and Neck Head - normocephalic, atraumatic . Neck Global Assessment - supple, no bruit auscultated on the right, no bruit auscultated on the left.  Eye Wears Glasses Pupil - Bilateral - PERR Motion - Bilateral - EOMI.  Chest and Lung Exam Auscultation Breath sounds - clear at anterior chest wall and clear at posterior chest wall. Adventitious sounds - No Adventitious sounds.  Cardiovascular Auscultation Rhythm - Regular rate and rhythm. Heart Sounds - S1 WNL and S2 WNL. Murmurs & Other Heart Sounds - Auscultation of the heart reveals - No Murmurs.  Abdomen Palpation/Percussion Tenderness - Abdomen is non-tender to palpation. Abdomen is soft. Auscultation Auscultation of the abdomen reveals - Bowel sounds normal.  Genitourinary Note: Not done, not pertinent to present illness  Musculoskeletal LEFT knee shows no swelling. Range of motion 0-125 with no tenderness or instability. RIGHT knee shows varus deformity. Range of motion is 10-115. There is marked crepitus on range of motion. She is tender medial greater than lateral with no instability  noted. Her gait pattern is significantly antalgic on the RIGHT. Pulses sensation and motor are intact.  Radiographs AP and lateral of the RIGHT knee show bone-on-bone arthritis in the medial and patellofemoral compartments with varus deformity of the knee.    Assessment / Plan 1. Osteoarthritis of right knee joint M17.11: Unilateral primary osteoarthritis, right knee  Patient Instructions Surgical Plans: Right Total Knee  Replacement  Disposition: Home, straight to Outpatient PCP: Dr. Raliegh Ip Topical TXA  Anesthesia Issues: Nausea with Anesthesia Patient was instructed on what medications to stop prior to surgery. - Follow up visit in 2 weeks with Dr. [name] - Begin physical therapy following surgery. First PT EVAL for Postop Right Total Knee Arthroplasty on March 22. - Pre-operative lab work as pre Pre-Surgical Testing - Prescriptions will be provided in hospital at time of discharge  Return to [place], DPT for Physical Therapy Evaluation at [facility] on 07/22/2017 at 02:00 PM Gaynelle Arabian, MD for [place] at [facility] on 08/02/2017 at 01:15 PM  Encounter signed-off by Mickel Crow, PA-C

## 2017-07-17 MED ORDER — TRANEXAMIC ACID 1000 MG/10ML IV SOLN
2000.0000 mg | Freq: Once | INTRAVENOUS | Status: DC
  Filled 2017-07-17: qty 20

## 2017-07-17 MED ORDER — VANCOMYCIN HCL 10 G IV SOLR
1500.0000 mg | INTRAVENOUS | Status: AC
  Administered 2017-07-18: 1500 mg via INTRAVENOUS
  Filled 2017-07-17: qty 1500

## 2017-07-17 MED ORDER — BUPIVACAINE LIPOSOME 1.3 % IJ SUSP
20.0000 mL | Freq: Once | INTRAMUSCULAR | Status: DC
  Filled 2017-07-17: qty 20

## 2017-07-18 ENCOUNTER — Inpatient Hospital Stay (HOSPITAL_COMMUNITY): Payer: PPO | Admitting: Registered Nurse

## 2017-07-18 ENCOUNTER — Other Ambulatory Visit: Payer: Self-pay

## 2017-07-18 ENCOUNTER — Encounter (HOSPITAL_COMMUNITY)
Admission: RE | Disposition: A | Discharge status: Home / Self Care | Payer: Self-pay | Source: Ambulatory Visit | Attending: Orthopedic Surgery

## 2017-07-18 ENCOUNTER — Encounter (HOSPITAL_COMMUNITY): Payer: Self-pay | Admitting: Emergency Medicine

## 2017-07-18 ENCOUNTER — Inpatient Hospital Stay (HOSPITAL_COMMUNITY)
Admission: RE | Admit: 2017-07-18 | Discharge: 2017-07-21 | DRG: 470 | Disposition: A | Discharge status: Home / Self Care | Payer: PPO | Source: Ambulatory Visit | Attending: Orthopedic Surgery | Admitting: Orthopedic Surgery

## 2017-07-18 DIAGNOSIS — I421 Obstructive hypertrophic cardiomyopathy: Secondary | ICD-10-CM | POA: Diagnosis not present

## 2017-07-18 DIAGNOSIS — M179 Osteoarthritis of knee, unspecified: Secondary | ICD-10-CM | POA: Diagnosis present

## 2017-07-18 DIAGNOSIS — Z79899 Other long term (current) drug therapy: Secondary | ICD-10-CM | POA: Diagnosis not present

## 2017-07-18 DIAGNOSIS — I10 Essential (primary) hypertension: Secondary | ICD-10-CM | POA: Diagnosis not present

## 2017-07-18 DIAGNOSIS — E7800 Pure hypercholesterolemia, unspecified: Secondary | ICD-10-CM | POA: Diagnosis not present

## 2017-07-18 DIAGNOSIS — H409 Unspecified glaucoma: Secondary | ICD-10-CM | POA: Diagnosis not present

## 2017-07-18 DIAGNOSIS — Z86711 Personal history of pulmonary embolism: Secondary | ICD-10-CM

## 2017-07-18 DIAGNOSIS — G8918 Other acute postprocedural pain: Secondary | ICD-10-CM | POA: Diagnosis not present

## 2017-07-18 DIAGNOSIS — M1711 Unilateral primary osteoarthritis, right knee: Secondary | ICD-10-CM | POA: Diagnosis not present

## 2017-07-18 DIAGNOSIS — M1710 Unilateral primary osteoarthritis, unspecified knee: Secondary | ICD-10-CM | POA: Diagnosis present

## 2017-07-18 DIAGNOSIS — Z87891 Personal history of nicotine dependence: Secondary | ICD-10-CM | POA: Diagnosis not present

## 2017-07-18 DIAGNOSIS — Z86718 Personal history of other venous thrombosis and embolism: Secondary | ICD-10-CM

## 2017-07-18 DIAGNOSIS — M25561 Pain in right knee: Secondary | ICD-10-CM | POA: Diagnosis present

## 2017-07-18 DIAGNOSIS — E785 Hyperlipidemia, unspecified: Secondary | ICD-10-CM | POA: Diagnosis not present

## 2017-07-18 HISTORY — PX: TOTAL KNEE ARTHROPLASTY: SHX125

## 2017-07-18 LAB — TYPE AND SCREEN
ABO/RH(D): A POS
Antibody Screen: NEGATIVE

## 2017-07-18 SURGERY — ARTHROPLASTY, KNEE, TOTAL
Anesthesia: Regional | Site: Knee | Laterality: Right

## 2017-07-18 MED ORDER — METOCLOPRAMIDE HCL 5 MG PO TABS: 5.0000 mg | ORAL_TABLET | Freq: Three times a day (TID) | ORAL | Status: DC | PRN

## 2017-07-18 MED ORDER — MENTHOL 3 MG MT LOZG: 1.0000 | LOZENGE | OROMUCOSAL | Status: DC | PRN

## 2017-07-18 MED ORDER — DEXAMETHASONE SODIUM PHOSPHATE 10 MG/ML IJ SOLN
10.0000 mg | Freq: Once | INTRAMUSCULAR | Status: AC
  Administered 2017-07-18: 10 mg via INTRAVENOUS

## 2017-07-18 MED ORDER — FLEET ENEMA 7-19 GM/118ML RE ENEM: 1.0000 | ENEMA | Freq: Once | RECTAL | Status: DC | PRN

## 2017-07-18 MED ORDER — PROPOFOL 10 MG/ML IV BOLUS
INTRAVENOUS | Status: AC
  Filled 2017-07-18: qty 60

## 2017-07-18 MED ORDER — SODIUM CHLORIDE 0.9 % IV SOLN
INTRAVENOUS | Status: DC
  Administered 2017-07-18: 16:00:00 via INTRAVENOUS

## 2017-07-18 MED ORDER — PROPOFOL 10 MG/ML IV BOLUS
INTRAVENOUS | Status: DC | PRN
  Administered 2017-07-18: 20 mg via INTRAVENOUS
  Administered 2017-07-18: 30 mg via INTRAVENOUS

## 2017-07-18 MED ORDER — SODIUM CHLORIDE 0.9 % IJ SOLN
INTRAMUSCULAR | Status: DC | PRN
  Administered 2017-07-18: 60 mL

## 2017-07-18 MED ORDER — POLYETHYLENE GLYCOL 3350 17 G PO PACK
17.0000 g | PACK | Freq: Every day | ORAL | Status: DC | PRN
  Filled 2017-07-18: qty 1

## 2017-07-18 MED ORDER — OXYCODONE HCL 5 MG PO TABS
5.0000 mg | ORAL_TABLET | ORAL | Status: DC | PRN
  Administered 2017-07-18: 22:00:00 10 mg via ORAL
  Filled 2017-07-18: qty 2

## 2017-07-18 MED ORDER — LATANOPROST 0.005 % OP SOLN
1.0000 [drp] | Freq: Every day | OPHTHALMIC | Status: DC
  Administered 2017-07-18 – 2017-07-20 (×3): 1 [drp] via OPHTHALMIC
  Filled 2017-07-18: qty 2.5

## 2017-07-18 MED ORDER — DIPHENHYDRAMINE HCL 12.5 MG/5ML PO ELIX: 12.5000 mg | ORAL_SOLUTION | ORAL | Status: DC | PRN

## 2017-07-18 MED ORDER — TRANEXAMIC ACID 1000 MG/10ML IV SOLN
INTRAVENOUS | Status: AC | PRN
  Administered 2017-07-18: 2000 mg via TOPICAL

## 2017-07-18 MED ORDER — VANCOMYCIN HCL IN DEXTROSE 1-5 GM/200ML-% IV SOLN
1000.0000 mg | Freq: Two times a day (BID) | INTRAVENOUS | Status: AC
  Administered 2017-07-18: 1000 mg via INTRAVENOUS
  Filled 2017-07-18: qty 200

## 2017-07-18 MED ORDER — PROPOFOL 10 MG/ML IV BOLUS
INTRAVENOUS | Status: AC
  Filled 2017-07-18: qty 20

## 2017-07-18 MED ORDER — ONDANSETRON HCL 4 MG/2ML IJ SOLN: 4.0000 mg | Freq: Four times a day (QID) | INTRAMUSCULAR | Status: DC | PRN

## 2017-07-18 MED ORDER — AMLODIPINE BESYLATE 10 MG PO TABS
10.0000 mg | ORAL_TABLET | Freq: Every day | ORAL | Status: DC
  Administered 2017-07-19 – 2017-07-21 (×3): 10 mg via ORAL
  Filled 2017-07-18 (×3): qty 1

## 2017-07-18 MED ORDER — OXYCODONE HCL 5 MG PO TABS
10.0000 mg | ORAL_TABLET | ORAL | Status: DC | PRN
  Administered 2017-07-19 – 2017-07-20 (×5): 15 mg via ORAL
  Filled 2017-07-18 (×5): qty 3

## 2017-07-18 MED ORDER — VENLAFAXINE HCL ER 75 MG PO CP24
75.0000 mg | ORAL_CAPSULE | Freq: Every day | ORAL | Status: DC
  Administered 2017-07-18 – 2017-07-20 (×4): 75 mg via ORAL
  Filled 2017-07-18 (×5): qty 1

## 2017-07-18 MED ORDER — ONDANSETRON HCL 4 MG/2ML IJ SOLN
INTRAMUSCULAR | Status: AC
  Filled 2017-07-18: qty 2

## 2017-07-18 MED ORDER — SODIUM CHLORIDE 0.9 % IJ SOLN
INTRAMUSCULAR | Status: AC
  Filled 2017-07-18: qty 50

## 2017-07-18 MED ORDER — FENTANYL CITRATE (PF) 100 MCG/2ML IJ SOLN
100.0000 ug | Freq: Once | INTRAMUSCULAR | Status: AC
  Administered 2017-07-18: 100 ug via INTRAVENOUS
  Filled 2017-07-18: qty 2

## 2017-07-18 MED ORDER — PROPOFOL 500 MG/50ML IV EMUL
INTRAVENOUS | Status: DC | PRN
  Administered 2017-07-18: 120 ug/kg/min via INTRAVENOUS

## 2017-07-18 MED ORDER — CHLORHEXIDINE GLUCONATE 4 % EX LIQD: 60.0000 mL | Freq: Once | CUTANEOUS | Status: DC

## 2017-07-18 MED ORDER — DEXAMETHASONE SODIUM PHOSPHATE 10 MG/ML IJ SOLN
INTRAMUSCULAR | Status: AC
  Filled 2017-07-18: qty 1

## 2017-07-18 MED ORDER — ONDANSETRON HCL 4 MG PO TABS: 4.0000 mg | ORAL_TABLET | Freq: Four times a day (QID) | ORAL | Status: DC | PRN

## 2017-07-18 MED ORDER — ACETAMINOPHEN 500 MG PO TABS
1000.0000 mg | ORAL_TABLET | Freq: Four times a day (QID) | ORAL | Status: AC
  Administered 2017-07-18 – 2017-07-19 (×3): 1000 mg via ORAL
  Filled 2017-07-18 (×4): qty 2

## 2017-07-18 MED ORDER — BUPIVACAINE IN DEXTROSE 0.75-8.25 % IT SOLN
INTRATHECAL | Status: DC | PRN
  Administered 2017-07-18: 2 mL via INTRATHECAL

## 2017-07-18 MED ORDER — ROSUVASTATIN CALCIUM 10 MG PO TABS
10.0000 mg | ORAL_TABLET | Freq: Every day | ORAL | Status: DC
  Administered 2017-07-19 – 2017-07-21 (×3): 10 mg via ORAL
  Filled 2017-07-18 (×3): qty 1

## 2017-07-18 MED ORDER — SODIUM CHLORIDE 0.9 % IJ SOLN
INTRAMUSCULAR | Status: AC
  Filled 2017-07-18: qty 10

## 2017-07-18 MED ORDER — BISACODYL 10 MG RE SUPP: 10.0000 mg | Freq: Every day | RECTAL | Status: DC | PRN

## 2017-07-18 MED ORDER — DEXAMETHASONE SODIUM PHOSPHATE 10 MG/ML IJ SOLN
10.0000 mg | Freq: Once | INTRAMUSCULAR | Status: AC
  Administered 2017-07-19: 11:00:00 10 mg via INTRAVENOUS
  Filled 2017-07-18: qty 1

## 2017-07-18 MED ORDER — METHOCARBAMOL 500 MG PO TABS
500.0000 mg | ORAL_TABLET | Freq: Four times a day (QID) | ORAL | Status: DC | PRN
  Administered 2017-07-19 – 2017-07-20 (×7): 500 mg via ORAL
  Filled 2017-07-18 (×7): qty 1

## 2017-07-18 MED ORDER — DOCUSATE SODIUM 100 MG PO CAPS
100.0000 mg | ORAL_CAPSULE | Freq: Two times a day (BID) | ORAL | Status: DC
  Administered 2017-07-18 – 2017-07-21 (×6): 100 mg via ORAL
  Filled 2017-07-18 (×6): qty 1

## 2017-07-18 MED ORDER — HYDROMORPHONE HCL 1 MG/ML IJ SOLN
0.5000 mg | INTRAMUSCULAR | Status: DC | PRN
  Administered 2017-07-18 – 2017-07-19 (×4): 1 mg via INTRAVENOUS
  Filled 2017-07-18 (×4): qty 1

## 2017-07-18 MED ORDER — TIMOLOL MALEATE 0.5 % OP SOLN
1.0000 [drp] | Freq: Two times a day (BID) | OPHTHALMIC | Status: DC
  Administered 2017-07-18 – 2017-07-21 (×6): 1 [drp] via OPHTHALMIC
  Filled 2017-07-18: qty 5

## 2017-07-18 MED ORDER — GABAPENTIN 300 MG PO CAPS
300.0000 mg | ORAL_CAPSULE | Freq: Once | ORAL | Status: AC
  Administered 2017-07-18: 300 mg via ORAL
  Filled 2017-07-18: qty 1

## 2017-07-18 MED ORDER — BUPIVACAINE LIPOSOME 1.3 % IJ SUSP
INTRAMUSCULAR | Status: DC | PRN
  Administered 2017-07-18: 20 mL

## 2017-07-18 MED ORDER — RIVAROXABAN 10 MG PO TABS
10.0000 mg | ORAL_TABLET | Freq: Every day | ORAL | Status: DC
  Administered 2017-07-19 – 2017-07-21 (×3): 10 mg via ORAL
  Filled 2017-07-18 (×3): qty 1

## 2017-07-18 MED ORDER — HYDROCHLOROTHIAZIDE 25 MG PO TABS
25.0000 mg | ORAL_TABLET | Freq: Every day | ORAL | Status: DC
  Administered 2017-07-19 – 2017-07-21 (×3): 25 mg via ORAL
  Filled 2017-07-18 (×3): qty 1

## 2017-07-18 MED ORDER — PHENOL 1.4 % MT LIQD: 1.0000 | OROMUCOSAL | Status: DC | PRN

## 2017-07-18 MED ORDER — MIDAZOLAM HCL 2 MG/2ML IJ SOLN
2.0000 mg | Freq: Once | INTRAMUSCULAR | Status: AC
  Administered 2017-07-18: 2 mg via INTRAVENOUS
  Filled 2017-07-18: qty 2

## 2017-07-18 MED ORDER — METHOCARBAMOL 1000 MG/10ML IJ SOLN
500.0000 mg | Freq: Four times a day (QID) | INTRAMUSCULAR | Status: DC | PRN
  Administered 2017-07-18: 16:00:00 500 mg via INTRAVENOUS
  Filled 2017-07-18: qty 550

## 2017-07-18 MED ORDER — SODIUM CHLORIDE 0.9 % IR SOLN
Status: DC | PRN
  Administered 2017-07-18: 1000 mL

## 2017-07-18 MED ORDER — ONDANSETRON HCL 4 MG/2ML IJ SOLN
INTRAMUSCULAR | Status: DC | PRN
  Administered 2017-07-18: 4 mg via INTRAVENOUS

## 2017-07-18 MED ORDER — LACTATED RINGERS IV SOLN
INTRAVENOUS | Status: DC
  Administered 2017-07-18 (×2): via INTRAVENOUS

## 2017-07-18 MED ORDER — METOCLOPRAMIDE HCL 5 MG/ML IJ SOLN: 5.0000 mg | Freq: Three times a day (TID) | INTRAMUSCULAR | Status: DC | PRN

## 2017-07-18 MED ORDER — ACETAMINOPHEN 10 MG/ML IV SOLN
1000.0000 mg | Freq: Once | INTRAVENOUS | Status: AC
  Administered 2017-07-18: 1000 mg via INTRAVENOUS
  Filled 2017-07-18: qty 100

## 2017-07-18 MED ORDER — HYDROMORPHONE HCL 1 MG/ML IJ SOLN: 0.2500 mg | INTRAMUSCULAR | Status: DC | PRN

## 2017-07-18 SURGICAL SUPPLY — 51 items
BAG DECANTER FOR FLEXI CONT (MISCELLANEOUS) ×3 IMPLANT
BAG ZIPLOCK 12X15 (MISCELLANEOUS) ×3 IMPLANT
BANDAGE ACE 6X5 VEL STRL LF (GAUZE/BANDAGES/DRESSINGS) ×3 IMPLANT
BLADE SAG 18X100X1.27 (BLADE) ×3 IMPLANT
BLADE SAW SGTL 11.0X1.19X90.0M (BLADE) ×3 IMPLANT
BOWL SMART MIX CTS (DISPOSABLE) ×3 IMPLANT
CAP KNEE TOTAL 3 ×3 IMPLANT
CEMENT HV SMART SET (Cement) ×6 IMPLANT
CLOSURE WOUND 1/2 X4 (GAUZE/BANDAGES/DRESSINGS) ×1
COVER SURGICAL LIGHT HANDLE (MISCELLANEOUS) ×3 IMPLANT
CUFF TOURN SGL QUICK 34 (TOURNIQUET CUFF) ×2
CUFF TRNQT CYL 34X4X40X1 (TOURNIQUET CUFF) ×1 IMPLANT
DECANTER SPIKE VIAL GLASS SM (MISCELLANEOUS) ×3 IMPLANT
DRAPE TOP 10253 STERILE (DRAPES) IMPLANT
DRAPE U-SHAPE 47X51 STRL (DRAPES) ×3 IMPLANT
DRSG ADAPTIC 3X8 NADH LF (GAUZE/BANDAGES/DRESSINGS) ×3 IMPLANT
DRSG PAD ABDOMINAL 8X10 ST (GAUZE/BANDAGES/DRESSINGS) ×3 IMPLANT
DURAPREP 26ML APPLICATOR (WOUND CARE) ×3 IMPLANT
ELECT REM PT RETURN 15FT ADLT (MISCELLANEOUS) ×3 IMPLANT
EVACUATOR 1/8 PVC DRAIN (DRAIN) ×3 IMPLANT
GAUZE SPONGE 4X4 12PLY STRL (GAUZE/BANDAGES/DRESSINGS) ×3 IMPLANT
GLOVE BIO SURGEON STRL SZ7.5 (GLOVE) IMPLANT
GLOVE BIO SURGEON STRL SZ8 (GLOVE) ×3 IMPLANT
GLOVE BIOGEL PI IND STRL 6.5 (GLOVE) IMPLANT
GLOVE BIOGEL PI IND STRL 8 (GLOVE) ×1 IMPLANT
GLOVE BIOGEL PI INDICATOR 6.5 (GLOVE)
GLOVE BIOGEL PI INDICATOR 8 (GLOVE) ×2
GLOVE SURG SS PI 6.5 STRL IVOR (GLOVE) IMPLANT
GOWN STRL REUS W/TWL LRG LVL3 (GOWN DISPOSABLE) ×3 IMPLANT
GOWN STRL REUS W/TWL XL LVL3 (GOWN DISPOSABLE) IMPLANT
HANDPIECE INTERPULSE COAX TIP (DISPOSABLE) ×2
IMMOBILIZER KNEE 20 (SOFTGOODS) ×3
IMMOBILIZER KNEE 20 THIGH 36 (SOFTGOODS) ×1 IMPLANT
MANIFOLD NEPTUNE II (INSTRUMENTS) ×3 IMPLANT
NS IRRIG 1000ML POUR BTL (IV SOLUTION) ×3 IMPLANT
PACK TOTAL KNEE CUSTOM (KITS) ×3 IMPLANT
PAD ABD 8X10 STRL (GAUZE/BANDAGES/DRESSINGS) ×3 IMPLANT
PADDING CAST COTTON 6X4 STRL (CAST SUPPLIES) ×6 IMPLANT
POSITIONER SURGICAL ARM (MISCELLANEOUS) ×3 IMPLANT
SET HNDPC FAN SPRY TIP SCT (DISPOSABLE) ×1 IMPLANT
STRIP CLOSURE SKIN 1/2X4 (GAUZE/BANDAGES/DRESSINGS) ×2 IMPLANT
SUT MNCRL AB 4-0 PS2 18 (SUTURE) ×3 IMPLANT
SUT STRATAFIX 0 PDS 27 VIOLET (SUTURE) ×3
SUT VIC AB 2-0 CT1 27 (SUTURE) ×8
SUT VIC AB 2-0 CT1 TAPERPNT 27 (SUTURE) ×4 IMPLANT
SUTURE STRATFX 0 PDS 27 VIOLET (SUTURE) ×1 IMPLANT
SYR 30ML LL (SYRINGE) ×6 IMPLANT
TRAY FOLEY W/METER SILVER 16FR (SET/KITS/TRAYS/PACK) ×3 IMPLANT
WATER STERILE IRR 1000ML POUR (IV SOLUTION) ×6 IMPLANT
WRAP KNEE MAXI GEL POST OP (GAUZE/BANDAGES/DRESSINGS) ×3 IMPLANT
YANKAUER SUCT BULB TIP 10FT TU (MISCELLANEOUS) ×3 IMPLANT

## 2017-07-18 NOTE — Anesthesia Procedure Notes (Signed)
Anesthesia Regional Block: Adductor canal block   Pre-Anesthetic Checklist: ,, timeout performed, Correct Patient, Correct Site, Correct Laterality, Correct Procedure, Correct Position, site marked, Risks and benefits discussed,  Surgical consent,  Pre-op evaluation,  At surgeon's request and post-op pain management  Laterality: Right  Prep: chloraprep       Needles:   Needle Type: Stimulator Needle - 80          Additional Needles:   Procedures: Doppler guided,,,, ultrasound used (permanent image in chart),,,,  Narrative:  Start time: 07/18/2017 12:25 PM End time: 07/18/2017 12:40 PM Injection made incrementally with aspirations every 5 mL.  Performed by: Personally  Anesthesiologist: Belinda Block, MD

## 2017-07-18 NOTE — Interval H&P Note (Signed)
History and Physical Interval Note:  07/18/2017 10:38 AM  Susan Jenkins  has presented today for surgery, with the diagnosis of Right knee osteoarthritis  The various methods of treatment have been discussed with the patient and family. After consideration of risks, benefits and other options for treatment, the patient has consented to  Procedure(s): RIGHT TOTAL KNEE ARTHROPLASTY (Right) as a surgical intervention .  The patient's history has been reviewed, patient examined, no change in status, stable for surgery.  I have reviewed the patient's chart and labs.  Questions were answered to the patient's satisfaction.     Pilar Plate Laynee Lockamy

## 2017-07-18 NOTE — Anesthesia Procedure Notes (Signed)
Date/Time: 07/18/2017 11:57 AM Performed by: Sharlette Dense, CRNA Oxygen Delivery Method: Simple face mask

## 2017-07-18 NOTE — Anesthesia Preprocedure Evaluation (Signed)
Anesthesia Evaluation  Patient identified by MRN, date of birth, ID band Patient awake    Reviewed: Allergy & Precautions, NPO status , Patient's Chart, lab work & pertinent test results  History of Anesthesia Complications (+) PONV  Airway Mallampati: II  TM Distance: >3 FB     Dental   Pulmonary former smoker,    breath sounds clear to auscultation       Cardiovascular hypertension,  Rhythm:Regular Rate:Normal     Neuro/Psych  Neuromuscular disease    GI/Hepatic negative GI ROS, Neg liver ROS,   Endo/Other  negative endocrine ROS  Renal/GU negative Renal ROS     Musculoskeletal  (+) Arthritis ,   Abdominal   Peds  Hematology   Anesthesia Other Findings   Reproductive/Obstetrics                             Anesthesia Physical Anesthesia Plan  ASA: III  Anesthesia Plan: Spinal and Regional   Post-op Pain Management:    Induction:   PONV Risk Score and Plan: 3 and Treatment may vary due to age or medical condition, Ondansetron, Dexamethasone and Midazolam  Airway Management Planned: Nasal Cannula and Simple Face Mask  Additional Equipment:   Intra-op Plan:   Post-operative Plan:   Informed Consent: I have reviewed the patients History and Physical, chart, labs and discussed the procedure including the risks, benefits and alternatives for the proposed anesthesia with the patient or authorized representative who has indicated his/her understanding and acceptance.   Dental advisory given  Plan Discussed with: CRNA and Anesthesiologist  Anesthesia Plan Comments:         Anesthesia Quick Evaluation

## 2017-07-18 NOTE — Op Note (Signed)
OPERATIVE REPORT-TOTAL KNEE ARTHROPLASTY   Pre-operative diagnosis- Osteoarthritis  Right knee(s)  Post-operative diagnosis- Osteoarthritis Right knee(s)  Procedure-  Right  Total Knee Arthroplasty (Aesculap titanium knee)  Surgeon- Dione Plover. Camille Thau, MD  Assistant- Ardeen Jourdain, PA-C   Anesthesia-  Adductor canal block and spinal  EBL-25 ml   Drains Hemovac  Tourniquet time- 47 minutes @ 132 mm Hg  Complications- None  Condition-PACU - hemodynamically stable.   Brief Clinical Note  Susan Jenkins is a 72 y.o. year old female with end stage OA of her right knee with progressively worsening pain and dysfunction. She has constant pain, with activity and at rest and significant functional deficits with difficulties even with ADLs. She has had extensive non-op management including analgesics, injections of cortisone and viscosupplements, and home exercise program, but remains in significant pain with significant dysfunction.Radiographs show bone on bone arthritis medial and patellofemoral. She presents now for right Total Knee Arthroplasty.    Procedure in detail---   The patient is brought into the operating room and positioned supine on the operating table. After successful administration of  Adductor canal block and spinal,   a tourniquet is placed high on the  Right thigh(s) and the lower extremity is prepped and draped in the usual sterile fashion. Time out is performed by the operating team and then the  Right lower extremity is wrapped in Esmarch, knee flexed and the tourniquet inflated to 300 mmHg.       A midline incision is made with a ten blade through the subcutaneous tissue to the level of the extensor mechanism. A fresh blade is used to make a medial parapatellar arthrotomy. Soft tissue over the proximal medial tibia is subperiosteally elevated to the joint line with a knife and into the semimembranosus bursa with a Cobb elevator. Soft tissue over the proximal lateral  tibia is elevated with attention being paid to avoiding the patellar tendon on the tibial tubercle. The patella is everted, knee flexed 90 degrees and the ACL and PCL are removed. Findings are bone on bone all 3 compartments with large global osteophytes.        The drill is used to create a starting hole in the distal femur and the canal is thoroughly irrigated with sterile saline to remove the fatty contents. The 5 degree Right  valgus alignment guide is placed into the femoral canal and the distal femoral cutting block is pinned to remove 10 mm off the distal femur. Resection is made with an oscillating saw.      The tibia is subluxed forward and the menisci are removed. The extramedullary alignment guide is placed referencing proximally at the medial aspect of the tibial tubercle and distally along the second metatarsal axis and tibial crest. The block is pinned to remove 76mm off the more deficient medial  side. Resection is made with an oscillating saw. Size 3is the most appropriate size for the tibia and the proximal tibia is prepared with the modular drill and keel punch for that size.      The femoral sizing guide is placed and size 5 is most appropriate. Rotation is marked off the epicondylar axis and confirmed by creating a rectangular flexion gap at 90 degrees. The size 5 cutting block is pinned in this rotation and the anterior, posterior and chamfer cuts are made with the oscillating saw. The intercondylar block is then placed and that cut is made.      Trial size 3 tibial component, trial size  5 narrow posterior stabilized femur and a 12  mm posterior stabilized fixed bearing insert trial is placed. Full extension is achieved with excellent varus/valgus and anterior/posterior balance throughout full range of motion. The patella is everted and thickness measured to be 22  mm. Free hand resection is taken to 12 mm, a 3 template is placed, lug holes are drilled, trial patella is placed, and it tracks  normally. Osteophytes are removed off the posterior femur with the trial in place. All trials are removed and the cut bone surfaces prepared with pulsatile lavage. Cement is mixed and once ready for implantation, the size 3 tibial implant, size  5 narrow posterior stabilized femoral component, and the size 3 patella are cemented in place and the patella is held with the clamp. The trial insert is placed and the knee held in full extension. The Exparel (20 ml mixed with 60 ml saline) is injected into the extensor mechanism, posterior capsule, medial and lateral gutters and subcutaneous tissues.  All extruded cement is removed and once the cement is hard the permanent 12 mm posterior stabilized fixed bearing; insert is placed into the tibial tray.      The wound is copiously irrigated with saline solution and the extensor mechanism closed over a hemovac drain with #1 V-loc suture. The tourniquet is released for a total tourniquet time of 47  minutes. Flexion against gravity is 140 degrees and the patella tracks normally. Subcutaneous tissue is closed with 2.0 vicryl and subcuticular with running 4.0 Monocryl. The incision is cleaned and dried and steri-strips and a bulky sterile dressing are applied. The limb is placed into a knee immobilizer and the patient is awakened and transported to recovery in stable condition.      Please note that a surgical assistant was a medical necessity for this procedure in order to perform it in a safe and expeditious manner. Surgical assistant was necessary to retract the ligaments and vital neurovascular structures to prevent injury to them and also necessary for proper positioning of the limb to allow for anatomic placement of the prosthesis.   Dione Plover Maleta Pacha, MD    07/18/2017, 1:17 PM

## 2017-07-18 NOTE — Anesthesia Procedure Notes (Signed)
Spinal  Patient location during procedure: OR Start time: 07/18/2017 12:00 PM End time: 07/18/2017 12:05 PM Staffing Resident/CRNA: Sharlette Dense, CRNA Performed: resident/CRNA  Preanesthetic Checklist Completed: patient identified, site marked, surgical consent, pre-op evaluation, timeout performed, IV checked, risks and benefits discussed and monitors and equipment checked Spinal Block Patient position: sitting Prep: Betadine Patient monitoring: heart rate, continuous pulse ox and blood pressure Approach: midline Location: L3-4 Injection technique: single-shot Needle Needle type: Sprotte  Needle gauge: 24 G Needle length: 9 cm Additional Notes Kit expiration 10/31/2018 and lot # [id number] Clear free flow CSF, negative heme, negative paresthesia Tolerated well and returned to supine position

## 2017-07-18 NOTE — Anesthesia Postprocedure Evaluation (Signed)
Anesthesia Post Note  Patient: Susan Jenkins  Procedure(s) Performed: RIGHT TOTAL KNEE ARTHROPLASTY (Right Knee)     Patient location during evaluation: PACU Anesthesia Type: Regional and Spinal Level of consciousness: awake Pain management: pain level controlled Vital Signs Assessment: post-procedure vital signs reviewed and stable Respiratory status: spontaneous breathing Cardiovascular status: stable Anesthetic complications: no    Last Vitals:  Vitals:   07/18/17 1141 07/18/17 1145  BP: (!) 132/59 (!) 135/59  Pulse: (!) 50 (!) 51  Resp: 18 12  Temp:    SpO2: 99% 97%    Last Pain:  Vitals:   07/18/17 1104  TempSrc:   PainSc: 0-No pain                 Lazaro Isenhower

## 2017-07-18 NOTE — Transfer of Care (Signed)
Immediate Anesthesia Transfer of Care Note  Patient: Elveria Rising  Procedure(s) Performed: RIGHT TOTAL KNEE ARTHROPLASTY (Right Knee)  Patient Location: PACU  Anesthesia Type:Spinal  Level of Consciousness: awake, alert  and oriented  Airway & Oxygen Therapy: Patient Spontanous Breathing and Patient connected to face mask oxygen  Post-op Assessment: Report given to RN and Post -op Vital signs reviewed and stable  Post vital signs: Reviewed and stable  Last Vitals:  Vitals:   07/18/17 1141 07/18/17 1145  BP: (!) 132/59 (!) 135/59  Pulse: (!) 50 (!) 51  Resp: 18 12  Temp:    SpO2: 99% 97%    Last Pain:  Vitals:   07/18/17 1104  TempSrc:   PainSc: 0-No pain      Patients Stated Pain Goal: 4 (93/26/71 2458)  Complications: No apparent anesthesia complications

## 2017-07-18 NOTE — Progress Notes (Signed)
AssistedDr. Edwards with right, ultrasound guided, adductor canal block. Side rails up, monitors on throughout procedure. See vital signs in flow sheet. Tolerated Procedure well.  

## 2017-07-19 LAB — CBC
HCT: 34.8 % — ABNORMAL LOW (ref 36.0–46.0)
Hemoglobin: 11.8 g/dL — ABNORMAL LOW (ref 12.0–15.0)
MCH: 34.2 pg — AB (ref 26.0–34.0)
MCHC: 33.9 g/dL (ref 30.0–36.0)
MCV: 100.9 fL — ABNORMAL HIGH (ref 78.0–100.0)
PLATELETS: 242 10*3/uL (ref 150–400)
RBC: 3.45 MIL/uL — AB (ref 3.87–5.11)
RDW: 12.8 % (ref 11.5–15.5)
WBC: 15.5 10*3/uL — ABNORMAL HIGH (ref 4.0–10.5)

## 2017-07-19 LAB — BASIC METABOLIC PANEL
Anion gap: 9 (ref 5–15)
BUN: 15 mg/dL (ref 6–20)
CO2: 27 mmol/L (ref 22–32)
Calcium: 9 mg/dL (ref 8.9–10.3)
Chloride: 99 mmol/L — ABNORMAL LOW (ref 101–111)
Creatinine, Ser: 0.7 mg/dL (ref 0.44–1.00)
GFR calc Af Amer: >60 mL/min (ref >60)
Glucose, Bld: 119 mg/dL — ABNORMAL HIGH (ref 65–99)
POTASSIUM: 3.9 mmol/L (ref 3.5–5.1)
Sodium: 135 mmol/L (ref 135–145)

## 2017-07-19 MED ORDER — METHOCARBAMOL 500 MG PO TABS: 500.0000 mg | ORAL_TABLET | Freq: Four times a day (QID) | ORAL | 0 refills | Status: DC | PRN

## 2017-07-19 MED ORDER — ACETAMINOPHEN 500 MG PO TABS
1000.0000 mg | ORAL_TABLET | Freq: Four times a day (QID) | ORAL | Status: DC | PRN
  Administered 2017-07-20 (×2): 1000 mg via ORAL
  Filled 2017-07-19 (×2): qty 2

## 2017-07-19 MED ORDER — OXYCODONE HCL 5 MG PO TABS: 5.0000 mg | ORAL_TABLET | ORAL | 0 refills | Status: DC | PRN

## 2017-07-19 MED ORDER — RIVAROXABAN 10 MG PO TABS: 10.0000 mg | ORAL_TABLET | Freq: Every day | ORAL | 0 refills | Status: DC

## 2017-07-19 NOTE — Progress Notes (Signed)
   Subjective: 1 Day Post-Op Procedure(s) (LRB): RIGHT TOTAL KNEE ARTHROPLASTY (Right) Patient reports pain as mild.   Patient seen in rounds for Dr. [name]. Family in room. Patient is well, but has had some minor complaints of pain in the knee, requiring pain medications We will start therapy today.  Plan is to go Home after hospital stay.  Objective: Vital signs in last 24 hours: Temp:  [97.7 F (36.5 C)-99.3 F (37.4 C)] 99.3 F (37.4 C) (03/19 2056) Pulse Rate:  [46-69] 62 (03/19 2056) Resp:  [13-17] 17 (03/19 2056) BP: (135-153)/(49-63) 153/51 (03/19 2056) SpO2:  [93 %-99 %] 93 % (03/19 2056)  Intake/Output from previous day:  Intake/Output Summary (Last 24 hours) at 07/19/2017 2154 Last data filed at 07/19/2017 1900 Gross per 24 hour  Intake 3457.5 ml  Output 2295 ml  Net 1162.5 ml    Intake/Output this shift: No intake/output data recorded.  Labs: Recent Labs    07/19/17 0605  HGB 11.8*   Recent Labs    07/19/17 0605  WBC 15.5*  RBC 3.45*  HCT 34.8*  PLT 242   Recent Labs    07/19/17 0605  NA 135  K 3.9  CL 99*  CO2 27  BUN 15  CREATININE 0.70  GLUCOSE 119*  CALCIUM 9.0   No results for input(s): LABPT, INR in the last 72 hours.  EXAM General - Patient is Alert, Appropriate and Oriented Extremity - Neurovascular intact Sensation intact distally Intact pulses distally Dorsiflexion/Plantar flexion intact Dressing - dressing C/D/I Motor Function - intact, moving foot and toes well on exam.  Hemovac pulled without difficulty.  Past Medical History:  Diagnosis Date  . Complication of anesthesia   . Depression   . Glaucoma   . Hemorrhoids   . HOCM (hypertrophic obstructive cardiomyopathy) ([place])   . Hyperlipidemia   . Hypertension   . PONV (postoperative nausea and vomiting)   . Pulmonary embolus ([place]) 1971   following surgery  . Vitamin D deficiency     Assessment/Plan: 1 Day Post-Op Procedure(s) (LRB): RIGHT TOTAL KNEE  ARTHROPLASTY (Right) Principal Problem:   OA (osteoarthritis) of knee  Estimated body mass index is 37.45 kg/m as calculated from the following:   Height as of this encounter: 5\' 6"  (1.676 m).   Weight as of this encounter: 105.2 kg (232 lb). Up with therapy Plan for discharge tomorrow  DVT Prophylaxis - Xarelto Weight-Bearing as tolerated to right leg D/C O2 and Pulse OX and try on Room Air  Susan Muslim, PA-C Orthopaedic Surgery 07/19/2017, 9:54 PM

## 2017-07-19 NOTE — Progress Notes (Addendum)
Physical Therapy Treatment Patient Details Name: Susan Jenkins MRN: [id number] DOB: 07-24-1945 Today's Date: 07/19/2017    History of Present Illness s/p R TKA    PT Comments    Pt agreeable to exercise focused session, OOB deferred d/t incr pain; RN contacted and gave meds durign session; will see in again in am, possible d/c tomorrow; will place order for  OT consult as well and continue to follow and progress PT POC   Follow Up Recommendations  Follow surgeon's recommendation for DC plan and follow-up therapies     Equipment Recommendations  3in1 (PT)    Recommendations for Other Services       Precautions / Restrictions Precautions Precautions: Knee;Fall Required Braces or Orthoses: Knee Immobilizer - Right Knee Immobilizer - Right: Discontinue once straight leg raise with < 10 degree lag Restrictions Weight Bearing Restrictions: No RLE Weight Bearing: Weight bearing as tolerated    Mobility  Bed Mobility               General bed mobility comments: pt in bed  Transfers                 General transfer comment: (deferred OOB d/t elevated pain)  Ambulation/Gait                 Stairs            Wheelchair Mobility    Modified Rankin (Stroke Patients Only)       Balance                                            Cognition Arousal/Alertness: Awake/alert Behavior During Therapy: WFL for tasks assessed/performed Overall Cognitive Status: Within Functional Limits for tasks assessed                                        Exercises Total Joint Exercises Ankle Circles/Pumps: AROM;Both;10 reps Quad Sets: 10 reps;AROM;Both Towel Squeeze: AROM;Both;10 reps Heel Slides: AAROM;10 reps;Right Hip ABduction/ADduction: AROM;AAROM;10 reps;Right    General Comments        Pertinent Vitals/Pain Pain Assessment: 0-10 Pain Score: 8  Pain Location: right knee Pain Descriptors / Indicators:  Discomfort;Grimacing;Guarding Pain Intervention(s): Limited activity within patient's tolerance;Monitored during session;RN gave pain meds during session;Ice applied    Home Living                      Prior Function            PT Goals (current goals can now be found in the care plan section) Acute Rehab PT Goals Patient Stated Goal: less pain with walking PT Goal Formulation: With patient Time For Goal Achievement: 07/26/17 Potential to Achieve Goals: Good Progress towards PT goals: Progressing toward goals    Frequency    7X/week      PT Plan Current plan remains appropriate    Co-evaluation              AM-PAC PT "6 Clicks" Daily Activity  Outcome Measure  Difficulty turning over in bed (including adjusting bedclothes, sheets and blankets)?: Unable Difficulty moving from lying on back to sitting on the side of the bed? : Unable Difficulty sitting down on and standing up from a chair with arms (e.g., wheelchair,  bedside commode, etc,.)?: Unable Help needed moving to and from a bed to chair (including a wheelchair)?: A Little Help needed walking in hospital room?: A Little Help needed climbing 3-5 steps with a railing? : A Lot 6 Click Score: 11    End of Session   Activity Tolerance: Patient limited by pain Patient left: in bed;with call bell/phone within reach;with bed alarm set   PT Visit Diagnosis: Difficulty in walking, not elsewhere classified (R26.2)     Time: 5686-1683 PT Time Calculation (min) (ACUTE ONLY): 18 min  Charges:  $Therapeutic Exercise: 8-22 mins                    G CodesKenyon Ana, PT Pager: [phone] 07/19/2017     [facility] 07/19/2017, 3:27 PM

## 2017-07-19 NOTE — Discharge Instructions (Signed)
° °Dr. Frank Aluisio °Total Joint Specialist °[facility] °[address] °[place], [place] [address] °[phone] ° °TOTAL KNEE REPLACEMENT POSTOPERATIVE DIRECTIONS ° °Knee Rehabilitation, Guidelines Following Surgery  °Results after knee surgery are often greatly improved when you follow the exercise, range of motion and muscle strengthening exercises prescribed by your doctor. Safety measures are also important to protect the knee from further injury. Any time any of these exercises cause you to have increased pain or swelling in your knee joint, decrease the amount until you are comfortable again and slowly increase them. If you have problems or questions, call your caregiver or physical therapist for advice.  ° °HOME CARE INSTRUCTIONS  °Remove items at home which could result in a fall. This includes throw rugs or furniture in walking pathways.  °· ICE to the affected knee every three hours for 30 minutes at a time and then as needed for pain and swelling.  Continue to use ice on the knee for pain and swelling from surgery. You may notice swelling that will progress down to the foot and ankle.  This is normal after surgery.  Elevate the leg when you are not up walking on it.   °· Continue to use the breathing machine which will help keep your temperature down.  It is common for your temperature to cycle up and down following surgery, especially at night when you are not up moving around and exerting yourself.  The breathing machine keeps your lungs expanded and your temperature down. °· Do not place pillow under knee, focus on keeping the knee straight while resting ° °DIET °You may resume your previous home diet once your are discharged from the hospital. ° °DRESSING / WOUND CARE / SHOWERING °You may shower 3 days after surgery, but keep the wounds dry during showering.  You may use an occlusive plastic wrap (Press'n Seal for example), NO SOAKING/SUBMERGING IN THE BATHTUB.  If the bandage gets  wet, change with a clean dry gauze.  If the incision gets wet, pat the wound dry with a clean towel. °You may start showering once you are discharged home but do not submerge the incision under water. Just pat the incision dry and apply a dry gauze dressing on daily. °Change the surgical dressing daily and reapply a dry dressing each time. ° °ACTIVITY °Walk with your walker as instructed. °Use walker as long as suggested by your caregivers. °Avoid periods of inactivity such as sitting longer than an hour when not asleep. This helps prevent blood clots.  °You may resume a sexual relationship in one month or when given the OK by your doctor.  °You may return to work once you are cleared by your doctor.  °Do not drive a car for 6 weeks or until released by you surgeon.  °Do not drive while taking narcotics. ° °WEIGHT BEARING °Weight bearing as tolerated with assist device (walker, cane, etc) as directed, use it as long as suggested by your surgeon or therapist, typically at least 4-6 weeks. ° °POSTOPERATIVE CONSTIPATION PROTOCOL °Constipation - defined medically as fewer than three stools per week and severe constipation as less than one stool per week. ° °One of the most common issues patients have following surgery is constipation.  Even if you have a regular bowel pattern at home, your normal regimen is likely to be disrupted due to multiple reasons following surgery.  Combination of anesthesia, postoperative narcotics, change in appetite and fluid intake all can affect your bowels.    In order to avoid complications following surgery, here are some recommendations in order to help you during your recovery period. ° °Colace (docusate) - Pick up an over-the-counter form of Colace or another stool softener and take twice a day as long as you are requiring postoperative pain medications.  Take with a full glass of water daily.  If you experience loose stools or diarrhea, hold the colace until you stool forms back up.  If  your symptoms do not get better within 1 week or if they get worse, check with your doctor. ° °Dulcolax (bisacodyl) - Pick up over-the-counter and take as directed by the product packaging as needed to assist with the movement of your bowels.  Take with a full glass of water.  Use this product as needed if not relieved by Colace only.  ° °MiraLax (polyethylene glycol) - Pick up over-the-counter to have on hand.  MiraLax is a solution that will increase the amount of water in your bowels to assist with bowel movements.  Take as directed and can mix with a glass of water, juice, soda, coffee, or tea.  Take if you go more than two days without a movement. °Do not use MiraLax more than once per day. Call your doctor if you are still constipated or irregular after using this medication for 7 days in a row. ° °If you continue to have problems with postoperative constipation, please contact the office for further assistance and recommendations.  If you experience "the worst abdominal pain ever" or develop nausea or vomiting, please contact the office immediatly for further recommendations for treatment. ° °ITCHING ° If you experience itching with your medications, try taking only a single pain pill, or even half a pain pill at a time.  You can also use Benadryl over the counter for itching or also to help with sleep.  ° °TED HOSE STOCKINGS °Wear the elastic stockings on both legs for three weeks following surgery during the day but you may remove then at night for sleeping. ° °MEDICATIONS °See your medication summary on the “After Visit Summary” that the nursing staff will review with you prior to discharge.  You may have some home medications which will be placed on hold until you complete the course of blood thinner medication.  It is important for you to complete the blood thinner medication as prescribed by your surgeon.  Continue your approved medications as instructed at time of discharge. ° °PRECAUTIONS °If you  experience chest pain or shortness of breath - call 911 immediately for transfer to the hospital emergency department.  °If you develop a fever greater that 101 F, purulent drainage from wound, increased redness or drainage from wound, foul odor from the wound/dressing, or calf pain - CONTACT YOUR SURGEON.   °                                                °FOLLOW-UP APPOINTMENTS °Make sure you keep all of your appointments after your operation with your surgeon and caregivers. You should call the office at the above phone number and make an appointment for approximately two weeks after the date of your surgery or on the date instructed by your surgeon outlined in the "After Visit Summary". ° ° °RANGE OF MOTION AND STRENGTHENING EXERCISES  °Rehabilitation of the knee is important following a knee injury or   an operation. After just a few days of immobilization, the muscles of the thigh which control the knee become weakened and shrink (atrophy). Knee exercises are designed to build up the tone and strength of the thigh muscles and to improve knee motion. Often times heat used for twenty to thirty minutes before working out will loosen up your tissues and help with improving the range of motion but do not use heat for the first two weeks following surgery. These exercises can be done on a training (exercise) mat, on the floor, on a table or on a bed. Use what ever works the best and is most comfortable for you Knee exercises include:  Leg Lifts - While your knee is still immobilized in a splint or cast, you can do straight leg raises. Lift the leg to 60 degrees, hold for 3 sec, and slowly lower the leg. Repeat 10-20 times 2-3 times daily. Perform this exercise against resistance later as your knee gets better.  Quad and Hamstring Sets - Tighten up the muscle on the front of the thigh (Quad) and hold for 5-10 sec. Repeat this 10-20 times hourly. Hamstring sets are done by pushing the foot backward against an object  and holding for 5-10 sec. Repeat as with quad sets.   Leg Slides: Lying on your back, slowly slide your foot toward your buttocks, bending your knee up off the floor (only go as far as is comfortable). Then slowly slide your foot back down until your leg is flat on the floor again.  Angel Wings: Lying on your back spread your legs to the side as far apart as you can without causing discomfort.  A rehabilitation program following serious knee injuries can speed recovery and prevent re-injury in the future due to weakened muscles. Contact your doctor or a physical therapist for more information on knee rehabilitation.   IF YOU ARE TRANSFERRED TO A SKILLED REHAB FACILITY If the patient is transferred to a skilled rehab facility following release from the hospital, a list of the current medications will be sent to the facility for the patient to continue.  When discharged from the skilled rehab facility, please have the facility set up the patient's [place] prior to being released. Also, the skilled facility will be responsible for providing the patient with their medications at time of release from the facility to include their pain medication, the muscle relaxants, and their blood thinner medication. If the patient is still at the rehab facility at time of the two week follow up appointment, the skilled rehab facility will also need to assist the patient in arranging follow up appointment in our office and any transportation needs.  MAKE SURE YOU:  Understand these instructions.  Get help right away if you are not doing well or get worse.    Pick up stool softner and laxative for home use following surgery while on pain medications. Do not submerge incision under water. Please use good hand washing techniques while changing dressing each day. May shower starting three days after surgery. Please use a clean towel to pat the incision dry following showers. Continue to use ice for  pain and swelling after surgery. Do not use any lotions or creams on the incision until instructed by your surgeon.  Take Xarelto for two and a half more weeks following discharge from the hospital, then discontinue Xarelto. Once the patient has completed the Xarelto, they may resume the 81 mg Aspirin.

## 2017-07-19 NOTE — Discharge Summary (Signed)
Physician Discharge Summary   Patient ID: Susan Jenkins MRN: [id number] DOB/AGE: [birth year] 72 y.o.  Admit date: 07/18/2017 Discharge date: 07-21-2017  Primary Diagnosis:  Osteoarthritis Right knee(s)   Admission Diagnoses:  Past Medical History:  Diagnosis Date  . Complication of anesthesia   . Depression   . Glaucoma   . Hemorrhoids   . HOCM (hypertrophic obstructive cardiomyopathy) ([place])   . Hyperlipidemia   . Hypertension   . PONV (postoperative nausea and vomiting)   . Pulmonary embolus ([place]) 1971   following surgery  . Vitamin D deficiency    Discharge Diagnoses:   Principal Problem:   OA (osteoarthritis) of knee  Estimated body mass index is 37.45 kg/m as calculated from the following:   Height as of this encounter: 5' 6"  (1.676 m).   Weight as of this encounter: 105.2 kg (232 lb).  Procedure:  Procedure(s) (LRB): RIGHT TOTAL KNEE ARTHROPLASTY (Right)   Consults: None  HPI: Susan Jenkins is a 72 y.o. year old female with end stage OA of her right knee with progressively worsening pain and dysfunction. She has constant pain, with activity and at rest and significant functional deficits with difficulties even with ADLs. She has had extensive non-op management including analgesics, injections of cortisone and viscosupplements, and home exercise program, but remains in significant pain with significant dysfunction.Radiographs show bone on bone arthritis medial and patellofemoral. She presents now for right Total Knee Arthroplasty.     Laboratory Data: Admission on 07/18/2017  Component Date Value Ref Range Status  . WBC 07/19/2017 15.5* 4.0 - 10.5 K/uL Final  . RBC 07/19/2017 3.45* 3.87 - 5.11 MIL/uL Final  . Hemoglobin 07/19/2017 11.8* 12.0 - 15.0 g/dL Final  . HCT 07/19/2017 34.8* 36.0 - 46.0 % Final  . MCV 07/19/2017 100.9* 78.0 - 100.0 fL Final  . MCH 07/19/2017 34.2* 26.0 - 34.0 pg Final  . MCHC 07/19/2017 33.9  30.0 - 36.0 g/dL Final  . RDW 07/19/2017  12.8  11.5 - 15.5 % Final  . Platelets 07/19/2017 242  150 - 400 K/uL Final   Performed at [facility], [place] [address], [place], [place] [address]  . Sodium 07/19/2017 135  135 - 145 mmol/L Final  . Potassium 07/19/2017 3.9  3.5 - 5.1 mmol/L Final  . Chloride 07/19/2017 99* 101 - 111 mmol/L Final  . CO2 07/19/2017 27  22 - 32 mmol/L Final  . Glucose, Bld 07/19/2017 119* 65 - 99 mg/dL Final  . BUN 07/19/2017 15  6 - 20 mg/dL Final  . Creatinine, Ser 07/19/2017 0.70  0.44 - 1.00 mg/dL Final  . Calcium 07/19/2017 9.0  8.9 - 10.3 mg/dL Final  . GFR calc non Af Amer 07/19/2017 >60  >60 mL/min Final  . GFR calc Af Amer 07/19/2017 >60  >60 mL/min Final   Comment: (NOTE) The eGFR has been calculated using the CKD EPI equation. This calculation has not been validated in all clinical situations. eGFR's persistently <60 mL/min signify possible Chronic Kidney Disease.   Georgiann Hahn gap 07/19/2017 9  5 - 15 Final   Performed at [facility], [place] [address] [place], [place] [address]  . WBC 07/20/2017 16.6* 4.0 - 10.5 K/uL Final  . RBC 07/20/2017 3.28* 3.87 - 5.11 MIL/uL Final  . Hemoglobin 07/20/2017 11.4* 12.0 - 15.0 g/dL Final  . HCT 07/20/2017 33.1* 36.0 - 46.0 % Final  . MCV 07/20/2017 100.9* 78.0 - 100.0 fL Final  . MCH 07/20/2017 34.8*  26.0 - 34.0 pg Final  . MCHC 07/20/2017 34.4  30.0 - 36.0 g/dL Final  . RDW 07/20/2017 13.1  11.5 - 15.5 % Final  . Platelets 07/20/2017 255  150 - 400 K/uL Final   Performed at [facility], [place] [address] [place], [place] [address]  . Sodium 07/20/2017 134* 135 - 145 mmol/L Final  . Potassium 07/20/2017 3.4* 3.5 - 5.1 mmol/L Final  . Chloride 07/20/2017 96* 101 - 111 mmol/L Final  . CO2 07/20/2017 27  22 - 32 mmol/L Final  . Glucose, Bld 07/20/2017 149* 65 - 99 mg/dL Final  . BUN 07/20/2017 19  6 - 20 mg/dL Final  . Creatinine, Ser 07/20/2017 0.83  0.44 - 1.00 mg/dL Final  . Calcium 07/20/2017 9.1   8.9 - 10.3 mg/dL Final  . GFR calc non Af Amer 07/20/2017 >60  >60 mL/min Final  . GFR calc Af Amer 07/20/2017 >60  >60 mL/min Final   Comment: (NOTE) The eGFR has been calculated using the CKD EPI equation. This calculation has not been validated in all clinical situations. eGFR's persistently <60 mL/min signify possible Chronic Kidney Disease.   Georgiann Hahn gap 07/20/2017 11  5 - 15 Final   Performed at [facility], [place] [address] [place], [place] [address]  . WBC 07/21/2017 14.2* 4.0 - 10.5 K/uL Final   WHITE COUNT CONFIRMED ON SMEAR  . RBC 07/21/2017 3.19* 3.87 - 5.11 MIL/uL Final  . Hemoglobin 07/21/2017 11.0* 12.0 - 15.0 g/dL Final  . HCT 07/21/2017 31.6* 36.0 - 46.0 % Final  . MCV 07/21/2017 99.1  78.0 - 100.0 fL Final  . MCH 07/21/2017 34.5* 26.0 - 34.0 pg Final  . MCHC 07/21/2017 34.8  30.0 - 36.0 g/dL Final  . RDW 07/21/2017 13.0  11.5 - 15.5 % Final  . Platelets 07/21/2017 224  150 - 400 K/uL Final   Performed at [facility], [place] [address] [place], [place] [address]  Hospital Outpatient Visit on 07/13/2017  Component Date Value Ref Range Status  . aPTT 07/13/2017 25  24 - 36 seconds Final   Performed at [facility], [place] [address] [place], [place] [address]  . WBC 07/13/2017 5.8  4.0 - 10.5 K/uL Final  . RBC 07/13/2017 3.80* 3.87 - 5.11 MIL/uL Final  . Hemoglobin 07/13/2017 13.1  12.0 - 15.0 g/dL Final  . HCT 07/13/2017 38.2  36.0 - 46.0 % Final  . MCV 07/13/2017 100.5* 78.0 - 100.0 fL Final  . MCH 07/13/2017 34.5* 26.0 - 34.0 pg Final  . MCHC 07/13/2017 34.3  30.0 - 36.0 g/dL Final  . RDW 07/13/2017 13.1  11.5 - 15.5 % Final  . Platelets 07/13/2017 254  150 - 400 K/uL Final   Performed at [facility], [place] [address], [place], [place] [address]  . Sodium 07/13/2017 135  135 - 145 mmol/L Final  . Potassium 07/13/2017 3.9  3.5 - 5.1 mmol/L Final  . Chloride 07/13/2017 98* 101 - 111 mmol/L  Final  . CO2 07/13/2017 29  22 - 32 mmol/L Final  . Glucose, Bld 07/13/2017 104* 65 - 99 mg/dL Final  . BUN 07/13/2017 19  6 - 20 mg/dL Final  . Creatinine, Ser 07/13/2017 0.77  0.44 - 1.00 mg/dL Final  . Calcium 07/13/2017 9.5  8.9 - 10.3 mg/dL Final  . Total Protein 07/13/2017 7.1  6.5 - 8.1 g/dL Final  . Albumin 07/13/2017 4.1  3.5 -  5.0 g/dL Final  . AST 07/13/2017 28  15 - 41 U/L Final  . ALT 07/13/2017 24  14 - 54 U/L Final  . Alkaline Phosphatase 07/13/2017 48  38 - 126 U/L Final  . Total Bilirubin 07/13/2017 0.3  0.3 - 1.2 mg/dL Final  . GFR calc non Af Amer 07/13/2017 >60  >60 mL/min Final  . GFR calc Af Amer 07/13/2017 >60  >60 mL/min Final   Comment: (NOTE) The eGFR has been calculated using the CKD EPI equation. This calculation has not been validated in all clinical situations. eGFR's persistently <60 mL/min signify possible Chronic Kidney Disease.   Georgiann Hahn gap 07/13/2017 8  5 - 15 Final   Performed at [facility], [place] [address], [place], [place] [address]  . Prothrombin Time 07/13/2017 12.5  11.4 - 15.2 seconds Final  . INR 07/13/2017 0.94   Final   Performed at [facility], [place] [address], [place], [place] [address]  . ABO/RH(D) 07/13/2017 A POS   Final  . Antibody Screen 07/13/2017 NEG   Final  . Sample Expiration 07/13/2017 07/21/2017   Final  . Extend sample reason 07/13/2017    Final                   Value:NO TRANSFUSIONS OR PREGNANCY IN THE PAST 3 MONTHS Performed at [facility], [place] [address] [place], [place] [address]   . MRSA, PCR 07/13/2017 NEGATIVE  NEGATIVE Final  . Staphylococcus aureus 07/13/2017 NEGATIVE  NEGATIVE Final   Comment: (NOTE) The Xpert SA Assay (FDA approved for NASAL specimens in patients 43 years of age and older), is one component of a comprehensive surveillance program. It is not intended to diagnose infection nor to guide or monitor treatment. Performed at [facility], [place] [address] [place], [place] [address]      X-Rays:Dg Wrist Complete Right  Result Date: 07/04/2017 CLINICAL DATA:  Per EMS-tripped and fell forward-hematoma above left eye, abrasion to left side of face-no blood thinners, complaining of a headache, left knee and right wrist. EXAM: RIGHT WRIST - COMPLETE 3+ VIEW COMPARISON:  None. FINDINGS: No fracture.  No bone lesion. The joints are normally aligned. There is joint space narrowing involving the scaphoid trapezium trapezoid articulation and the trapezium first metacarpal articulation consistent with osteoarthritis. Remaining joints are normally spaced and aligned. Soft tissues are unremarkable. IMPRESSION: No fracture or dislocation. Electronically Signed   By: Lajean Manes M.D.   On: 07/04/2017 12:59   Ct Head Wo Contrast  Result Date: 07/04/2017 CLINICAL DATA:  Fall, facial injury EXAM: CT HEAD WITHOUT CONTRAST CT MAXILLOFACIAL WITHOUT CONTRAST CT CERVICAL SPINE WITHOUT CONTRAST TECHNIQUE: Multidetector CT imaging of the head, cervical spine, and maxillofacial structures were performed using the standard protocol without intravenous contrast. Multiplanar CT image reconstructions of the cervical spine and maxillofacial structures were also generated. COMPARISON:  None. FINDINGS: CT HEAD FINDINGS Brain: Ventricle size normal. Mild changes in the cerebral white matter bilaterally appear chronic. No acute infarct, hemorrhage, or mass. Negative for fluid collection or midline shift. Vascular: Negative for hyperdense vessel Skull: Negative Other: None CT MAXILLOFACIAL FINDINGS Osseous: Negative for facial fracture Orbits: Negative Sinuses: Mild mucosal edema paranasal sinuses. 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 Small fluid level in the mastoid tip on the right. Left mastoid sinus clear Soft tissues: Diffuse dermal calcifications. No acute soft tissue swelling CT CERVICAL SPINE FINDINGS  Alignment: 2  mm anterolisthesis C4-5 felt to be degenerative. Disc and facet degeneration throughout the cervical spine. Spondylitic change most prominent at C5-6 and C6-7 Skull base and vertebrae: Negative for fracture or Soft tissues and spinal canal: Multilevel spinal stenosis due to spurring. No soft tissue mass Disc levels: Disc degeneration and spondylosis diffusely most prominent C5-6 and C6-7. 2 mm anterolisthesis C4-5. Prominent left foraminal encroachment C6-7 due to osteophyte. Right foraminal encroachment C5-6 and C6-7 due to osteophyte Upper chest: Negative Other: None IMPRESSION: 1. No acute intracranial abnormality. Mild chronic appearing white matter changes 2. Negative for facial fracture.  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 Negative for cervical spine fracture. Multilevel cervical spondylosis. Electronically Signed   By: Franchot Gallo M.D.   On: 07/04/2017 16:07   Ct Cervical Spine Wo Contrast  Result Date: 07/04/2017 CLINICAL DATA:  Fall, facial injury EXAM: CT HEAD WITHOUT CONTRAST CT MAXILLOFACIAL WITHOUT CONTRAST CT CERVICAL SPINE WITHOUT CONTRAST TECHNIQUE: Multidetector CT imaging of the head, cervical spine, and maxillofacial structures were performed using the standard protocol without intravenous contrast. Multiplanar CT image reconstructions of the cervical spine and maxillofacial structures were also generated. COMPARISON:  None. FINDINGS: CT HEAD FINDINGS Brain: Ventricle size normal. Mild changes in the cerebral white matter bilaterally appear chronic. No acute infarct, hemorrhage, or mass. Negative for fluid collection or midline shift. Vascular: Negative for hyperdense vessel Skull: Negative Other: None CT MAXILLOFACIAL FINDINGS Osseous: Negative for facial fracture Orbits: Negative Sinuses: Mild mucosal edema paranasal sinuses. 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 Small fluid level in the mastoid tip on the right. Left mastoid  sinus clear Soft tissues: Diffuse dermal calcifications. No acute soft tissue swelling CT CERVICAL SPINE FINDINGS Alignment: 2 mm anterolisthesis C4-5 felt to be degenerative. Disc and facet degeneration throughout the cervical spine. Spondylitic change most prominent at C5-6 and C6-7 Skull base and vertebrae: Negative for fracture or Soft tissues and spinal canal: Multilevel spinal stenosis due to spurring. No soft tissue mass Disc levels: Disc degeneration and spondylosis diffusely most prominent C5-6 and C6-7. 2 mm anterolisthesis C4-5. Prominent left foraminal encroachment C6-7 due to osteophyte. Right foraminal encroachment C5-6 and C6-7 due to osteophyte Upper chest: Negative Other: None IMPRESSION: 1. No acute intracranial abnormality. Mild chronic appearing white matter changes 2. Negative for facial fracture.  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 Negative for cervical spine fracture. Multilevel cervical spondylosis. Electronically Signed   By: Franchot Gallo M.D.   On: 07/04/2017 16:07   Dg Knee Complete 4 Views Left  Result Date: 07/04/2017 CLINICAL DATA:  Per EMS-tripped and fell forward-hematoma above left eye, abrasion to left side of face-no blood thinners, complaining of a headache, left knee and right wrist. EXAM: LEFT KNEE - COMPLETE 4+ VIEW COMPARISON:  None. FINDINGS: No fracture.  No bone lesion. Moderate narrowing of the medial joint space compartment. Small marginal osteophytes from the medial compartment. No joint effusion. Surrounding soft tissues are unremarkable. IMPRESSION: 1. No fracture or acute finding. 2. Mild-to-moderate osteoarthritis involving the medial compartment. Electronically Signed   By: Lajean Manes M.D.   On: 07/04/2017 12:06   Ct Maxillofacial Wo Contrast  Result Date: 07/04/2017 CLINICAL DATA:  Fall, facial injury EXAM: CT HEAD WITHOUT CONTRAST CT MAXILLOFACIAL WITHOUT CONTRAST CT CERVICAL SPINE WITHOUT CONTRAST TECHNIQUE: Multidetector CT imaging of the head,  cervical spine, and maxillofacial structures were performed using the standard protocol without intravenous contrast. Multiplanar CT image reconstructions of the cervical spine and maxillofacial structures were also  generated. COMPARISON:  None. FINDINGS: CT HEAD FINDINGS Brain: Ventricle size normal. Mild changes in the cerebral white matter bilaterally appear chronic. No acute infarct, hemorrhage, or mass. Negative for fluid collection or midline shift. Vascular: Negative for hyperdense vessel Skull: Negative Other: None CT MAXILLOFACIAL FINDINGS Osseous: Negative for facial fracture Orbits: Negative Sinuses: Mild mucosal edema paranasal sinuses. 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 Small fluid level in the mastoid tip on the right. Left mastoid sinus clear Soft tissues: Diffuse dermal calcifications. No acute soft tissue swelling CT CERVICAL SPINE FINDINGS Alignment: 2 mm anterolisthesis C4-5 felt to be degenerative. Disc and facet degeneration throughout the cervical spine. Spondylitic change most prominent at C5-6 and C6-7 Skull base and vertebrae: Negative for fracture or Soft tissues and spinal canal: Multilevel spinal stenosis due to spurring. No soft tissue mass Disc levels: Disc degeneration and spondylosis diffusely most prominent C5-6 and C6-7. 2 mm anterolisthesis C4-5. Prominent left foraminal encroachment C6-7 due to osteophyte. Right foraminal encroachment C5-6 and C6-7 due to osteophyte Upper chest: Negative Other: None IMPRESSION: 1. No acute intracranial abnormality. Mild chronic appearing white matter changes 2. Negative for facial fracture.  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 Negative for cervical spine fracture. Multilevel cervical spondylosis. Electronically Signed   By: Franchot Gallo M.D.   On: 07/04/2017 16:07    EKG: Orders placed or performed during the hospital encounter of 07/13/17  . EKG 12 lead  . EKG 12 lead     Hospital  Course: Susan Jenkins is a 72 y.o. who was admitted to [facility]. They were brought to the operating room on 07/18/2017 and underwent Procedure(s): RIGHT TOTAL KNEE ARTHROPLASTY.  Patient tolerated the procedure well and was later transferred to the recovery room and then to the orthopaedic floor for postoperative care.  They were given PO and IV analgesics for pain control following their surgery.  They were given 24 hours of postoperative antibiotics of  Anti-infectives (From admission, onward)   Start     Dose/Rate Route Frequency Ordered Stop   07/18/17 2300  vancomycin (VANCOCIN) IVPB 1000 mg/200 mL premix     1,000 mg 200 mL/hr over 60 Minutes Intravenous Every 12 hours 07/18/17 1505 07/19/17 0100   07/18/17 0600  vancomycin (VANCOCIN) 1,500 mg in sodium chloride 0.9 % 500 mL IVPB     1,500 mg 250 mL/hr over 120 Minutes Intravenous On call to O.R. 07/17/17 1046 07/18/17 1305     and started on DVT prophylaxis in the form of Xarelto.   PT and OT were ordered for total joint protocol.  Discharge planning consulted to help with postop disposition and equipment needs.  Patient had a decent night on the evening of surgery.  They started to get up OOB with therapy on day one. Hemovac drain was pulled without difficulty.  Continued to work with therapy into day two.  Dressing was changed on day two and the incision was healing well.  By day three, the patient had progressed with therapy and meeting their goals.  Incision was healing well.  Patient was seen in rounds and was ready to go home.   Diet - Cardiac diet Follow up - in 2 weeks Activity - WBAT Disposition - Home Condition Upon Discharge - Stable D/C Meds - See DC Summary DVT Prophylaxis - Xarelto     Discharge Instructions    Call MD / Call 911   Complete by:  As directed  If you experience chest pain or shortness of breath, CALL 911 and be transported to the hospital emergency room.  If you develope a fever above 101  F, pus (white drainage) or increased drainage or redness at the wound, or calf pain, call your surgeon's office.   Change dressing   Complete by:  As directed    Change dressing daily with sterile 4 x 4 inch gauze dressing and apply TED hose. Do not submerge the incision under water.   Constipation Prevention   Complete by:  As directed    Drink plenty of fluids.  Prune juice may be helpful.  You may use a stool softener, such as Colace (over the counter) 100 mg twice a day.  Use MiraLax (over the counter) for constipation as needed.   Diet - low sodium heart healthy   Complete by:  As directed    Discharge instructions   Complete by:  As directed    Take Xarelto for two and a half more weeks, then discontinue Xarelto. Once the patient has completed the Xarelto, they may resume the 81 mg Aspirin.   Pick up stool softner and laxative for home use following surgery while on pain medications. Do not submerge incision under water. Please use good hand washing techniques while changing dressing each day. May shower starting three days after surgery. Please use a clean towel to pat the incision dry following showers. Continue to use ice for pain and swelling after surgery. Do not use any lotions or creams on the incision until instructed by your surgeon.  Wear both TED hose on both legs during the day every day for three weeks, but may remove the TED hose at night at home.  Postoperative Constipation Protocol  Constipation - defined medically as fewer than three stools per week and severe constipation as less than one stool per week.  One of the most common issues patients have following surgery is constipation.  Even if you have a regular bowel pattern at home, your normal regimen is likely to be disrupted due to multiple reasons following surgery.  Combination of anesthesia, postoperative narcotics, change in appetite and fluid intake all can affect your bowels.  In order to avoid  complications following surgery, here are some recommendations in order to help you during your recovery period.  Colace (docusate) - Pick up an over-the-counter form of Colace or another stool softener and take twice a day as long as you are requiring postoperative pain medications.  Take with a full glass of water daily.  If you experience loose stools or diarrhea, hold the colace until you stool forms back up.  If your symptoms do not get better within 1 week or if they get worse, check with your doctor.  Dulcolax (bisacodyl) - Pick up over-the-counter and take as directed by the product packaging as needed to assist with the movement of your bowels.  Take with a full glass of water.  Use this product as needed if not relieved by Colace only.   MiraLax (polyethylene glycol) - Pick up over-the-counter to have on hand.  MiraLax is a solution that will increase the amount of water in your bowels to assist with bowel movements.  Take as directed and can mix with a glass of water, juice, soda, coffee, or tea.  Take if you go more than two days without a movement. Do not use MiraLax more than once per day. Call your doctor if you are still constipated or irregular after  using this medication for 7 days in a row.  If you continue to have problems with postoperative constipation, please contact the office for further assistance and recommendations.  If you experience "the worst abdominal pain ever" or develop nausea or vomiting, please contact the office immediatly for further recommendations for treatment.   Do not put a pillow under the knee. Place it under the heel.   Complete by:  As directed    Do not sit on low chairs, stoools or toilet seats, as it may be difficult to get up from low surfaces   Complete by:  As directed    Driving restrictions   Complete by:  As directed    No driving until released by the physician.   Increase activity slowly as tolerated   Complete by:  As directed    Lifting  restrictions   Complete by:  As directed    No lifting until released by the physician.   Patient may shower   Complete by:  As directed    You may shower without a dressing once there is no drainage.  Do not wash over the wound.  If drainage remains, do not shower until drainage stops.   TED hose   Complete by:  As directed    Use stockings (TED hose) for 3 weeks on both leg(s).  You may remove them at night for sleeping.   Weight bearing as tolerated   Complete by:  As directed    Laterality:  right   Extremity:  Lower     Allergies as of 07/21/2017      Reactions   Amoxicillin-pot Clavulanate Anaphylaxis   Has patient had a PCN reaction causing immediate rash, facial/tongue/throat swelling, SOB or lightheadedness with hypotension: Yes Has patient had a PCN reaction causing severe rash involving mucus membranes or skin necrosis: Yes Has patient had a PCN reaction that required hospitalization Yes Has patient had a PCN reaction occurring within the last 10 years: No If all of the above answers are "NO", then may proceed with Cephalosporin use.   Augmentin [amoxicillin-pot Clavulanate]    Nickel Rash      Medication List    STOP taking these medications   aspirin 81 MG tablet   Calcium Carbonate-Vit D-Min 600-400 MG-UNIT Tabs   diclofenac sodium 1 % Gel Commonly known as:  VOLTAREN   FISH OIL PO   glucosamine-chondroitin 500-400 MG tablet   KAOPECTATE PO   lactobacillus acidophilus Tabs tablet   meloxicam 15 MG tablet Commonly known as:  MOBIC   multivitamin capsule   pyridOXINE 50 MG tablet Commonly known as:  B-6   saccharomyces boulardii 250 MG capsule Commonly known as:  FLORASTOR     TAKE these medications   amLODipine 10 MG tablet Commonly known as:  NORVASC TAKE 1 TABLET BY MOUTH EVERY DAY   hydrochlorothiazide 25 MG tablet Commonly known as:  HYDRODIURIL Take 1 tablet (25 mg total) by mouth daily.   HYDROmorphone 2 MG tablet Commonly known as:   DILAUDID Take 1-2 tablets (2-4 mg total) by mouth every 4 (four) hours as needed for moderate pain or severe pain.   latanoprost 0.005 % ophthalmic solution Commonly known as:  XALATAN Place 1 drop into both eyes at bedtime.   methocarbamol 500 MG tablet Commonly known as:  ROBAXIN Take 1 tablet (500 mg total) by mouth every 6 (six) hours as needed for muscle spasms.   ondansetron 4 MG disintegrating tablet Commonly known as:  ZOFRAN ODT Take 1 tablet (4 mg total) by mouth every 8 (eight) hours as needed for nausea or vomiting.   RHOPRESSA OP Apply to eye.   rivaroxaban 10 MG Tabs tablet Commonly known as:  XARELTO Take 1 tablet (10 mg total) by mouth daily with breakfast. Take Xarelto for two and a half more weeks following discharge from the hospital, then discontinue Xarelto. Once the patient has completed the Xarelto, they may resume the 81 mg Aspirin.   rosuvastatin 10 MG tablet Commonly known as:  CRESTOR TAKE 1 TABLET BY MOUTH EVERY DAY   timolol 0.5 % ophthalmic solution Commonly known as:  TIMOPTIC Place 1 drop into both eyes 2 (two) times daily.   venlafaxine XR 75 MG 24 hr capsule Commonly known as:  EFFEXOR-XR TAKE ONE CAPSULE BY MOUTH EVERY DAY            Durable Medical Equipment  (From admission, onward)        Start     Ordered   07/19/17 1057  For home use only DME 3 n 1  Once     07/19/17 1056       Discharge Care Instructions  (From admission, onward)        Start     Ordered   07/19/17 0000  Weight bearing as tolerated    Question Answer Comment  Laterality right   Extremity Lower      07/19/17 2204   07/19/17 0000  Change dressing    Comments:  Change dressing daily with sterile 4 x 4 inch gauze dressing and apply TED hose. Do not submerge the incision under water.   07/19/17 [place] Follow up.   Why:  3n1 Contact information: [address]  [address] [phone]        Gaynelle Arabian, MD. Schedule an appointment as soon as possible for a visit on 08/02/2017.   Specialty:  Orthopedic Surgery Contact information: [address] [place] [address] [phone]           Signed: [name], PA-C Orthopaedic Surgery 07/21/2017, 8:22 AM

## 2017-07-19 NOTE — Progress Notes (Signed)
Spoke with patient at bedside. Confirmed plan for OP PT, already arranged. Has RW but needs a 3n1. Contacted [facility] to deliver to the room. [phone]

## 2017-07-19 NOTE — Evaluation (Signed)
Physical Therapy Evaluation Patient Details Name: Susan Jenkins MRN: [id number] DOB: Mar 01, 1946 Today's Date: 07/19/2017   History of Present Illness  s/p R TKA  Clinical Impression  Pt is s/p TKA resulting in the deficits listed below (see PT Problem List).  Pt progressing well, somewhat limited by pain this morning  Pt will benefit from skilled PT to increase their independence and safety with mobility to allow discharge to the venue listed below.      Follow Up Recommendations Follow surgeon's recommendation for DC plan and follow-up therapies    Equipment Recommendations  3in1 (PT)(pt wants if INS covers)    Recommendations for Other Services       Precautions / Restrictions Precautions Precautions: Knee;Fall Precaution Comments: independent SLR today Required Braces or Orthoses: Knee Immobilizer - Right Knee Immobilizer - Right: Discontinue once straight leg raise with < 10 degree lag Restrictions Weight Bearing Restrictions: Yes RLE Weight Bearing: Weight bearing as tolerated      Mobility  Bed Mobility               General bed mobility comments: pt received in chair  Transfers Overall transfer level: Needs assistance Equipment used: Rolling walker (2 wheeled) Transfers: Sit to/from Stand Sit to Stand: Min guard         General transfer comment: cues for hand placement and RLE position  Ambulation/Gait Ambulation/Gait assistance: Min guard;Min assist Ambulation Distance (Feet): 40 Feet Assistive device: Rolling walker (2 wheeled) Gait Pattern/deviations: Step-to pattern;Decreased weight shift to right     General Gait Details: cues for sequence, RW position; distance is limited by pain  Stairs            Wheelchair Mobility    Modified Rankin (Stroke Patients Only)       Balance                                             Pertinent Vitals/Pain Pain Assessment: 0-10 Pain Score: 5  Pain Location: right  knee Pain Descriptors / Indicators: Grimacing Pain Intervention(s): Limited activity within patient's tolerance;Monitored during session    Home Living Family/patient expects to be discharged to:: Private residence Living Arrangements: Alone Available Help at Discharge: Family(sister) Type of Home: House Home Access: Stairs to enter   [employer] of Steps: 1 and 1 Home Layout: One level Home Equipment: Bedside commode;Walker - 2 wheels;Cane - single point;Crutches;Wheelchair - manual      Prior Function Level of Independence: Independent               Hand Dominance        Extremity/Trunk Assessment   Upper Extremity Assessment Upper Extremity Assessment: Overall WFL for tasks assessed    Lower Extremity Assessment Lower Extremity Assessment: RLE deficits/detail RLE Deficits / Details: ankle WFL, knee extension and hip flexion 3/5; AAROM knee flexion 6* to 45*       Communication   Communication: No difficulties  Cognition Arousal/Alertness: Awake/alert Behavior During Therapy: WFL for tasks assessed/performed Overall Cognitive Status: Within Functional Limits for tasks assessed                                        General Comments      Exercises Total Joint Exercises Ankle Circles/Pumps: AROM;Both;15 reps [employer]  Sets: 10 reps;AROM Straight Leg Raises: AROM;Right;10 reps   Assessment/Plan    PT Assessment Patient needs continued PT services  PT Problem List Decreased strength;Decreased range of motion;Decreased activity tolerance;Decreased mobility;Pain       PT Treatment Interventions DME instruction;Gait training;Therapeutic activities;Therapeutic exercise;Functional mobility training;Patient/family education;Stair training    PT Goals (Current goals can be found in the Care Plan section)  Acute Rehab PT Goals Patient Stated Goal: less pain with walking PT Goal Formulation: With patient Time For Goal Achievement:  07/26/17 Potential to Achieve Goals: Good    Frequency 7X/week   Barriers to discharge        Co-evaluation               AM-PAC PT "6 Clicks" Daily Activity  Outcome Measure Difficulty turning over in bed (including adjusting bedclothes, sheets and blankets)?: Unable Difficulty moving from lying on back to sitting on the side of the bed? : Unable Difficulty sitting down on and standing up from a chair with arms (e.g., wheelchair, bedside commode, etc,.)?: Unable Help needed moving to and from a bed to chair (including a wheelchair)?: A Little Help needed walking in hospital room?: A Little Help needed climbing 3-5 steps with a railing? : A Lot 6 Click Score: 11    End of Session Equipment Utilized During Treatment: Gait belt Activity Tolerance: Patient tolerated treatment well Patient left: in chair;with call bell/phone within reach;with family/visitor present;with chair alarm set   PT Visit Diagnosis: Difficulty in walking, not elsewhere classified (R26.2)    Time: 0937-1000 PT Time Calculation (min) (ACUTE ONLY): 23 min   Charges:   PT Evaluation $PT Eval Low Complexity: 1 Low PT Treatments $Gait Training: 8-22 mins   PT G CodesKenyon Ana, PT Pager: [phone] 07/19/2017   Kenyon Ana 07/19/2017, 11:08 AM

## 2017-07-20 LAB — BASIC METABOLIC PANEL
ANION GAP: 11 (ref 5–15)
BUN: 19 mg/dL (ref 6–20)
CHLORIDE: 96 mmol/L — AB (ref 101–111)
CO2: 27 mmol/L (ref 22–32)
Calcium: 9.1 mg/dL (ref 8.9–10.3)
Creatinine, Ser: 0.83 mg/dL (ref 0.44–1.00)
GFR calc Af Amer: >60 mL/min (ref >60)
GLUCOSE: 149 mg/dL — AB (ref 65–99)
POTASSIUM: 3.4 mmol/L — AB (ref 3.5–5.1)
Sodium: 134 mmol/L — ABNORMAL LOW (ref 135–145)

## 2017-07-20 LAB — CBC
HCT: 33.1 % — ABNORMAL LOW (ref 36.0–46.0)
HEMOGLOBIN: 11.4 g/dL — AB (ref 12.0–15.0)
MCH: 34.8 pg — ABNORMAL HIGH (ref 26.0–34.0)
MCHC: 34.4 g/dL (ref 30.0–36.0)
MCV: 100.9 fL — AB (ref 78.0–100.0)
Platelets: 255 10*3/uL (ref 150–400)
RBC: 3.28 MIL/uL — AB (ref 3.87–5.11)
RDW: 13.1 % (ref 11.5–15.5)
WBC: 16.6 10*3/uL — AB (ref 4.0–10.5)

## 2017-07-20 MED ORDER — POTASSIUM CHLORIDE CRYS ER 20 MEQ PO TBCR
40.0000 meq | EXTENDED_RELEASE_TABLET | ORAL | Status: AC
  Administered 2017-07-20 (×2): 40 meq via ORAL
  Filled 2017-07-20 (×3): qty 2

## 2017-07-20 MED ORDER — HYDROMORPHONE HCL 2 MG PO TABS
2.0000 mg | ORAL_TABLET | ORAL | Status: DC | PRN
  Administered 2017-07-20 (×2): 4 mg via ORAL
  Administered 2017-07-20: 09:00:00 2 mg via ORAL
  Administered 2017-07-20: 23:00:00 4 mg via ORAL
  Administered 2017-07-20: 12:00:00 2 mg via ORAL
  Administered 2017-07-21 (×3): 4 mg via ORAL
  Filled 2017-07-20 (×5): qty 2
  Filled 2017-07-20 (×2): qty 1
  Filled 2017-07-20: qty 2

## 2017-07-20 NOTE — Progress Notes (Signed)
Physical Therapy Treatment Patient Details Name: Susan Jenkins MRN: [id number] DOB: Sep 22, 1945 Today's Date: 07/20/2017    History of Present Illness s/p R TKA    PT Comments    Pt and sister educated on donning KI.  Pt had just been up with OT prior to arrival.  Pt ambulated in hallway and again reports dizziness however able to continue ambulating this afternoon.  Pt declined practicing step today preferring to wait until tomorrow since she reports plan to stay overnight and d/c home tomorrow.   Follow Up Recommendations  Follow surgeon's recommendation for DC plan and follow-up therapies     Equipment Recommendations  3in1 (PT)    Recommendations for Other Services       Precautions / Restrictions Precautions Precautions: Knee;Fall Required Braces or Orthoses: Knee Immobilizer - Right Knee Immobilizer - Right: Discontinue once straight leg raise with < 10 degree lag Restrictions RLE Weight Bearing: Weight bearing as tolerated    Mobility  Bed Mobility Overal bed mobility: Needs Assistance Bed Mobility: Sit to Supine     Supine to sit: Min assist;HOB elevated Sit to supine: Min guard   General bed mobility comments: verbal cues for use of leg lifter to self assist R LE  Transfers Overall transfer level: Needs assistance Equipment used: Rolling walker (2 wheeled) Transfers: Sit to/from Stand Sit to Stand: Min guard         General transfer comment: verbal cues for UE and LE positioning  Ambulation/Gait Ambulation/Gait assistance: Min guard;Min assist Ambulation Distance (Feet): 80 Feet Assistive device: Rolling walker (2 wheeled) Gait Pattern/deviations: Step-to pattern;Decreased stance time - right;Antalgic Gait velocity: decr   General Gait Details: verbal cues for sequence, RW positioning, reports slight dizziness however felt able to continue without sitting; min assist for LOB with backing up to bed   Stairs            Wheelchair Mobility    Modified Rankin (Stroke Patients Only)       Balance                                            Cognition Arousal/Alertness: Awake/alert Behavior During Therapy: WFL for tasks assessed/performed Overall Cognitive Status: Within Functional Limits for tasks assessed                                        Exercises     General Comments        Pertinent Vitals/Pain Pain Assessment: 0-10 Pain Score: 7  Pain Location: right knee Pain Descriptors / Indicators: Discomfort;Sore Pain Intervention(s): Limited activity within patient's tolerance;Monitored during session;Repositioned;Ice applied    Home Living Family/patient expects to be discharged to:: Private residence Living Arrangements: Alone Available Help at Discharge: Family(sister) Type of Home: House Home Access: Stairs to enter   Home Layout: One level Home Equipment: Bedside commode;Walker - 2 wheels;Cane - single point;Crutches;Wheelchair - manual      Prior Function Level of Independence: Independent          PT Goals (current goals can now be found in the care plan section) Progress towards PT goals: Progressing toward goals    Frequency    7X/week      PT Plan Current plan remains appropriate    Co-evaluation  AM-PAC PT "6 Clicks" Daily Activity  Outcome Measure  Difficulty turning over in bed (including adjusting bedclothes, sheets and blankets)?: Unable Difficulty moving from lying on back to sitting on the side of the bed? : Unable Difficulty sitting down on and standing up from a chair with arms (e.g., wheelchair, bedside commode, etc,.)?: Unable Help needed moving to and from a bed to chair (including a wheelchair)?: A Little Help needed walking in hospital room?: A Little Help needed climbing 3-5 steps with a railing? : A Lot 6 Click Score: 11    End of Session Equipment Utilized During Treatment: Gait belt Activity Tolerance:  Patient limited by fatigue Patient left: with call bell/phone within reach;with family/visitor present;in bed   PT Visit Diagnosis: Difficulty in walking, not elsewhere classified (R26.2)     Time: 2060-1561 PT Time Calculation (min) (ACUTE ONLY): 18 min  Charges:  $Gait Training: 8-22 mins                     G Codes:      Carmelia Bake, PT, DPT 07/20/2017 Pager: [phone]  York Ram E 07/20/2017, 4:08 PM

## 2017-07-20 NOTE — Evaluation (Signed)
Occupational Therapy Evaluation Patient Details Name: Susan Jenkins MRN: [id number] DOB: 1945/12/31 Today's Date: 07/20/2017    History of Present Illness s/p R TKA   Clinical Impression   Pt is s/p TKA resulting in the deficits listed below (see OT Problem List).  Pt will benefit from skilled OT to increase their safety and independence with ADL and functional mobility for ADL to facilitate discharge to venue listed below.        Follow Up Recommendations  No OT follow up          Precautions / Restrictions Precautions Precautions: Knee;Fall Required Braces or Orthoses: Knee Immobilizer - Right Knee Immobilizer - Right: Discontinue once straight leg raise with < 10 degree lag Restrictions RLE Weight Bearing: Weight bearing as tolerated      Mobility Bed Mobility Overal bed mobility: Needs Assistance Bed Mobility: Supine to Sit     Supine to sit: Min assist;HOB elevated Sit to supine: Min guard   General bed mobility comments: assist for R LE over EOB  Transfers Overall transfer level: Needs assistance Equipment used: Rolling walker (2 wheeled) Transfers: Sit to/from [facility] Sit to Stand: Min assist Stand pivot transfers: Min assist       General transfer comment: verbal cues for UE and LE positioning, assist to rise and steady        ADL either performed or assessed with clinical judgement   ADL Overall ADL's : Needs assistance/impaired Eating/Feeding: Set up;Sitting   Grooming: Set up;Sitting   Upper Body Bathing: Set up;Sitting   Lower Body Bathing: Moderate assistance;Sit to/from stand;Cueing for safety;Cueing for sequencing   Upper Body Dressing : Set up;Sitting   Lower Body Dressing: Moderate assistance;Sit to/from stand;Cueing for safety;Cueing for sequencing   Toilet Transfer: Minimal assistance;Comfort height toilet;RW   Toileting- Clothing Manipulation and Hygiene: Minimal assistance;Sit to/from stand                             Pertinent Vitals/Pain Pain Assessment: 0-10 Pain Score: 7  Pain Location: right knee Pain Descriptors / Indicators: Discomfort;Sore Pain Intervention(s): Limited activity within patient's tolerance;Monitored during session;Repositioned;Ice applied     Hand Dominance     Extremity/Trunk Assessment Upper Extremity Assessment Upper Extremity Assessment: Generalized weakness           Communication Communication Communication: No difficulties   Cognition Arousal/Alertness: Awake/alert Behavior During Therapy: WFL for tasks assessed/performed Overall Cognitive Status: Within Functional Limits for tasks assessed                                     General Comments       Exercises Total Joint Exercises Ankle Circles/Pumps: AROM;Both;10 reps Quad Sets: 10 reps;AROM;Both Short Arc Quad: AAROM;10 reps;Right Heel Slides: AAROM;10 reps;Right Hip ABduction/ADduction: AAROM;10 reps;Right Straight Leg Raises: Right;10 reps;AAROM Goniometric ROM: approximately 25* AAROM R knee flexion, limited due to pain   Shoulder Instructions      Home Living Family/patient expects to be discharged to:: Private residence Living Arrangements: Alone Available Help at Discharge: Family(sister) Type of Home: House Home Access: Stairs to enter [employer] of Steps: 1 and 1   Home Layout: One level     Bathroom Shower/Tub: Occupational psychologist: Handicapped height     Home Equipment: Bedside commode;Walker - 2 wheels;Cane - single point;Crutches;Wheelchair - manual  Prior Functioning/Environment Level of Independence: Independent                 OT Problem List: Decreased strength;Decreased knowledge of use of DME or AE      OT Treatment/Interventions: Self-care/ADL training;DME and/or AE instruction;Patient/family education    OT Goals(Current goals can be found in the care plan section) Acute Rehab OT  Goals Patient Stated Goal: less pain with walking OT Goal Formulation: With patient Time For Goal Achievement: 07/27/17 Potential to Achieve Goals: Good ADL Goals Pt Will Perform Lower Body Dressing: with supervision;sit to/from stand Pt Will Transfer to Toilet: with supervision;ambulating;regular height toilet Pt Will Perform Toileting - Clothing Manipulation and hygiene: with supervision;sit to/from stand Pt Will Perform Tub/Shower Transfer: Shower transfer;with supervision  OT Frequency: Min 2X/week              AM-PAC PT "6 Clicks" Daily Activity     Outcome Measure Help from another person eating meals?: None Help from another person taking care of personal grooming?: A Little Help from another person toileting, which includes using toliet, bedpan, or urinal?: A Little Help from another person bathing (including washing, rinsing, drying)?: A Little Help from another person to put on and taking off regular upper body clothing?: None Help from another person to put on and taking off regular lower body clothing?: A Little 6 Click Score: 20   End of Session Equipment Utilized During Treatment: Rolling walker Nurse Communication: Mobility status  Activity Tolerance: Patient tolerated treatment well Patient left: in chair;with call bell/phone within reach  OT Visit Diagnosis: Unsteadiness on feet (R26.81);Pain                Time: 6433-2951 OT Time Calculation (min): 16 min Charges:  OT General Charges $OT Visit: 1 Visit OT Evaluation $OT Eval Low Complexity: 1 Low G-Codes:     Kari Baars, [place]  Payton Mccallum D 07/20/2017, 4:29 PM

## 2017-07-20 NOTE — Progress Notes (Signed)
Physical Therapy Treatment Patient Details Name: Susan Jenkins MRN: [id number] DOB: 1945/11/07 Today's Date: 07/20/2017    History of Present Illness s/p R TKA    PT Comments    Pt reports change in pain meds and able to tolerate mobility however limited this morning due to faint feeling/dizziness.  Pt also performed LE exercises.    Follow Up Recommendations  Follow surgeon's recommendation for DC plan and follow-up therapies     Equipment Recommendations  3in1 (PT)    Recommendations for Other Services       Precautions / Restrictions Precautions Precautions: Knee;Fall Required Braces or Orthoses: Knee Immobilizer - Right Knee Immobilizer - Right: Discontinue once straight leg raise with < 10 degree lag Restrictions RLE Weight Bearing: Weight bearing as tolerated    Mobility  Bed Mobility Overal bed mobility: Needs Assistance Bed Mobility: Supine to Sit     Supine to sit: Min assist;HOB elevated     General bed mobility comments: assist for R LE over EOB  Transfers Overall transfer level: Needs assistance Equipment used: Rolling walker (2 wheeled) Transfers: Sit to/from Stand Sit to Stand: Min assist         General transfer comment: verbal cues for UE and LE positioning, assist to rise and steady  Ambulation/Gait Ambulation/Gait assistance: Min guard;Min assist Ambulation Distance (Feet): 20 Feet(x2) Assistive device: Rolling walker (2 wheeled) Gait Pattern/deviations: Step-to pattern;Decreased stance time - right;Antalgic     General Gait Details: verbal cues for sequence, RW positioning, posture, required seated rest break, 20 feet x2, reports faintness/dizziness limiting mobility; vitals: 137/44 mmHg, 95% room air, 54 bpm   Stairs            Wheelchair Mobility    Modified Rankin (Stroke Patients Only)       Balance                                            Cognition Arousal/Alertness: Awake/alert Behavior  During Therapy: WFL for tasks assessed/performed Overall Cognitive Status: Within Functional Limits for tasks assessed                                        Exercises Total Joint Exercises Ankle Circles/Pumps: AROM;Both;10 reps Quad Sets: 10 reps;AROM;Both Short Arc Quad: AAROM;10 reps;Right Heel Slides: AAROM;10 reps;Right Hip ABduction/ADduction: AAROM;10 reps;Right Straight Leg Raises: Right;10 reps;AAROM Goniometric ROM: approximately 25* AAROM R knee flexion, limited due to pain    General Comments        Pertinent Vitals/Pain Pain Assessment: 0-10 Pain Score: 5  Pain Location: right knee Pain Descriptors / Indicators: Discomfort;Grimacing;Guarding Pain Intervention(s): Limited activity within patient's tolerance;Repositioned;Monitored during session;Ice applied;Premedicated before session    Home Living                      Prior Function            PT Goals (current goals can now be found in the care plan section) Progress towards PT goals: Progressing toward goals    Frequency    7X/week      PT Plan Current plan remains appropriate    Co-evaluation              AM-PAC PT "6 Clicks" Daily Activity  Outcome Measure  Difficulty turning over in bed (including adjusting bedclothes, sheets and blankets)?: Unable Difficulty moving from lying on back to sitting on the side of the bed? : Unable Difficulty sitting down on and standing up from a chair with arms (e.g., wheelchair, bedside commode, etc,.)?: Unable Help needed moving to and from a bed to chair (including a wheelchair)?: A Little Help needed walking in hospital room?: A Little Help needed climbing 3-5 steps with a railing? : A Lot 6 Click Score: 11    End of Session Equipment Utilized During Treatment: Gait belt Activity Tolerance: Patient limited by fatigue Patient left: with call bell/phone within reach;in chair;with family/visitor present   PT Visit  Diagnosis: Difficulty in walking, not elsewhere classified (R26.2)     Time: 2979-8921 PT Time Calculation (min) (ACUTE ONLY): 32 min  Charges:  $Gait Training: 8-22 mins $Therapeutic Exercise: 8-22 mins                    G Codes:       Carmelia Bake, PT, DPT 07/20/2017 Pager: [phone]  York Ram E 07/20/2017, 1:45 PM

## 2017-07-21 LAB — CBC
HEMATOCRIT: 31.6 % — AB (ref 36.0–46.0)
HEMOGLOBIN: 11 g/dL — AB (ref 12.0–15.0)
MCH: 34.5 pg — ABNORMAL HIGH (ref 26.0–34.0)
MCHC: 34.8 g/dL (ref 30.0–36.0)
MCV: 99.1 fL (ref 78.0–100.0)
Platelets: 224 10*3/uL (ref 150–400)
RBC: 3.19 MIL/uL — ABNORMAL LOW (ref 3.87–5.11)
RDW: 13 % (ref 11.5–15.5)
WBC: 14.2 10*3/uL — AB (ref 4.0–10.5)

## 2017-07-21 MED ORDER — HYDROMORPHONE HCL 2 MG PO TABS: 2.0000 mg | ORAL_TABLET | ORAL | 0 refills | Status: DC | PRN

## 2017-07-21 MED ORDER — METHOCARBAMOL 500 MG PO TABS: 500.0000 mg | ORAL_TABLET | Freq: Four times a day (QID) | ORAL | 0 refills | Status: DC | PRN

## 2017-07-21 MED ORDER — RIVAROXABAN 10 MG PO TABS: 10.0000 mg | ORAL_TABLET | Freq: Every day | ORAL | 0 refills | Status: DC

## 2017-07-21 NOTE — Progress Notes (Signed)
Occupational Therapy Treatment Patient Details Name: Susan Jenkins MRN: [id number] DOB: Sep 30, 1945 Today's Date: 07/21/2017    History of present illness s/p R TKA   OT comments  OT education complete  Follow Up Recommendations  No OT follow up          Precautions / Restrictions Precautions Precautions: Knee;Fall Restrictions Weight Bearing Restrictions: No RLE Weight Bearing: Weight bearing as tolerated       Mobility Bed Mobility Overal bed mobility: Needs Assistance Bed Mobility: Supine to Sit     Supine to sit: Min assist     General bed mobility comments: increased time and VC  Transfers Overall transfer level: Needs assistance Equipment used: Rolling walker (2 wheeled) Transfers: Sit to/from [facility] Sit to Stand: Min guard Stand pivot transfers: Min guard                ADL either performed or assessed with clinical judgement   ADL Overall ADL's : Needs assistance/impaired                 Upper Body Dressing : Set up;Sitting   Lower Body Dressing: Minimal assistance;Sit to/from stand;Cueing for safety;Cueing for sequencing;Cueing for compensatory techniques;With caregiver independent assisting   Toilet Transfer: Min guard;RW;Comfort height toilet;Stand-pivot   Toileting- Water quality scientist and Hygiene: Min guard;Sit to/from Nurse, children's Details (indicate cue type and reason): verbalized safety Functional mobility during ADLs: Min guard General ADL Comments: caregiver will A as needed               Cognition Arousal/Alertness: Awake/alert Behavior During Therapy: WFL for tasks assessed/performed Overall Cognitive Status: Within Functional Limits for tasks assessed                                                     Pertinent Vitals/ Pain       Pain Score: 5  Pain Descriptors / Indicators: Discomfort;Sore Pain Intervention(s): Limited activity within patient's  tolerance;Monitored during session  Home Living                                              Frequency  Min 2X/week        Progress Toward Goals  OT Goals(current goals can now be found in the care plan section)  Progress towards OT goals: Progressing toward goals     Plan Discharge plan remains appropriate    Co-evaluation                 AM-PAC PT "6 Clicks" Daily Activity     Outcome Measure   Help from another person eating meals?: None Help from another person taking care of personal grooming?: A Little Help from another person toileting, which includes using toliet, bedpan, or urinal?: A Little Help from another person bathing (including washing, rinsing, drying)?: A Little Help from another person to put on and taking off regular upper body clothing?: None Help from another person to put on and taking off regular lower body clothing?: A Little 6 Click Score: 20    End of Session Equipment Utilized During Treatment: Rolling walker CPM Right Knee CPM Right Knee: Off  OT Visit Diagnosis: Unsteadiness on  feet (R26.81);Pain   Activity Tolerance Patient tolerated treatment well   Patient Left in chair;with call bell/phone within reach   Nurse Communication Mobility status        Time: 0370-4888 OT Time Calculation (min): 26 min  Charges: OT General Charges $OT Visit: 1 Visit OT Treatments $Self Care/Home Management : 23-37 mins  Annetta South, [place]   Payton Mccallum D 07/21/2017, 11:24 AM

## 2017-07-21 NOTE — Care Management Important Message (Signed)
Important Message  Patient Details  Name: Susan Jenkins MRN: [id number] Date of Birth: [birth year]   Medicare Important Message Given:  Yes    Kerin Salen 07/21/2017, 10:27 [place] Message  Patient Details  Name: Susan Jenkins MRN: [id number] Date of Birth: [birth year]   Medicare Important Message Given:  Yes    Kerin Salen 07/21/2017, 10:27 AM

## 2017-07-21 NOTE — Progress Notes (Signed)
Physical Therapy Treatment Patient Details Name: Susan Jenkins MRN: [id number] DOB: [birth year] Today's Date: 07/21/2017    History of Present Illness s/p R TKA    PT Comments    Caregiver and pt donned KI with verbal cues.  Pt ambulated in hallway and practiced safe step technique.  Pt reports understanding.  Pt fatigued from session however agreeable to perform exercises once home.  Pt has HEP handout.  Pt feels ready for d/c home today.   Follow Up Recommendations  Follow surgeon's recommendation for DC plan and follow-up therapies     Equipment Recommendations  3in1 (PT)    Recommendations for Other Services       Precautions / Restrictions Precautions Precautions: Knee;Fall Required Braces or Orthoses: Knee Immobilizer - Right Knee Immobilizer - Right: Discontinue once straight leg raise with < 10 degree lag Restrictions Weight Bearing Restrictions: No RLE Weight Bearing: Weight bearing as tolerated    Mobility  Bed Mobility Overal bed mobility: Needs Assistance Bed Mobility: Sit to Supine     Supine to sit: Min assist Sit to supine: Min assist   General bed mobility comments: verbal cues for use of leg lifter to self assist R LE however pt still required assist, caregiver educated on how to safely assist  Transfers Overall transfer level: Needs assistance Equipment used: Rolling walker (2 wheeled) Transfers: Sit to/from Stand Sit to Stand: Min guard Stand pivot transfers: Min guard       General transfer comment: verbal cues for UE and LE positioning  Ambulation/Gait Ambulation/Gait assistance: Min guard Ambulation Distance (Feet): 80 Feet Assistive device: Rolling walker (2 wheeled) Gait Pattern/deviations: Step-to pattern;Decreased stance time - right;Antalgic Gait velocity: decr   General Gait Details: verbal cues for sequence, RW positioning, posture   Stairs Stairs: Yes   Stair Management: Step to pattern;Backwards;Forwards;With  walker Number of Stairs: 1 General stair comments: pt performed one step twice both forwards and backwards with RW, pt reports understanding and sequence  Wheelchair Mobility    Modified Rankin (Stroke Patients Only)       Balance                                            Cognition Arousal/Alertness: Awake/alert Behavior During Therapy: WFL for tasks assessed/performed Overall Cognitive Status: Within Functional Limits for tasks assessed                                        Exercises      General Comments        Pertinent Vitals/Pain Pain Assessment: 0-10 Pain Score: 5  Pain Location: right knee Pain Descriptors / Indicators: Discomfort;Sore Pain Intervention(s): Limited activity within patient's tolerance;Monitored during session;Repositioned    Home Living                      Prior Function            PT Goals (current goals can now be found in the care plan section) Progress towards PT goals: Progressing toward goals    Frequency    7X/week      PT Plan Current plan remains appropriate    Co-evaluation              AM-PAC PT "6 Clicks" Daily  Activity  Outcome Measure  Difficulty turning over in bed (including adjusting bedclothes, sheets and blankets)?: A Lot Difficulty moving from lying on back to sitting on the side of the bed? : Unable Difficulty sitting down on and standing up from a chair with arms (e.g., wheelchair, bedside commode, etc,.)?: Unable Help needed moving to and from a bed to chair (including a wheelchair)?: A Little Help needed walking in hospital room?: A Little Help needed climbing 3-5 steps with a railing? : A Little 6 Click Score: 13    End of Session   Activity Tolerance: Patient tolerated treatment well Patient left: with call bell/phone within reach;with family/visitor present;in bed Nurse Communication: Mobility status PT Visit Diagnosis: Difficulty in walking,  not elsewhere classified (R26.2)     Time: 2951-8841 PT Time Calculation (min) (ACUTE ONLY): 24 min  Charges:  $Gait Training: 23-37 mins                    G Codes:       Carmelia Bake, PT, DPT 07/21/2017 Pager: [phone]  York Ram E 07/21/2017, 12:34 PM

## 2017-07-21 NOTE — Progress Notes (Signed)
   Subjective: 3 Days Post-Op Procedure(s) (LRB): RIGHT TOTAL KNEE ARTHROPLASTY (Right) Patient reports pain as mild.   Patient seen in rounds by Dr. [name]. Patient is well, and has had no acute complaints or problems Patient is ready to go home today  Objective: Vital signs in last 24 hours: Temp:  [98.4 F (36.9 C)-100 F (37.8 C)] 98.4 F (36.9 C) (03/21 0500) Pulse Rate:  [61-69] 61 (03/21 0500) Resp:  [16-20] 18 (03/21 0500) BP: (157-171)/(52-53) 157/52 (03/21 0500) SpO2:  [95 %-96 %] 96 % (03/21 0500)  Intake/Output from previous day:  Intake/Output Summary (Last 24 hours) at 07/21/2017 0813 Last data filed at 07/21/2017 0517 Gross per 24 hour  Intake 1110 ml  Output 2300 ml  Net -1190 ml    Intake/Output this shift: No intake/output data recorded.  Labs: Recent Labs    07/19/17 0605 07/20/17 0602 07/21/17 0552  HGB 11.8* 11.4* 11.0*   Recent Labs    07/20/17 0602 07/21/17 0552  WBC 16.6* 14.2*  RBC 3.28* 3.19*  HCT 33.1* 31.6*  PLT 255 224   Recent Labs    07/19/17 0605 07/20/17 0602  NA 135 134*  K 3.9 3.4*  CL 99* 96*  CO2 27 27  BUN 15 19  CREATININE 0.70 0.83  GLUCOSE 119* 149*  CALCIUM 9.0 9.1   No results for input(s): LABPT, INR in the last 72 hours.  EXAM: General - Patient is Alert, Appropriate and Oriented Extremity - Neurovascular intact Sensation intact distally Intact pulses distally Dorsiflexion/Plantar flexion intact Incision - clean, dry Motor Function - intact, moving foot and toes well on exam.   Assessment/Plan: 3 Days Post-Op Procedure(s) (LRB): RIGHT TOTAL KNEE ARTHROPLASTY (Right) Procedure(s) (LRB): RIGHT TOTAL KNEE ARTHROPLASTY (Right) Past Medical History:  Diagnosis Date  . Complication of anesthesia   . Depression   . Glaucoma   . Hemorrhoids   . HOCM (hypertrophic obstructive cardiomyopathy) ([place])   . Hyperlipidemia   . Hypertension   . PONV (postoperative nausea and vomiting)   . Pulmonary  embolus ([place]) 1971   following surgery  . Vitamin D deficiency    Principal Problem:   OA (osteoarthritis) of knee  Estimated body mass index is 37.45 kg/m as calculated from the following:   Height as of this encounter: 5\' 6"  (1.676 m).   Weight as of this encounter: 105.2 kg (232 lb). Up with therapy Diet - Cardiac diet Follow up - in 2 weeks Activity - WBAT Disposition - Home Condition Upon Discharge - Stable D/C Meds - See DC Summary DVT Prophylaxis - [place], PA-C Orthopaedic Surgery 07/21/2017, 8:13 AM

## 2017-07-21 NOTE — Progress Notes (Signed)
RN reviewed discharge instructions with patient and family. All questions answered.   Paperwork and prescriptions given; including yellow narcotic resource handout.   NT rolled patient down with all belongings to family car.

## 2017-07-22 DIAGNOSIS — M25561 Pain in right knee: Secondary | ICD-10-CM | POA: Diagnosis not present

## 2017-07-25 DIAGNOSIS — M25561 Pain in right knee: Secondary | ICD-10-CM | POA: Diagnosis not present

## 2017-07-27 DIAGNOSIS — M25561 Pain in right knee: Secondary | ICD-10-CM | POA: Diagnosis not present

## 2017-07-29 DIAGNOSIS — M25561 Pain in right knee: Secondary | ICD-10-CM | POA: Diagnosis not present

## 2017-08-01 DIAGNOSIS — M25561 Pain in right knee: Secondary | ICD-10-CM | POA: Diagnosis not present

## 2017-08-03 DIAGNOSIS — M25561 Pain in right knee: Secondary | ICD-10-CM | POA: Diagnosis not present

## 2017-08-05 DIAGNOSIS — M25561 Pain in right knee: Secondary | ICD-10-CM | POA: Diagnosis not present

## 2017-08-08 DIAGNOSIS — M25561 Pain in right knee: Secondary | ICD-10-CM | POA: Diagnosis not present

## 2017-08-10 DIAGNOSIS — M25561 Pain in right knee: Secondary | ICD-10-CM | POA: Diagnosis not present

## 2017-08-12 ENCOUNTER — Other Ambulatory Visit: Payer: Self-pay | Admitting: Internal Medicine

## 2017-08-12 DIAGNOSIS — M25561 Pain in right knee: Secondary | ICD-10-CM | POA: Diagnosis not present

## 2017-08-15 DIAGNOSIS — M25561 Pain in right knee: Secondary | ICD-10-CM | POA: Diagnosis not present

## 2017-08-17 DIAGNOSIS — M25561 Pain in right knee: Secondary | ICD-10-CM | POA: Diagnosis not present

## 2017-08-23 DIAGNOSIS — Z96651 Presence of right artificial knee joint: Secondary | ICD-10-CM | POA: Diagnosis not present

## 2017-08-23 DIAGNOSIS — M25561 Pain in right knee: Secondary | ICD-10-CM | POA: Diagnosis not present

## 2017-08-23 DIAGNOSIS — Z471 Aftercare following joint replacement surgery: Secondary | ICD-10-CM | POA: Diagnosis not present

## 2017-08-26 DIAGNOSIS — M25561 Pain in right knee: Secondary | ICD-10-CM | POA: Diagnosis not present

## 2017-09-08 ENCOUNTER — Other Ambulatory Visit: Payer: Self-pay | Admitting: Internal Medicine

## 2017-09-28 ENCOUNTER — Ambulatory Visit (INDEPENDENT_AMBULATORY_CARE_PROVIDER_SITE_OTHER): Payer: PPO | Admitting: Family Medicine

## 2017-09-28 ENCOUNTER — Encounter: Payer: Self-pay | Admitting: Family Medicine

## 2017-09-28 VITALS — BP 100/78 | HR 63 | Temp 98.5°F | Ht 66.25 in | Wt 228.8 lb

## 2017-09-28 DIAGNOSIS — Z0000 Encounter for general adult medical examination without abnormal findings: Secondary | ICD-10-CM

## 2017-09-28 DIAGNOSIS — I10 Essential (primary) hypertension: Secondary | ICD-10-CM

## 2017-09-28 DIAGNOSIS — Z96651 Presence of right artificial knee joint: Secondary | ICD-10-CM

## 2017-09-28 LAB — BASIC METABOLIC PANEL
BUN: 16 mg/dL (ref 6–23)
CALCIUM: 10 mg/dL (ref 8.4–10.5)
CO2: 32 mEq/L (ref 19–32)
Chloride: 96 mEq/L (ref 96–112)
Creatinine, Ser: 0.73 mg/dL (ref 0.40–1.20)
GFR: 83.88 mL/min (ref >60.00)
Glucose, Bld: 126 mg/dL — ABNORMAL HIGH (ref 70–99)
Potassium: 3.7 mEq/L (ref 3.5–5.1)
SODIUM: 136 meq/L (ref 135–145)

## 2017-09-28 MED ORDER — HYDROCHLOROTHIAZIDE 25 MG PO TABS: 25.0000 mg | ORAL_TABLET | Freq: Every day | ORAL | 2 refills | Status: DC

## 2017-09-28 NOTE — Progress Notes (Signed)
Subjective:   Susan Jenkins is a 72 y.o. female who presents for an Initial Medicare Annual Wellness Visit.   Cardiac Risk Factors include: advanced age (>19men, >86 women);dyslipidemia;family history of premature cardiovascular disease;hypertension;obesity (BMI >30kg/m2)  Educated regarding s/s of heart disease in women  Reports health as very good  Knee replacement and this is mostl likely glucose was elevated  Has a sister pre -diabetes   Diet BMI 36 States she cooks and goes out to eat   Exercise Plays senior softball - the [employer]  Water aerobics x 2 per week    There are no preventive care reminders to display for this patient.  Colonoscopy 11/2015 - repeat 5 years Father had  Colon cancer  Mammogram annually Jan 2018;  Has this at [facility]  08/2016 - very normal  Pap in 2015   Goes to the dermatolgist for skin checks [facility]     Objective:    Today's Vitals   09/28/17 1058  BP: 100/78  Pulse: 63  Temp: 98.5 F (36.9 C)  TempSrc: Oral  Weight: 228 lb 12.8 oz (103.8 kg)  Height: 5' 6.25" (1.683 m)   Body mass index is 36.65 kg/m.  Advanced Directives 09/28/2017 07/18/2017 07/13/2017 07/04/2017 09/29/2015 09/29/2015  Does Patient Have a Medical Advance Directive? Yes Yes Yes Yes Yes Yes  Type of Advance Directive - [place];Living will [place];Living will [place];Living will [place];Living will [place];Living will  Does patient want to make changes to medical advance directive? - No - Patient declined No - Patient declined - No - Patient declined -  Copy of [place] in Chart? - - No - copy requested - No - copy requested -  Would patient like information on creating a medical advance directive? - - - - No - patient declined information No - patient declined information    Current Medications (verified) Outpatient Encounter  Medications as of 09/28/2017  Medication Sig  . amLODipine (NORVASC) 10 MG tablet TAKE 1 TABLET BY MOUTH EVERY DAY  . aspirin 81 MG tablet Take 81 mg by mouth daily.  . hydrochlorothiazide (HYDRODIURIL) 25 MG tablet Take 1 tablet (25 mg total) by mouth daily.  Marland Kitchen ibuprofen (ADVIL,MOTRIN) 200 MG tablet Take 200 mg by mouth as needed.  . Loratadine 10 MG CAPS Take by mouth daily.  Marland Kitchen MELATONIN PO Take 3 mg by mouth daily.  . Misc Natural Products (GLUCOSAMINE CHONDROITIN ADV PO) Take by mouth 2 (two) times daily.  . Multiple Vitamins-Minerals (MULTIVITAMIN ADULT PO) Take by mouth daily.  Mckinley Jewel Dimesylate (RHOPRESSA OP) Apply to eye.  . Omega-3 Fatty Acids (FISH OIL PO) Take by mouth daily.  Marland Kitchen OVER THE COUNTER MEDICATION Calcium carbonate, vit D, min  . Probiotic Product (PROBIOTIC PO) Take by mouth at bedtime.  . pyridOXINE (VITAMIN B-6) 50 MG tablet Take 50 mg by mouth daily.  . rosuvastatin (CRESTOR) 10 MG tablet TAKE 1 TABLET BY MOUTH EVERY DAY (Patient taking differently: Take 1 tablet by mouth Monday Wednesday and Friday)  . timolol (TIMOPTIC) 0.5 % ophthalmic solution Place 1 drop into both eyes 2 (two) times daily.   Marland Kitchen venlafaxine XR (EFFEXOR-XR) 75 MG 24 hr capsule TAKE ONE CAPSULE BY MOUTH EVERY DAY  . [DISCONTINUED] hydrochlorothiazide (HYDRODIURIL) 25 MG tablet Take 1 tablet (25 mg total) by mouth daily.  . [DISCONTINUED] HYDROmorphone (DILAUDID) 2 MG tablet Take 1-2 tablets (  2-4 mg total) by mouth every 4 (four) hours as needed for moderate pain or severe pain.  . [DISCONTINUED] latanoprost (XALATAN) 0.005 % ophthalmic solution Place 1 drop into both eyes at bedtime.  . [DISCONTINUED] methocarbamol (ROBAXIN) 500 MG tablet Take 1 tablet (500 mg total) by mouth every 6 (six) hours as needed for muscle spasms.  . [DISCONTINUED] ondansetron (ZOFRAN ODT) 4 MG disintegrating tablet Take 1 tablet (4 mg total) by mouth every 8 (eight) hours as needed for nausea or vomiting.  .  [DISCONTINUED] rivaroxaban (XARELTO) 10 MG TABS tablet Take 1 tablet (10 mg total) by mouth daily with breakfast. Take Xarelto for two and a half more weeks following discharge from the hospital, then discontinue Xarelto. Once the patient has completed the Xarelto, they may resume the 81 mg Aspirin.   No facility-administered encounter medications on file as of 09/28/2017.     Allergies (verified) Amoxicillin-pot clavulanate; Augmentin [amoxicillin-pot clavulanate]; and Nickel   History: Past Medical History:  Diagnosis Date  . Complication of anesthesia   . Depression   . Glaucoma   . Hemorrhoids   . HOCM (hypertrophic obstructive cardiomyopathy) ([place])   . Hyperlipidemia   . Hypertension   . PONV (postoperative nausea and vomiting)   . Pulmonary embolus ([place]) 1971   following surgery  . Vitamin D deficiency    Past Surgical History:  Procedure Laterality Date  . ANKLE SURGERY    . BACK SURGERY    . KNEE SURGERY    . NASAL SEPTUM SURGERY    . 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 Dementia Mother   . Cancer Father   . Heart failure Father   . Allergic rhinitis Neg Hx   . Asthma Neg Hx   . Eczema Neg Hx   . Urticaria Neg Hx   . Angioedema Neg Hx    Social History   Socioeconomic History  . Marital status: Single    Spouse name: Not on file  . Number of children: Not on file  . Years of education: Not on file  . Highest education level: Not on file  Occupational History  . Not on file  Social Needs  . Financial resource strain: Not on file  . Food insecurity:    Worry: Not on file    Inability: Not on file  . Transportation needs:    Medical: Not on file    Non-medical: Not on file  Tobacco Use  . Smoking status: Former Smoker    Types: Cigarettes    Last attempt to  quit: 05/03/1980    Years since quitting: 37.4  . Smokeless tobacco: Never Used  . Tobacco comment: a .50 a pack;   Substance and Sexual Activity  . Alcohol use: No  . Drug use: No  . Sexual activity: Never  Lifestyle  . Physical activity:    Days per week: Not on file    Minutes per session: Not on file  . Stress: Not on file  Relationships  . Social connections:    Talks on phone: Not on file    Gets together: Not on file    Attends religious service: Not on file    Active member of club or organization: Not on file    Attends meetings of clubs or organizations: Not on  file    Relationship status: Not on file  Other Topics Concern  . Not on file  Social History Narrative  . Not on file   Tobacco Counseling Counseling given: Yes Comment: a .50 a pack;    Clinical Intake:    \Activities of Daily Living In your present state of health, do you have any difficulty performing the following activities: 09/28/2017 07/18/2017  Hearing? N N  Vision? Y N  Comment followed for glaucoma -  Difficulty concentrating or making decisions? N N  Walking or climbing stairs? N N  Comment - -  Dressing or bathing? N N  Doing errands, shopping? N N  Preparing Food and eating ? N -  Using the Toilet? N -  In the past six months, have you accidently leaked urine? N -  Do you have problems with loss of bowel control? N -  Managing your Medications? N -  Managing your Finances? N -  Housekeeping or managing your Housekeeping? N -  Some recent data might be hidden     Immunizations and Health Maintenance Immunization History  Administered Date(s) Administered  . Hepatitis A 06/03/2010, 04/14/2011  . Hepatitis B 06/03/2010, 04/14/2011, 09/15/2011  . Influenza Split 03/18/2015  . Influenza Whole 02/20/2013  . Influenza-Unspecified 02/14/2014, 01/29/2016, 03/05/2017  . Pneumococcal Conjugate-13 03/06/2014  . Pneumococcal Polysaccharide-23 04/01/2015  . Tdap 06/03/2010, 07/04/2017  .  Zoster 01/01/2009   There are no preventive care reminders to display for this patient.  Patient Care Team: Marletta Lor, MD as PCP - General (Internal Medicine)  Indicate any recent Medical Services you may have received from other than [facility] providers in the past year (date may be approximate).    Assessment:   This is a routine wellness examination for Susan Jenkins.  Hearing/Vision screen Hearing Screening Comments: No hearing issues  Vision Screening Comments: Vision checks q 6 months Has glaucoma  [facility]   Dietary issues and exercise activities discussed: Current Exercise Habits: Structured exercise class, Time (Minutes): 60, Frequency (Times/Week): 5, Weekly Exercise (Minutes/Week): 300, Intensity: Moderate  Goals    . Patient Stated     Will continue to play sports and remain socially active       Depression Screen PHQ 2/9 Scores 09/28/2017 09/28/2017 09/24/2015 04/01/2015  PHQ - 2 Score 0 0 0 0  PHQ- 9 Score - 2 - -    Fall Risk Fall Risk  09/28/2017 09/24/2015 04/01/2015 05/21/2014 03/06/2014  Falls in the past year? Yes No No No No  Comment fell in March in the parking lot; Dr. Al Corpus office gyn - - - -  Number falls in past yr: 1 - - - -     Cognitive Function:  mother had Alz on her side of the family  Ad8 score reviewed for issues:  Issues making decisions:  Less interest in hobbies / activities:  Repeats questions, stories (family complaining):  Trouble using ordinary gadgets (microwave, computer, phone):  Forgets the month or year:   Mismanaging finances:   Remembering appts:  Daily problems with thinking and/or memory: Ad8 score is=0          Screening Tests Health Maintenance  Topic Date Due  . Hepatitis C Screening  09/29/2018 (Originally Mar 07, 1946)  . INFLUENZA VACCINE  12/01/2017  . MAMMOGRAM  01/31/2018  . COLONOSCOPY  11/23/2025  . TETANUS/TDAP  07/05/2027  . DEXA SCAN  Completed  . PNA vac Low Risk Adult  Completed  Plan:     PCP Notes   Health Maintenance She is on a waiting list for shingrix  [name] to call [facility] to request her mammogram report this year  Information shared by Dr. Volanda Napoleon regarding her trip to [place]  Has been to the public health dept for vaccine updates   Educated regarding the hep c   Very healthy; doing water aerobics and women's softball. Enjoying her life   Abnormal Screens  None noted  Referrals  none  Patient concerns; None   Nurse Concerns; As noted  Next PCP apt In to see today     I have personally reviewed and noted the following in the patient's chart:   . Medical and social history . Use of alcohol, tobacco or illicit drugs  . Current medications and supplements . Functional ability and status . Nutritional status . Physical activity . Advanced directives . List of other physicians . Hospitalizations, surgeries, and ER visits in previous 12 months . Vitals . Screenings to include cognitive, depression, and falls . Referrals and appointments  In addition, I have reviewed and discussed with patient certain preventive protocols, quality metrics, and best practice recommendations. A written personalized care plan for preventive services as well as general preventive health recommendations were provided to patient.     Wynetta Fines, RN   09/28/2017

## 2017-09-28 NOTE — Progress Notes (Signed)
Medical screening examination/treatment was performed by qualified clinical staff member and as supervising physician I was immediately available for consultation/collaboration. I have reviewed documentation and agree with assessment and plan.  Sabrinna Yearwood R Devone Tousley, MD  

## 2017-09-28 NOTE — Progress Notes (Signed)
Subjective:    Patient ID: Susan Jenkins, female    DOB: 1945/11/27, 72 y.o.   MRN: [id number]  Chief Complaint  Patient presents with  . Transitions Of Care    HPI Patient was seen today for transfer of care. Pt was previously seen by Dr. Raliegh Ip.    Recent R TKR 2/2 OA.  Patient states she has been doing well since the surgery.  Patient plans to participate in the senior games held in [place] in June.  Patient plays softball.  She states her surgeon told her that she should not do too much morning.  Patient can pinch hit and jog to first base.  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  HTN: Patient endorses taking hydrochlorothiazide 25 mg daily and Norvasc 10 mg daily for her blood pressure.  Patient is not checking her BP at home.  Patient denies headaches, changes in vision, chest pain, nausea vomiting, dizziness.  Past Medical History:  Diagnosis Date  . Complication of anesthesia   . Depression   . Glaucoma   . Hemorrhoids   . HOCM (hypertrophic obstructive cardiomyopathy) ([place])   . Hyperlipidemia   . Hypertension   . PONV (postoperative nausea and vomiting)   . Pulmonary embolus ([place]) 1971   following surgery  . Vitamin D deficiency     Allergies  Allergen Reactions  . Amoxicillin-Pot Clavulanate Anaphylaxis    Has patient had a PCN reaction causing immediate rash, facial/tongue/throat swelling, SOB or lightheadedness with hypotension: Yes Has patient had a PCN reaction causing severe rash involving mucus membranes or skin necrosis: Yes Has patient had a PCN reaction that required hospitalization Yes Has patient had a PCN reaction occurring within the last 10 years: No If all of the above answers are "NO", then may proceed with Cephalosporin use.  . Augmentin [Amoxicillin-Pot Clavulanate]   . Nickel Rash    ROS General: Denies fever, chills, night sweats, changes in weight, changes in appetite HEENT: Denies headaches, ear pain, changes in vision,  rhinorrhea, sore throat CV: Denies CP, palpitations, SOB, orthopnea Pulm: Denies SOB, cough, wheezing GI: Denies abdominal pain, nausea, vomiting, diarrhea, constipation GU: Denies dysuria, hematuria, frequency, vaginal discharge Msk: Denies muscle cramps, joint pains Neuro: Denies weakness, numbness, tingling Skin: Denies rashes, bruising Psych: Denies depression, anxiety, hallucinations     Objective:    Blood pressure 100/78, pulse 63, temperature 98.5 F (36.9 C), temperature source Oral, height 5' 6.25" (1.683 m), weight 228 lb 12.8 oz (103.8 kg).   Gen. Pleasant, well-nourished, in no distress, normal affect   HEENT: [place]/AT, face symmetric, no scleral icterus, PERRLA, nares patent without drainage, pharynx without erythema or exudate. Neck: No JVD, no thyromegaly, no carotid bruits Lungs: no accessory muscle use, CTAB, no wheezes or rales Cardiovascular: RRR, no m/r/g, no peripheral edema Abdomen: BS present, soft, NT/ND, no hepatosplenomegaly. Musculoskeletal: No deformities, no cyanosis or clubbing, normal tone Neuro:  A&Ox3, CN II-XII intact, normal gait Skin:  Warm, no lesions/ rash.  Well-healed surgical incision of right knee    Wt Readings from Last 3 Encounters:  09/28/17 228 lb 12.8 oz (103.8 kg)  07/18/17 232 lb (105.2 kg)  07/13/17 232 lb 8 oz (105.5 kg)    Lab Results  Component Value Date   WBC 14.2 (H) 07/21/2017   HGB 11.0 (L) 07/21/2017   HCT 31.6 (L) 07/21/2017   PLT 224 07/21/2017   GLUCOSE 149 (H) 07/20/2017   CHOL 194 08/10/2016   TRIG 170.0 (  H) 08/10/2016   HDL 65.50 08/10/2016   LDLDIRECT 237.0 05/11/2016   LDLCALC 94 08/10/2016   ALT 24 07/13/2017   AST 28 07/13/2017   NA 134 (L) 07/20/2017   K 3.4 (L) 07/20/2017   CL 96 (L) 07/20/2017   CREATININE 0.83 07/20/2017   BUN 19 07/20/2017   CO2 27 07/20/2017   TSH 3.88 05/11/2016   INR 0.94 07/13/2017    Assessment/Plan:  Essential hypertension  -Controlled -Continue lifestyle  modifications -Continue meds current medications Norvasc 10 mg and hydrochlorothiazide 25 mg daily. - Plan: Basic metabolic panel, hydrochlorothiazide (HYDRODIURIL) 25 MG tablet  Status post total right knee replacement -Patient encouraged to keep follow-up with Ortho -Patient encouraged not to push herself too hard during her participation in softball at the senior games  Follow-up PRN.  Consider physical in the next few months.  Grier Mitts, MD

## 2017-09-28 NOTE — Patient Instructions (Addendum)
Susan Jenkins , Thank you for taking time to come for your Medicare Wellness Visit. I appreciate your ongoing commitment to your health goals. Please review the following plan we discussed and let me know if I can assist you in the future.   Susan Jenkins will call [facility] for your mammogram   Good Susan Jenkins in the games!  On the waiting list at [facility] for the shingrix  Shingrix is a vaccine for the prevention of Shingles in Adults 50 and older.  If you are on Medicare, the shingrix is covered under your Part D plan, so you will take both of the vaccines in the series at your pharmacy. Please check with your benefits regarding applicable copays or out of pocket expenses.  The Shingrix is given in 2 vaccines approx 8 weeks apart. You must receive the 2nd dose prior to 6 months from receipt of the first. Please have the pharmacist print out you Immunization  dates for our office records   . These are the goals we discussed: Goals    . Patient Stated     Will continue to play sports and remain socially active        This is a list of the screening recommended for you and due dates:  Health Maintenance  Topic Date Due  .  Hepatitis C: One time screening is recommended by Center for Disease Control  (CDC) for  adults born from [birth year] through 1963.   09/29/2018*  . Flu Shot  12/01/2017  . Mammogram  01/31/2018  . Colon Cancer Screening  11/23/2025  . Tetanus Vaccine  07/05/2027  . DEXA scan (bone density measurement)  Completed  . Pneumonia vaccines  Completed  *Topic was postponed. The date shown is not the original due date.   Health Maintenance, Female Adopting a healthy lifestyle and getting preventive care can go a long way to promote health and wellness. Talk with your health care provider about what schedule of regular examinations is right for you. This is a good chance for you to check in with your provider about disease prevention and staying healthy. In between checkups, there are plenty of  things you can do on your own. Experts have done a lot of research about which lifestyle changes and preventive measures are most likely to keep you healthy. Ask your health care provider for more information. Weight and diet Eat a healthy diet  Be sure to include plenty of vegetables, fruits, low-fat dairy products, and lean protein.  Do not eat a lot of foods high in solid fats, added sugars, or salt.  Get regular exercise. This is one of the most important things you can do for your health. ? Most adults should exercise for at least 150 minutes each week. The exercise should increase your heart rate and make you sweat (moderate-intensity exercise). ? Most adults should also do strengthening exercises at least twice a week. This is in addition to the moderate-intensity exercise.  Maintain a healthy weight  Body mass index (BMI) is a measurement that can be used to identify possible weight problems. It estimates body fat based on height and weight. Your health care provider can help determine your BMI and help you achieve or maintain a healthy weight.  For females 22 years of age and older: ? A BMI below 18.5 is considered underweight. ? A BMI of 18.5 to 24.9 is normal. ? A BMI of 25 to 29.9 is considered overweight. ? A BMI of 30 and above is  considered obese.  Watch levels of cholesterol and blood lipids  You should start having your blood tested for lipids and cholesterol at 70 years of age, then have this test every 5 years.  You may need to have your cholesterol levels checked more often if: ? Your lipid or cholesterol levels are high. ? You are older than 70 years of age. ? You are at high risk for heart disease.  Cancer screening Lung Cancer  Lung cancer screening is recommended for adults 100-23 years old who are at high risk for lung cancer because of a history of smoking.  A yearly low-dose CT scan of the lungs is recommended for people who: ? Currently smoke. ? Have  quit within the past 15 years. ? Have at least a 30-pack-year history of smoking. A pack year is smoking an average of one pack of cigarettes a day for 1 year.  Yearly screening should continue until it has been 15 years since you quit.  Yearly screening should stop if you develop a health problem that would prevent you from having lung cancer treatment.  Breast Cancer  Practice breast self-awareness. This means understanding how your breasts normally appear and feel.  It also means doing regular breast self-exams. Let your health care provider know about any changes, no matter how small.  If you are in your 20s or 30s, you should have a clinical breast exam (CBE) by a health care provider every 1-3 years as part of a regular health exam.  If you are 30 or older, have a CBE every year. Also consider having a breast X-ray (mammogram) every year.  If you have a family history of breast cancer, talk to your health care provider about genetic screening.  If you are at high risk for breast cancer, talk to your health care provider about having an MRI and a mammogram every year.  Breast cancer gene (BRCA) assessment is recommended for women who have family members with BRCA-related cancers. BRCA-related cancers include: ? Breast. ? Ovarian. ? Tubal. ? Peritoneal cancers.  Results of the assessment will determine the need for genetic counseling and BRCA1 and BRCA2 testing.  Cervical Cancer Your health care provider may recommend that you be screened regularly for cancer of the pelvic organs (ovaries, uterus, and vagina). This screening involves a pelvic examination, including checking for microscopic changes to the surface of your cervix (Pap test). You may be encouraged to have this screening done every 3 years, beginning at age 39.  For women ages 41-65, health care providers may recommend pelvic exams and Pap testing every 3 years, or they may recommend the Pap and pelvic exam, combined  with testing for human papilloma virus (HPV), every 5 years. Some types of HPV increase your risk of cervical cancer. Testing for HPV may also be done on women of any age with unclear Pap test results.  Other health care providers may not recommend any screening for nonpregnant women who are considered low risk for pelvic cancer and who do not have symptoms. Ask your health care provider if a screening pelvic exam is right for you.  If you have had past treatment for cervical cancer or a condition that could lead to cancer, you need Pap tests and screening for cancer for at least 20 years after your treatment. If Pap tests have been discontinued, your risk factors (such as having a new sexual partner) need to be reassessed to determine if screening should resume. Some women have medical  problems that increase the chance of getting cervical cancer. In these cases, your health care provider may recommend more frequent screening and Pap tests.  Colorectal Cancer  This type of cancer can be detected and often prevented.  Routine colorectal cancer screening usually begins at 70 years of age and continues through 70 years of age.  Your health care provider may recommend screening at an earlier age if you have risk factors for colon cancer.  Your health care provider may also recommend using home test kits to check for hidden blood in the stool.  A small camera at the end of a tube can be used to examine your colon directly (sigmoidoscopy or colonoscopy). This is done to check for the earliest forms of colorectal cancer.  Routine screening usually begins at age 78.  Direct examination of the colon should be repeated every 5-10 years through 70 years of age. However, you may need to be screened more often if early forms of precancerous polyps or small growths are found.  Skin Cancer  Check your skin from head to toe regularly.  Tell your health care provider about any new moles or changes in moles,  especially if there is a change in a mole's shape or color.  Also tell your health care provider if you have a mole that is larger than the size of a pencil eraser.  Always use sunscreen. Apply sunscreen liberally and repeatedly throughout the day.  Protect yourself by wearing long sleeves, pants, a wide-brimmed hat, and sunglasses whenever you are outside.  Heart disease, diabetes, and high blood pressure  High blood pressure causes heart disease and increases the risk of stroke. High blood pressure is more likely to develop in: ? People who have blood pressure in the high end of the normal range (130-139/85-89 mm Hg). ? People who are overweight or obese. ? People who are African American.  If you are 31-20 years of age, have your blood pressure checked every 3-5 years. If you are 9 years of age or older, have your blood pressure checked every year. You should have your blood pressure measured twice-once when you are at a hospital or clinic, and once when you are not at a hospital or clinic. Record the average of the two measurements. To check your blood pressure when you are not at a hospital or clinic, you can use: ? An automated blood pressure machine at a pharmacy. ? A home blood pressure monitor.  If you are between 13 years and 32 years old, ask your health care provider if you should take aspirin to prevent strokes.  Have regular diabetes screenings. This involves taking a blood sample to check your fasting blood sugar level. ? If you are at a normal weight and have a low risk for diabetes, have this test once every three years after 70 years of age. ? If you are overweight and have a high risk for diabetes, consider being tested at a younger age or more often. Preventing infection Hepatitis B  If you have a higher risk for hepatitis B, you should be screened for this virus. You are considered at high risk for hepatitis B if: ? You were born in a country where hepatitis B is  common. Ask your health care provider which countries are considered high risk. ? Your parents were born in a high-risk country, and you have not been immunized against hepatitis B (hepatitis B vaccine). ? You have HIV or AIDS. ? You use needles  to inject street drugs. ? You live with someone who has hepatitis B. ? You have had sex with someone who has hepatitis B. ? You get hemodialysis treatment. ? You take certain medicines for conditions, including cancer, organ transplantation, and autoimmune conditions.  Hepatitis C  Blood testing is recommended for: ? Everyone born from [birth year] through 1963. ? Anyone with known risk factors for hepatitis C.  Sexually transmitted infections (STIs)  You should be screened for sexually transmitted infections (STIs) including gonorrhea and chlamydia if: ? You are sexually active and are younger than 70 years of age. ? You are older than 70 years of age and your health care provider tells you that you are at risk for this type of infection. ? Your sexual activity has changed since you were last screened and you are at an increased risk for chlamydia or gonorrhea. Ask your health care provider if you are at risk.  If you do not have HIV, but are at risk, it may be recommended that you take a prescription medicine daily to prevent HIV infection. This is called pre-exposure prophylaxis (PrEP). You are considered at risk if: ? You are sexually active and do not regularly use condoms or know the HIV status of your partner(s). ? You take drugs by injection. ? You are sexually active with a partner who has HIV.  Talk with your health care provider about whether you are at high risk of being infected with HIV. If you choose to begin PrEP, you should first be tested for HIV. You should then be tested every 3 months for as long as you are taking PrEP. Pregnancy  If you are premenopausal and you may become pregnant, ask your health care provider about preconception  counseling.  If you may become pregnant, take 400 to 800 micrograms (mcg) of folic acid every day.  If you want to prevent pregnancy, talk to your health care provider about birth control (contraception). Osteoporosis and menopause  Osteoporosis is a disease in which the bones lose minerals and strength with aging. This can result in serious bone fractures. Your risk for osteoporosis can be identified using a bone density scan.  If you are 35 years of age or older, or if you are at risk for osteoporosis and fractures, ask your health care provider if you should be screened.  Ask your health care provider whether you should take a calcium or vitamin D supplement to lower your risk for osteoporosis.  Menopause may have certain physical symptoms and risks.  Hormone replacement therapy may reduce some of these symptoms and risks. Talk to your health care provider about whether hormone replacement therapy is right for you. Follow these instructions at home:  Schedule regular health, dental, and eye exams.  Stay current with your immunizations.  Do not use any tobacco products including cigarettes, chewing tobacco, or electronic cigarettes.  If you are pregnant, do not drink alcohol.  If you are breastfeeding, limit how much and how often you drink alcohol.  Limit alcohol intake to no more than 1 drink per day for nonpregnant women. One drink equals 12 ounces of beer, 5 ounces of wine, or 1 ounces of hard liquor.  Do not use street drugs.  Do not share needles.  Ask your health care provider for help if you need support or information about quitting drugs.  Tell your health care provider if you often feel depressed.  Tell your health care provider if you have ever been abused or  do not feel safe at home. This information is not intended to replace advice given to you by your health care provider. Make sure you discuss any questions you have with your health care provider. Document  Released: 11/02/2010 Document Revised: 09/25/2015 Document Reviewed: 01/21/2015 Elsevier Interactive Patient Education  2018 [place] in the Home Falls can cause injuries and can affect people from all age groups. There are many simple things that you can do to make your home safe and to help prevent falls. What can I do on the outside of my home?  Regularly repair the edges of walkways and driveways and fix any cracks.  Remove high doorway thresholds.  Trim any shrubbery on the main path into your home.  Use bright outdoor lighting.  Clear walkways of debris and clutter, including tools and rocks.  Regularly check that handrails are securely fastened and in good repair. Both sides of any steps should have handrails.  Install guardrails along the edges of any raised decks or porches.  Have leaves, snow, and ice cleared regularly.  Use sand or salt on walkways during winter months.  In the garage, clean up any spills right away, including grease or oil spills. What can I do in the bathroom?  Use night lights.  Install grab bars by the toilet and in the tub and shower. Do not use towel bars as grab bars.  Use non-skid mats or decals on the floor of the tub or shower.  If you need to sit down while you are in the shower, use a plastic, non-slip stool.  Keep the floor dry. Immediately clean up any water that spills on the floor.  Remove soap buildup in the tub or shower on a regular basis.  Attach bath mats securely with double-sided non-slip rug tape.  Remove throw rugs and other tripping hazards from the floor. What can I do in the bedroom?  Use night lights.  Make sure that a bedside light is easy to reach.  Do not use oversized bedding that drapes onto the floor.  Have a firm chair that has side arms to use for getting dressed.  Remove throw rugs and other tripping hazards from the floor. What can I do in the kitchen?  Clean up any  spills right away.  Avoid walking on wet floors.  Place frequently used items in easy-to-reach places.  If you need to reach for something above you, use a sturdy step stool that has a grab bar.  Keep electrical cables out of the way.  Do not use floor polish or wax that makes floors slippery. If you have to use wax, make sure that it is non-skid floor wax.  Remove throw rugs and other tripping hazards from the floor. What can I do in the stairways?  Do not leave any items on the stairs.  Make sure that there are handrails on both sides of the stairs. Fix handrails that are broken or loose. Make sure that handrails are as long as the stairways.  Check any carpeting to make sure that it is firmly attached to the stairs. Fix any carpet that is loose or worn.  Avoid having throw rugs at the top or bottom of stairways, or secure the rugs with carpet tape to prevent them from moving.  Make sure that you have a light switch at the top of the stairs and the bottom of the stairs. If you do not have them, have them installed. What  are some other fall prevention tips?  Wear closed-toe shoes that fit well and support your feet. Wear shoes that have rubber soles or low heels.  When you use a stepladder, make sure that it is completely opened and that the sides are firmly locked. Have someone hold the ladder while you are using it. Do not climb a closed stepladder.  Add color or contrast paint or tape to grab bars and handrails in your home. Place contrasting color strips on the first and last steps.  Use mobility aids as needed, such as canes, walkers, scooters, and crutches.  Turn on lights if it is dark. Replace any light bulbs that burn out.  Set up furniture so that there are clear paths. Keep the furniture in the same spot.  Fix any uneven floor surfaces.  Choose a carpet design that does not hide the edge of steps of a stairway.  Be aware of any and all pets.  Review your  medicines with your healthcare provider. Some medicines can cause dizziness or changes in blood pressure, which increase your risk of falling. Talk with your health care provider about other ways that you can decrease your risk of falls. This may include working with a physical therapist or trainer to improve your strength, balance, and endurance. This information is not intended to replace advice given to you by your health care provider. Make sure you discuss any questions you have with your health care provider. Document Released: 04/09/2002 Document Revised: 09/16/2015 Document Reviewed: 05/24/2014 Elsevier Interactive Patient Education  2018 [employer].    Managing Your Hypertension Hypertension is commonly called high blood pressure. This is when the force of your blood pressing against the walls of your arteries is too strong. Arteries are blood vessels that carry blood from your heart throughout your body. Hypertension forces the heart to work harder to pump blood, and may cause the arteries to become narrow or stiff. Having untreated or uncontrolled hypertension can cause heart attack, stroke, kidney disease, and other problems. What are blood pressure readings? A blood pressure reading consists of a higher number over a lower number. Ideally, your blood pressure should be below 120/80. The first ("top") number is called the systolic pressure. It is a measure of the pressure in your arteries as your heart beats. The second ("bottom") number is called the diastolic pressure. It is a measure of the pressure in your arteries as the heart relaxes. What does my blood pressure reading mean? Blood pressure is classified into four stages. Based on your blood pressure reading, your health care provider may use the following stages to determine what type of treatment you need, if any. Systolic pressure and diastolic pressure are measured in a unit called mm Hg. Normal  Systolic pressure: below  120.  Diastolic pressure: below 80. Elevated  Systolic pressure: 856-314.  Diastolic pressure: below 80. Hypertension stage 1  Systolic pressure: 970-263.  Diastolic pressure: 78-58. Hypertension stage 2  Systolic pressure: 850 or above.  Diastolic pressure: 90 or above. What health risks are associated with hypertension? Managing your hypertension is an important responsibility. Uncontrolled hypertension can lead to:  A heart attack.  A stroke.  A weakened blood vessel (aneurysm).  Heart failure.  Kidney damage.  Eye damage.  Metabolic syndrome.  Memory and concentration problems.  What changes can I make to manage my hypertension? Hypertension can be managed by making lifestyle changes and possibly by taking medicines. Your health care provider will help you make a  plan to bring your blood pressure within a normal range. Eating and drinking  Eat a diet that is high in fiber and potassium, and low in salt (sodium), added sugar, and fat. An example eating plan is called the DASH (Dietary Approaches to Stop Hypertension) diet. To eat this way: ? Eat plenty of fresh fruits and vegetables. Try to fill half of your plate at each meal with fruits and vegetables. ? Eat whole grains, such as whole wheat pasta, brown rice, or whole grain bread. Fill about one quarter of your plate with whole grains. ? Eat low-fat diary products. ? Avoid fatty cuts of meat, processed or cured meats, and poultry with skin. Fill about one quarter of your plate with lean proteins such as fish, chicken without skin, beans, eggs, and tofu. ? Avoid premade and processed foods. These tend to be higher in sodium, added sugar, and fat.  Reduce your daily sodium intake. Most people with hypertension should eat less than 1,500 mg of sodium a day.  Limit alcohol intake to no more than 1 drink a day for nonpregnant women and 2 drinks a day for men. One drink equals 12 oz of beer, 5 oz of wine, or 1 oz of  hard liquor. Lifestyle  Work with your health care provider to maintain a healthy body weight, or to lose weight. Ask what an ideal weight is for you.  Get at least 30 minutes of exercise that causes your heart to beat faster (aerobic exercise) most days of the week. Activities may include walking, swimming, or biking.  Include exercise to strengthen your muscles (resistance exercise), such as weight lifting, as part of your weekly exercise routine. Try to do these types of exercises for 30 minutes at least 3 days a week.  Do not use any products that contain nicotine or tobacco, such as cigarettes and e-cigarettes. If you need help quitting, ask your health care provider.  Control any long-term (chronic) conditions you have, such as high cholesterol or diabetes. Monitoring  Monitor your blood pressure at home as told by your health care provider. Your personal target blood pressure may vary depending on your medical conditions, your age, and other factors.  Have your blood pressure checked regularly, as often as told by your health care provider. Working with your health care provider  Review all the medicines you take with your health care provider because there may be side effects or interactions.  Talk with your health care provider about your diet, exercise habits, and other lifestyle factors that may be contributing to hypertension.  Visit your health care provider regularly. Your health care provider can help you create and adjust your plan for managing hypertension. Will I need medicine to control my blood pressure? Your health care provider may prescribe medicine if lifestyle changes are not enough to get your blood pressure under control, and if:  Your systolic blood pressure is 130 or higher.  Your diastolic blood pressure is 80 or higher.  Take medicines only as told by your health care provider. Follow the directions carefully. Blood pressure medicines must be taken as  prescribed. The medicine does not work as well when you skip doses. Skipping doses also puts you at risk for problems. Contact a health care provider if:  You think you are having a reaction to medicines you have taken.  You have repeated (recurrent) headaches.  You feel dizzy.  You have swelling in your ankles.  You have trouble with your vision. Get  help right away if:  You develop a severe headache or confusion.  You have unusual weakness or numbness, or you feel faint.  You have severe pain in your chest or abdomen.  You vomit repeatedly.  You have trouble breathing. Summary  Hypertension is when the force of blood pumping through your arteries is too strong. If this condition is not controlled, it may put you at risk for serious complications.  Your personal target blood pressure may vary depending on your medical conditions, your age, and other factors. For most people, a normal blood pressure is less than 120/80.  Hypertension is managed by lifestyle changes, medicines, or both. Lifestyle changes include weight loss, eating a healthy, low-sodium diet, exercising more, and limiting alcohol. This information is not intended to replace advice given to you by your health care provider. Make sure you discuss any questions you have with your health care provider. Document Released: 01/12/2012 Document Revised: 03/17/2016 Document Reviewed: 03/17/2016 Elsevier Interactive Patient Education  [employer].

## 2017-10-07 DIAGNOSIS — D220 Melanocytic nevi of lip: Secondary | ICD-10-CM | POA: Diagnosis not present

## 2017-10-07 DIAGNOSIS — D225 Melanocytic nevi of trunk: Secondary | ICD-10-CM | POA: Diagnosis not present

## 2017-10-07 DIAGNOSIS — L821 Other seborrheic keratosis: Secondary | ICD-10-CM | POA: Diagnosis not present

## 2017-10-07 DIAGNOSIS — D485 Neoplasm of uncertain behavior of skin: Secondary | ICD-10-CM | POA: Diagnosis not present

## 2017-10-07 DIAGNOSIS — L814 Other melanin hyperpigmentation: Secondary | ICD-10-CM | POA: Diagnosis not present

## 2017-10-07 DIAGNOSIS — L738 Other specified follicular disorders: Secondary | ICD-10-CM | POA: Diagnosis not present

## 2017-10-07 DIAGNOSIS — D1801 Hemangioma of skin and subcutaneous tissue: Secondary | ICD-10-CM | POA: Diagnosis not present

## 2017-10-28 ENCOUNTER — Other Ambulatory Visit: Payer: Self-pay | Admitting: Internal Medicine

## 2017-10-28 NOTE — Telephone Encounter (Signed)
Okay for refill? Please advise 

## 2017-11-10 DIAGNOSIS — M9901 Segmental and somatic dysfunction of cervical region: Secondary | ICD-10-CM | POA: Diagnosis not present

## 2017-11-10 DIAGNOSIS — M9903 Segmental and somatic dysfunction of lumbar region: Secondary | ICD-10-CM | POA: Diagnosis not present

## 2017-11-10 DIAGNOSIS — M542 Cervicalgia: Secondary | ICD-10-CM | POA: Diagnosis not present

## 2017-11-10 DIAGNOSIS — M545 Low back pain: Secondary | ICD-10-CM | POA: Diagnosis not present

## 2017-11-22 DIAGNOSIS — Z96651 Presence of right artificial knee joint: Secondary | ICD-10-CM | POA: Diagnosis not present

## 2017-11-22 DIAGNOSIS — Z471 Aftercare following joint replacement surgery: Secondary | ICD-10-CM | POA: Diagnosis not present

## 2017-11-22 DIAGNOSIS — M1711 Unilateral primary osteoarthritis, right knee: Secondary | ICD-10-CM | POA: Diagnosis not present

## 2017-12-08 DIAGNOSIS — M545 Low back pain: Secondary | ICD-10-CM | POA: Diagnosis not present

## 2017-12-08 DIAGNOSIS — M542 Cervicalgia: Secondary | ICD-10-CM | POA: Diagnosis not present

## 2017-12-08 DIAGNOSIS — M9901 Segmental and somatic dysfunction of cervical region: Secondary | ICD-10-CM | POA: Diagnosis not present

## 2017-12-08 DIAGNOSIS — M9903 Segmental and somatic dysfunction of lumbar region: Secondary | ICD-10-CM | POA: Diagnosis not present

## 2017-12-23 DIAGNOSIS — M1711 Unilateral primary osteoarthritis, right knee: Secondary | ICD-10-CM | POA: Diagnosis not present

## 2018-01-12 DIAGNOSIS — M9901 Segmental and somatic dysfunction of cervical region: Secondary | ICD-10-CM | POA: Diagnosis not present

## 2018-01-12 DIAGNOSIS — M542 Cervicalgia: Secondary | ICD-10-CM | POA: Diagnosis not present

## 2018-01-12 DIAGNOSIS — M545 Low back pain: Secondary | ICD-10-CM | POA: Diagnosis not present

## 2018-01-12 DIAGNOSIS — M9903 Segmental and somatic dysfunction of lumbar region: Secondary | ICD-10-CM | POA: Diagnosis not present

## 2018-02-06 ENCOUNTER — Other Ambulatory Visit: Payer: Self-pay | Admitting: Internal Medicine

## 2018-02-07 NOTE — Telephone Encounter (Signed)
Left message to return phone call.

## 2018-02-14 DIAGNOSIS — L814 Other melanin hyperpigmentation: Secondary | ICD-10-CM | POA: Diagnosis not present

## 2018-02-14 DIAGNOSIS — D485 Neoplasm of uncertain behavior of skin: Secondary | ICD-10-CM | POA: Diagnosis not present

## 2018-02-16 DIAGNOSIS — M9901 Segmental and somatic dysfunction of cervical region: Secondary | ICD-10-CM | POA: Diagnosis not present

## 2018-02-16 DIAGNOSIS — M9903 Segmental and somatic dysfunction of lumbar region: Secondary | ICD-10-CM | POA: Diagnosis not present

## 2018-02-16 DIAGNOSIS — M545 Low back pain: Secondary | ICD-10-CM | POA: Diagnosis not present

## 2018-02-16 DIAGNOSIS — M542 Cervicalgia: Secondary | ICD-10-CM | POA: Diagnosis not present

## 2018-03-08 ENCOUNTER — Encounter: Payer: Self-pay | Admitting: Family Medicine

## 2018-03-08 ENCOUNTER — Ambulatory Visit (INDEPENDENT_AMBULATORY_CARE_PROVIDER_SITE_OTHER): Payer: PPO | Admitting: Family Medicine

## 2018-03-08 VITALS — BP 118/60 | HR 52 | Temp 97.9°F | Ht 66.0 in | Wt 231.0 lb

## 2018-03-08 DIAGNOSIS — E782 Mixed hyperlipidemia: Secondary | ICD-10-CM

## 2018-03-08 DIAGNOSIS — Z131 Encounter for screening for diabetes mellitus: Secondary | ICD-10-CM | POA: Diagnosis not present

## 2018-03-08 DIAGNOSIS — R1902 Left upper quadrant abdominal swelling, mass and lump: Secondary | ICD-10-CM | POA: Diagnosis not present

## 2018-03-08 DIAGNOSIS — I10 Essential (primary) hypertension: Secondary | ICD-10-CM | POA: Diagnosis not present

## 2018-03-08 DIAGNOSIS — Z96651 Presence of right artificial knee joint: Secondary | ICD-10-CM

## 2018-03-08 LAB — CBC WITH DIFFERENTIAL/PLATELET
BASOS ABS: 0.1 10*3/uL (ref 0.0–0.1)
Basophils Relative: 1.1 % (ref 0.0–3.0)
EOS ABS: 0.1 10*3/uL (ref 0.0–0.7)
Eosinophils Relative: 2.7 % (ref 0.0–5.0)
HCT: 40.1 % (ref 36.0–46.0)
Hemoglobin: 14.1 g/dL (ref 12.0–15.0)
LYMPHS ABS: 1.4 10*3/uL (ref 0.7–4.0)
Lymphocytes Relative: 25.7 % (ref 12.0–46.0)
MCHC: 35.1 g/dL (ref 30.0–36.0)
MCV: 98.9 fl (ref 78.0–100.0)
Monocytes Absolute: 0.6 10*3/uL (ref 0.1–1.0)
Monocytes Relative: 10.7 % (ref 3.0–12.0)
NEUTROS ABS: 3.3 10*3/uL (ref 1.4–7.7)
NEUTROS PCT: 59.8 % (ref 43.0–77.0)
PLATELETS: 257 10*3/uL (ref 150.0–400.0)
RBC: 4.05 Mil/uL (ref 3.87–5.11)
RDW: 12.5 % (ref 11.5–15.5)
WBC: 5.5 10*3/uL (ref 4.0–10.5)

## 2018-03-08 LAB — LIPID PANEL
Cholesterol: 215 mg/dL — ABNORMAL HIGH (ref 0–200)
HDL: 62.8 mg/dL (ref >39.00)
NonHDL: 151.82
Total CHOL/HDL Ratio: 3
Triglycerides: 208 mg/dL — ABNORMAL HIGH (ref 0.0–149.0)
VLDL: 41.6 mg/dL — ABNORMAL HIGH (ref 0.0–40.0)

## 2018-03-08 LAB — LDL CHOLESTEROL, DIRECT: Direct LDL: 133 mg/dL

## 2018-03-08 LAB — HEMOGLOBIN A1C: Hgb A1c MFr Bld: 6 % (ref 4.6–6.5)

## 2018-03-08 NOTE — Patient Instructions (Signed)

## 2018-03-08 NOTE — Progress Notes (Signed)
Subjective:     Susan Jenkins is a 72 y.o. female and is here for f/u on chronic conditions. The patient reports no problems.  Pt would like hgb A1C checked as 2 of her sisters have recently been diagnosed as prediabetic.  Pt is s/p R TKR.  Pt states she is doing rehab and has been fine.  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  HLD: -Patient here for recheck of cholesterol -Taking fish oil capsules daily and Crestor 10 mg -Staying active  HTN: -Taking hydrochlorothiazide 25 mg and Norvasc 10 mg daily  Pt is having a mammogram in January 2020.  She is up to date on colonoscopy done 11/24/2015.  She has colonoscopy years given family history of father with colon cancer.  Pt had first shingles vaccine and will have her second vaccine 6 months after the first.  [facility].  Social History   Socioeconomic History  . Marital status: Single    Spouse name: Not on file  . Number of children: Not on file  . Years of education: Not on file  . Highest education level: Not on file  Occupational History  . Not on file  Social Needs  . Financial resource strain: Not on file  . Food insecurity:    Worry: Not on file    Inability: Not on file  . Transportation needs:    Medical: Not on file    Non-medical: Not on file  Tobacco Use  . Smoking status: Former Smoker    Types: Cigarettes    Last attempt to quit: 05/03/1980    Years since quitting: 37.8  . Smokeless tobacco: Never Used  . Tobacco comment: a .50 a pack;   Substance and Sexual Activity  . Alcohol use: No  . Drug use: No  . Sexual activity: Never  Lifestyle  . Physical activity:    Days per week: Not on file    Minutes per session: Not on file  . Stress: Not on file  Relationships  . Social connections:    Talks on phone: Not on file    Gets together: Not on file    Attends religious service: Not on file    Active member of club or  organization: Not on file    Attends meetings of clubs or organizations: Not on file    Relationship status: Not on file  . Intimate partner violence:    Fear of current or ex partner: Not on file    Emotionally abused: Not on file    Physically abused: Not on file    Forced sexual activity: Not on file  Other Topics Concern  . Not on file  Social History Narrative  . Not on file   Health Maintenance  Topic Date Due  . MAMMOGRAM  01/31/2018  . Hepatitis C Screening  09/29/2018 (Originally 31-Jul-1945)  . COLONOSCOPY  11/23/2025  . TETANUS/TDAP  07/05/2027  . INFLUENZA VACCINE  Completed  . DEXA SCAN  Completed  . PNA vac Low Risk Adult  Completed    The following portions of the patient's history were reviewed and updated as appropriate: allergies, current medications, past family history, past medical history, past social history, past surgical history and problem list.  Review of Systems Pertinent items noted in HPI and remainder of comprehensive ROS otherwise negative.   Objective:    BP 118/60 (BP  Location: Left Arm, Patient Position: Sitting, Cuff Size: Large)   Pulse (!) 52   Temp 97.9 F (36.6 C) (Oral)   Ht 5\' 6"  (1.676 m)   Wt 231 lb (104.8 kg)   SpO2 96%   BMI 37.28 kg/m  General appearance: alert, cooperative and no distress Head: Normocephalic, without obvious abnormality, atraumatic Eyes: conjunctivae/corneas clear. PERRL, EOM's intact. Fundi benign. Ears: normal TM's and external ear canals both ears Nose: Nares normal. Septum midline. Mucosa normal. No drainage or sinus tenderness. Throat: lips, mucosa, and tongue normal; teeth and gums normal Neck: no adenopathy, no carotid bruit, no JVD, supple, symmetrical, trachea midline and thyroid not enlarged, symmetric, no tenderness/mass/nodules Lungs: clear to auscultation bilaterally Heart: regular rate and rhythm, S1, S2 normal, no murmur, click, rub or gallop Abdomen: soft, non-tender; bowel sounds normal;  no masses,  no organomegaly and round mobile 1 cm mass noted in LUQ just underneath skin, NT, no erythema or enduration noted Extremities: extremities normal, atraumatic, no cyanosis or edema Skin: Skin color, texture, turgor normal. No rashes or lesions Neurologic: Alert and oriented X 3, normal strength and tone. Normal symmetric reflexes. Normal coordination and gait    Assessment:    Healthy female exam s/p R TKR.     Plan:      HTN -Controlled -Continue Norvasc 10 mg and hydrochlorothiazide 25 mg daily -Patient to continue lifestyle modifications  HLD -Continue Crestor 10 mg -Continue lifestyle modification  Screen for diabetes -We will obtain hemoglobin A1c given recent family history  Abdominal mass -Likely lymph node -Asymptomatic -We will continue to monitor  Right TKR -Continue with rehab -Continue follow-up with Ortho  See After Visit Summary for Counseling Recommendations   Influenza vaccine up-to-date.  Pt to have second shingles vaccine in a few months. Last Tdap 07/04/2017.  Follow-up PRN  Grier Mitts, MD

## 2018-03-16 DIAGNOSIS — M545 Low back pain: Secondary | ICD-10-CM | POA: Diagnosis not present

## 2018-03-16 DIAGNOSIS — M542 Cervicalgia: Secondary | ICD-10-CM | POA: Diagnosis not present

## 2018-03-16 DIAGNOSIS — M9901 Segmental and somatic dysfunction of cervical region: Secondary | ICD-10-CM | POA: Diagnosis not present

## 2018-03-16 DIAGNOSIS — M9903 Segmental and somatic dysfunction of lumbar region: Secondary | ICD-10-CM | POA: Diagnosis not present

## 2018-03-20 ENCOUNTER — Other Ambulatory Visit: Payer: Self-pay | Admitting: Internal Medicine

## 2018-04-19 ENCOUNTER — Other Ambulatory Visit: Payer: Self-pay | Admitting: Internal Medicine

## 2018-04-20 DIAGNOSIS — M545 Low back pain: Secondary | ICD-10-CM | POA: Diagnosis not present

## 2018-04-20 DIAGNOSIS — M9901 Segmental and somatic dysfunction of cervical region: Secondary | ICD-10-CM | POA: Diagnosis not present

## 2018-04-20 DIAGNOSIS — M542 Cervicalgia: Secondary | ICD-10-CM | POA: Diagnosis not present

## 2018-04-20 DIAGNOSIS — M9903 Segmental and somatic dysfunction of lumbar region: Secondary | ICD-10-CM | POA: Diagnosis not present

## 2018-05-08 ENCOUNTER — Telehealth: Payer: Self-pay | Admitting: Family Medicine

## 2018-05-08 ENCOUNTER — Other Ambulatory Visit: Payer: Self-pay

## 2018-05-08 MED ORDER — ROSUVASTATIN CALCIUM 10 MG PO TABS: ORAL_TABLET | ORAL | 0 refills | Status: DC

## 2018-05-08 MED ORDER — AMLODIPINE BESYLATE 10 MG PO TABS: 10.0000 mg | ORAL_TABLET | Freq: Every day | ORAL | 1 refills | Status: DC

## 2018-05-08 NOTE — Telephone Encounter (Signed)
Copied from [place] [id number]. Topic: Quick Communication - See Telephone Encounter >> May 08, 2018 12:11 PM Valla Leaver wrote: CRM for notification. See Telephone encounter for: 05/08/18.    Pharmacy requested amLODipine (NORVASC) 10 MG tablet on 04/19/2018 but sent to Dr. Raliegh Ip and patient now see's Dr. Volanda Napoleon. Refill is pending in the original encounter and patient only has 6 pills left. Also rosuvastatin (CRESTOR) 10 MG tablet needs to be renewed to reflect the patient is supposed to take it everyday 1x per day per Dr. Volanda Napoleon instructions.

## 2018-05-08 NOTE — Telephone Encounter (Signed)
Rx was sent to pt pharmacy as requested °

## 2018-05-08 NOTE — Telephone Encounter (Signed)
Requested Prescriptions  Pending Prescriptions Disp Refills  . amLODipine (NORVASC) 10 MG tablet [Pharmacy Med Name: AMLODIPINE BESYLATE 10 MG TAB] 90 tablet 1    Sig: TAKE 1 TABLET BY MOUTH EVERY DAY     Cardiovascular:  Calcium Channel Blockers Passed - 05/08/2018 12:11 PM      Passed - Last BP in normal range    BP Readings from Last 1 Encounters:  03/08/18 118/60         Passed - Valid encounter within last 6 months    Recent Outpatient Visits          2 months ago Essential hypertension   [facility] [place], [place]   7 months ago Essential hypertension   [facility], Langley Adie, MD   1 year ago Pure hypercholesterolemia   [place] [facility], Doretha Sou, MD   1 year ago Essential hypertension   [facility], Doretha Sou, MD   2 years ago Injury of left foot, initial encounter   [facility], Laurey Arrow, MD

## 2018-05-18 DIAGNOSIS — M545 Low back pain: Secondary | ICD-10-CM | POA: Diagnosis not present

## 2018-05-18 DIAGNOSIS — M9901 Segmental and somatic dysfunction of cervical region: Secondary | ICD-10-CM | POA: Diagnosis not present

## 2018-05-18 DIAGNOSIS — M9903 Segmental and somatic dysfunction of lumbar region: Secondary | ICD-10-CM | POA: Diagnosis not present

## 2018-05-18 DIAGNOSIS — M542 Cervicalgia: Secondary | ICD-10-CM | POA: Diagnosis not present

## 2018-05-31 ENCOUNTER — Other Ambulatory Visit: Payer: Self-pay

## 2018-05-31 MED ORDER — ROSUVASTATIN CALCIUM 10 MG PO TABS: ORAL_TABLET | ORAL | 0 refills | Status: DC

## 2018-05-31 NOTE — Telephone Encounter (Signed)
Spoke with pt aware that new Rx for Crestor was sent to pharmacy

## 2018-05-31 NOTE — Telephone Encounter (Signed)
Patient is calling and states that she is running out of rosuvastatin (CRESTOR) 10 MG tablet and she states the pharmacy is telling her its because she is taking it every day as she was told to by Dr. Volanda Napoleon, but it was still instructed for 3 times a day. [place] and is needing this mediation.   [id number] [address], [place]  [place] [address]  Phone: [phone] Fax: [phone]

## 2018-06-16 ENCOUNTER — Other Ambulatory Visit: Payer: Self-pay | Admitting: Family Medicine

## 2018-06-16 DIAGNOSIS — I10 Essential (primary) hypertension: Secondary | ICD-10-CM

## 2018-06-22 DIAGNOSIS — M545 Low back pain: Secondary | ICD-10-CM | POA: Diagnosis not present

## 2018-06-22 DIAGNOSIS — M9901 Segmental and somatic dysfunction of cervical region: Secondary | ICD-10-CM | POA: Diagnosis not present

## 2018-06-22 DIAGNOSIS — M9903 Segmental and somatic dysfunction of lumbar region: Secondary | ICD-10-CM | POA: Diagnosis not present

## 2018-06-22 DIAGNOSIS — M542 Cervicalgia: Secondary | ICD-10-CM | POA: Diagnosis not present

## 2018-06-26 ENCOUNTER — Ambulatory Visit (INDEPENDENT_AMBULATORY_CARE_PROVIDER_SITE_OTHER): Payer: PPO | Admitting: Family Medicine

## 2018-06-26 ENCOUNTER — Ambulatory Visit: Payer: Self-pay | Admitting: Family Medicine

## 2018-06-26 ENCOUNTER — Encounter: Payer: Self-pay | Admitting: Family Medicine

## 2018-06-26 VITALS — Temp 98.4°F | Wt 230.0 lb

## 2018-06-26 DIAGNOSIS — H66001 Acute suppurative otitis media without spontaneous rupture of ear drum, right ear: Secondary | ICD-10-CM

## 2018-06-26 DIAGNOSIS — R42 Dizziness and giddiness: Secondary | ICD-10-CM | POA: Diagnosis not present

## 2018-06-26 MED ORDER — ONDANSETRON HCL 4 MG PO TABS: 4.0000 mg | ORAL_TABLET | Freq: Three times a day (TID) | ORAL | 0 refills | Status: DC | PRN

## 2018-06-26 MED ORDER — ONDANSETRON HCL 4 MG/2ML IJ SOLN
4.0000 mg | Freq: Once | INTRAMUSCULAR | Status: AC
  Administered 2018-06-26: 4 mg via INTRAMUSCULAR

## 2018-06-26 MED ORDER — AZITHROMYCIN 250 MG PO TABS: ORAL_TABLET | ORAL | 0 refills | Status: DC

## 2018-06-26 MED ORDER — MECLIZINE HCL 25 MG PO TABS: 25.0000 mg | ORAL_TABLET | Freq: Three times a day (TID) | ORAL | 0 refills | Status: AC | PRN

## 2018-06-26 NOTE — Patient Instructions (Signed)
Dizziness Dizziness is a common problem. It is a feeling of unsteadiness or light-headedness. You may feel like you are about to faint. Dizziness can lead to injury if you stumble or fall. Anyone can become dizzy, but dizziness is more common in older adults. This condition can be caused by a number of things, including medicines, dehydration, or illness. Follow these instructions at home: Eating and drinking  Drink enough fluid to keep your urine clear or pale yellow. This helps to keep you from becoming dehydrated. Try to drink more clear fluids, such as water.  Do not drink alcohol.  Limit your caffeine intake if told to do so by your health care provider. Check ingredients and nutrition facts to see if a food or beverage contains caffeine.  Limit your salt (sodium) intake if told to do so by your health care provider. Check ingredients and nutrition facts to see if a food or beverage contains sodium. Activity  Avoid making quick movements. ? Rise slowly from chairs and steady yourself until you feel okay. ? In the morning, first sit up on the side of the bed. When you feel okay, stand slowly while you hold onto something until you know that your balance is fine.  If you need to stand in one place for a long time, move your legs often. Tighten and relax the muscles in your legs while you are standing.  Do not drive or use heavy machinery if you feel dizzy.  Avoid bending down if you feel dizzy. Place items in your home so that they are easy for you to reach without leaning over. Lifestyle  Do not use any products that contain nicotine or tobacco, such as cigarettes and e-cigarettes. If you need help quitting, ask your health care provider.  Try to reduce your stress level by using methods such as yoga or meditation. Talk with your health care provider if you need help to manage your stress. General instructions  Watch your dizziness for any changes.  Take over-the-counter and  prescription medicines only as told by your health care provider. Talk with your health care provider if you think that your dizziness is caused by a medicine that you are taking.  Tell a friend or a family member that you are feeling dizzy. If he or she notices any changes in your behavior, have this person call your health care provider.  Keep all follow-up visits as told by your health care provider. This is important. Contact a health care provider if:  Your dizziness does not go away.  Your dizziness or light-headedness gets worse.  You feel nauseous.  You have reduced hearing.  You have new symptoms.  You are unsteady on your feet or you feel like the room is spinning. Get help right away if:  You vomit or have diarrhea and are unable to eat or drink anything.  You have problems talking, walking, swallowing, or using your arms, hands, or legs.  You feel generally weak.  You are not thinking clearly or you have trouble forming sentences. It may take a friend or family member to notice this.  You have chest pain, abdominal pain, shortness of breath, or sweating.  Your vision changes.  You have any bleeding.  You have a severe headache.  You have neck pain or a stiff neck.  You have a fever. These symptoms may represent a serious problem that is an emergency. Do not wait to see if the symptoms will go away. Get medical help   right away. Call your local emergency services (911 in the [place] Do not drive yourself to the hospital. Summary  Dizziness is a feeling of unsteadiness or light-headedness. This condition can be caused by a number of things, including medicines, dehydration, or illness.  Anyone can become dizzy, but dizziness is more common in older adults.  Drink enough fluid to keep your urine clear or pale yellow. Do not drink alcohol.  Avoid making quick movements if you feel dizzy. Monitor your dizziness for any changes. This information is not intended to  replace advice given to you by your health care provider. Make sure you discuss any questions you have with your health care provider. Document Released: 10/13/2000 Document Revised: 05/22/2016 Document Reviewed: 05/22/2016 Elsevier Interactive Patient Education  2019 [employer]. How to Perform the Epley Maneuver The Epley maneuver is an exercise that relieves symptoms of vertigo. Vertigo is the feeling that you or your surroundings are moving when they are not. When you feel vertigo, you may feel like the room is spinning and have trouble walking. Dizziness is a little different than vertigo. When you are dizzy, you may feel unsteady or light-headed. You can do this maneuver at home whenever you have symptoms of vertigo. You can do it up to 3 times a day until your symptoms go away. Even though the Epley maneuver may relieve your vertigo for a few weeks, it is possible that your symptoms will return. This maneuver relieves vertigo, but it does not relieve dizziness. What are the risks? If it is done correctly, the Epley maneuver is considered safe. Sometimes it can lead to dizziness or nausea that goes away after a short time. If you develop other symptoms, such as changes in vision, weakness, or numbness, stop doing the maneuver and call your health care provider. How to perform the Epley maneuver 1. Sit on the edge of a bed or table with your back straight and your legs extended or hanging over the edge of the bed or table. 2. Turn your head halfway toward the affected ear or side. 3. Lie backward quickly with your head turned until you are lying flat on your back. You may want to position a pillow under your shoulders. 4. Hold this position for 30 seconds. You may experience an attack of vertigo. This is normal. 5. Turn your head to the opposite direction until your unaffected ear is facing the floor. 6. Hold this position for 30 seconds. You may experience an attack of vertigo. This is normal.  Hold this position until the vertigo stops. 7. Turn your whole body to the same side as your head. Hold for another 30 seconds. 8. Sit back up. You can repeat this exercise up to 3 times a day. Follow these instructions at home:  After doing the Epley maneuver, you can return to your normal activities.  Ask your health care provider if there is anything you should do at home to prevent vertigo. He or she may recommend that you: ? Keep your head raised (elevated) with two or more pillows while you sleep. ? Do not sleep on the side of your affected ear. ? Get up slowly from bed. ? Avoid sudden movements during the day. ? Avoid extreme head movement, like looking up or bending over. Contact a health care provider if:  Your vertigo gets worse.  You have other symptoms, including: ? Nausea. ? Vomiting. ? Headache. Get help right away if:  You have vision changes.  You have  a severe or worsening headache or neck pain.  You cannot stop vomiting.  You have new numbness or weakness in any part of your body. Summary  Vertigo is the feeling that you or your surroundings are moving when they are not.  The Epley maneuver is an exercise that relieves symptoms of vertigo.  If the Epley maneuver is done correctly, it is considered safe. You can do it up to 3 times a day. This information is not intended to replace advice given to you by your health care provider. Make sure you discuss any questions you have with your health care provider. Document Released: 04/24/2013 Document Revised: 03/09/2016 Document Reviewed: 03/09/2016 Elsevier Interactive Patient Education  2019 [employer].  Otitis Media, Adult  Otitis media means that the middle ear is red and swollen (inflamed) and full of fluid. The condition usually goes away on its own. Follow these instructions at home:  Take over-the-counter and prescription medicines only as told by your doctor.  If you were prescribed an  antibiotic medicine, take it as told by your doctor. Do not stop taking the antibiotic even if you start to feel better.  Keep all follow-up visits as told by your doctor. This is important. Contact a doctor if:  You have bleeding from your nose.  There is a lump on your neck.  You are not getting better in 5 days.  You feel worse instead of better. Get help right away if:  You have pain that is not helped with medicine.  You have swelling, redness, or pain around your ear.  You get a stiff neck.  You cannot move part of your face (paralyzed).  You notice that the bone behind your ear hurts when you touch it.  You get a very bad headache. Summary  Otitis media means that the middle ear is red, swollen, and full of fluid.  This condition usually goes away on its own. In some cases, treatment may be needed.  If you were prescribed an antibiotic medicine, take it as told by your doctor. This information is not intended to replace advice given to you by your health care provider. Make sure you discuss any questions you have with your health care provider. Document Released: 10/06/2007 Document Revised: 05/10/2016 Document Reviewed: 05/10/2016 Elsevier Interactive Patient Education  2019 [employer].

## 2018-06-26 NOTE — Progress Notes (Addendum)
Subjective:    Patient ID: Susan Jenkins, female    DOB: 05-May-1945, 73 y.o.   MRN: [id number]  No chief complaint on file.   HPI Patient was seen today for acute concern.  Pt states she woke up with dizziness, HA, and nausea.  Symptoms worse with looking to the right.  Pt states she had vertigo several yrs ago.  Pt has not taking anything for her symptoms.  Pt denies fever, changes in meds, ear pain.  May have noticed some increased pressure in R ear.  Denies sick contacts.  Past Medical History:  Diagnosis Date  . Complication of anesthesia   . Depression   . Glaucoma   . Hemorrhoids   . HOCM (hypertrophic obstructive cardiomyopathy) ([facility])   . Hyperlipidemia   . Hypertension   . PONV (postoperative nausea and vomiting)   . Pulmonary embolus ([place]) 1971   following surgery  . Vitamin D deficiency     Allergies  Allergen Reactions  . Amoxicillin-Pot Clavulanate Anaphylaxis    Has patient had a PCN reaction causing immediate rash, facial/tongue/throat swelling, SOB or lightheadedness with hypotension: Yes Has patient had a PCN reaction causing severe rash involving mucus membranes or skin necrosis: Yes Has patient had a PCN reaction that required hospitalization Yes Has patient had a PCN reaction occurring within the last 10 years: No If all of the above answers are "NO", then may proceed with Cephalosporin use.  . Augmentin [Amoxicillin-Pot Clavulanate]   . Nickel Rash    ROS General: Denies fever, chills, night sweats, changes in weight, changes in appetite HEENT: Denies headaches, ear pain, changes in vision, rhinorrhea, sore throat  +HAs, dizziness, ?  Increased ear pressure CV: Denies CP, palpitations, SOB, orthopnea Pulm: Denies SOB, cough, wheezing GI: Denies abdominal pain, vomiting, diarrhea, constipation  +nausea GU: Denies dysuria, hematuria, frequency, vaginal discharge Msk: Denies muscle cramps, joint pains Neuro: Denies weakness, numbness, tingling Skin:  Denies rashes, bruising Psych: Denies depression, anxiety, hallucinations    Objective:    Temperature 98.4 F (36.9 C), temperature source Oral, weight 230 lb (104.3 kg), SpO2 97 %.  Orthostatic bps done.  Gen. Pleasant, well-nourished, in no distress, normal affect  HEENT: [place]/AT, face symmetric, no scleral icterus, PERRLA, EOMI, nares patent without drainage, pharynx without erythema or exudate. L TM normal.  R TM full with erythema and small amount of exudate.  Epley maneuver performed which induced vomiting Lungs: no accessory muscle use, CTAB, no wheezes or rales Cardiovascular: RRR, no m/r/g, no peripheral edema Abdomen: BS present, soft, NT/ND Neuro:  A&Ox3, CN II-XII intact, normal gait Skin:  Warm, no lesions/ rash  Wt Readings from Last 3 Encounters:  06/26/18 230 lb (104.3 kg)  03/08/18 231 lb (104.8 kg)  09/28/17 228 lb 12.8 oz (103.8 kg)    Lab Results  Component Value Date   WBC 5.5 03/08/2018   HGB 14.1 03/08/2018   HCT 40.1 03/08/2018   PLT 257.0 03/08/2018   GLUCOSE 126 (H) 09/28/2017   CHOL 215 (H) 03/08/2018   TRIG 208.0 (H) 03/08/2018   HDL 62.80 03/08/2018   LDLDIRECT 133.0 03/08/2018   LDLCALC 94 08/10/2016   ALT 24 07/13/2017   AST 28 07/13/2017   NA 136 09/28/2017   K 3.7 09/28/2017   CL 96 09/28/2017   CREATININE 0.73 09/28/2017   BUN 16 09/28/2017   CO2 32 09/28/2017   TSH 3.88 05/11/2016   INR 0.94 07/13/2017   HGBA1C 6.0 03/08/2018    Assessment/Plan:  Non-recurrent acute suppurative otitis media of right ear without spontaneous rupture of tympanic membrane -Okay to take Tylenol or ibuprofen as needed for any pain/discomfort or fever -Given handout - Plan: azithromycin (ZITHROMAX) 250 MG tablet  Vertigo -Discussed various causes -Consent obtained.  Epley maneuver performed, caused vomiting.  Pt  felt better after emesis -IM Zofran 4 mg given in clinic -Patient encouraged to stay hydrated -Given handout - Plan: meclizine (ANTIVERT)  25 MG tablet, ondansetron (ZOFRAN) 4 MG tablet, ondansetron (ZOFRAN) injection 4 mg  Given RTC or ED precautions.  Follow-up PRN  Grier Mitts, MD

## 2018-06-26 NOTE — Telephone Encounter (Signed)
Noted  

## 2018-06-26 NOTE — Telephone Encounter (Signed)
Patient stats she woke up feeling dizzy.  It gets worse when she turns her head to the right.  She is able to walk without difficulty.  Patient did have one episode of vomiting, and c/o of nausea.  Per patient she was diagnosed with vertigo years ago when she was seeing Dr. Keane Police.  Patient states she is afraid to drive and would prefer something be called in if possible.  Her sister could pick it up. / Spoke with Vita Barley, decided patient needs an appointment.  Appointment scheduled for 06-26-2018 at 3:00pm.  Patient in agreement. Reason for Disposition . [1] MODERATE dizziness (e.g., vertigo; feels very unsteady, interferes with normal activities) AND [2] has NOT been evaluated by physician for this . [1] MODERATE dizziness (e.g., vertigo; feels very unsteady, interferes with normal activities) AND [2] has been evaluated by physician for this  Answer Assessment - Initial Assessment Questions 1. DESCRIPTION: "Describe your dizziness."     nauseated  2. VERTIGO: "Do you feel like either you or the room is spinning or tilting?"   room is spinning 3. LIGHTHEADED: "Do you feel lightheaded?" (e.g., somewhat faint, woozy, weak upon standing)     Light headed when turning head to the right. 4. SEVERITY: "How bad is it?"  "Can you walk?"   - MILD - Feels unsteady but walking normally.    5. ONSET:  "When did the dizziness begin?"    Dizziness started this morning 6. AGGRAVATING FACTORS: "Does anything make it worse?" (e.g., standing, change in head position) When she turns head/body to right 7. CAUSE: "What do you think is causing the dizziness?"    Diagnosed with vertigo previously 8. RECURRENT SYMPTOM: "Have you had dizziness before?" If so, ask: "When was the last time?" "What happened that time?" Several years ago 9. OTHER SYMPTOMS: "Do you have any other symptoms?" (e.g., headache, weakness, numbness, vomiting, earache)    Nauseous  Protocols used: DIZZINESS - VERTIGO-A-AH

## 2018-06-27 DIAGNOSIS — Z803 Family history of malignant neoplasm of breast: Secondary | ICD-10-CM | POA: Diagnosis not present

## 2018-06-27 DIAGNOSIS — Z1231 Encounter for screening mammogram for malignant neoplasm of breast: Secondary | ICD-10-CM | POA: Diagnosis not present

## 2018-06-27 LAB — HM MAMMOGRAPHY

## 2018-06-30 ENCOUNTER — Encounter: Payer: Self-pay | Admitting: Family Medicine

## 2018-07-17 ENCOUNTER — Encounter: Payer: Self-pay | Admitting: Family Medicine

## 2018-07-20 DIAGNOSIS — M9901 Segmental and somatic dysfunction of cervical region: Secondary | ICD-10-CM | POA: Diagnosis not present

## 2018-07-20 DIAGNOSIS — M542 Cervicalgia: Secondary | ICD-10-CM | POA: Diagnosis not present

## 2018-07-20 DIAGNOSIS — M9903 Segmental and somatic dysfunction of lumbar region: Secondary | ICD-10-CM | POA: Diagnosis not present

## 2018-07-20 DIAGNOSIS — M545 Low back pain: Secondary | ICD-10-CM | POA: Diagnosis not present

## 2018-08-17 DIAGNOSIS — M542 Cervicalgia: Secondary | ICD-10-CM | POA: Diagnosis not present

## 2018-08-17 DIAGNOSIS — M9903 Segmental and somatic dysfunction of lumbar region: Secondary | ICD-10-CM | POA: Diagnosis not present

## 2018-08-17 DIAGNOSIS — M9901 Segmental and somatic dysfunction of cervical region: Secondary | ICD-10-CM | POA: Diagnosis not present

## 2018-08-17 DIAGNOSIS — M545 Low back pain: Secondary | ICD-10-CM | POA: Diagnosis not present

## 2018-08-25 DIAGNOSIS — H401132 Primary open-angle glaucoma, bilateral, moderate stage: Secondary | ICD-10-CM | POA: Diagnosis not present

## 2018-08-28 ENCOUNTER — Other Ambulatory Visit: Payer: Self-pay | Admitting: Family Medicine

## 2018-09-05 ENCOUNTER — Telehealth: Payer: Self-pay | Admitting: Family Medicine

## 2018-09-05 NOTE — Telephone Encounter (Signed)
Copied from [facility] [id number]. Topic: Quick Communication - Rx Refill/Question >> Sep 05, 2018 10:03 AM Sheran Luz wrote: Medication: venlafaxine XR (EFFEXOR-XR) 75 MG 24 hr capsule   Patient is requesting a refill of this medication.   Preferred Pharmacy (with phone number or street name):[id number] [address], [place] [phone] (Phone) [phone] (Fax)

## 2018-09-06 NOTE — Telephone Encounter (Signed)
Pt called stating the pharmacy said she was out of refills for this medication. Please advise.

## 2018-09-06 NOTE — Telephone Encounter (Signed)
Pt LOV was 06/26/2018 and last refill was 02/07/2018, ok to send refill

## 2018-09-06 NOTE — Telephone Encounter (Signed)
Ok to refill effexor XR 75 mg.  90 days with 2 refills.

## 2018-09-07 ENCOUNTER — Other Ambulatory Visit: Payer: Self-pay

## 2018-09-07 MED ORDER — VENLAFAXINE HCL ER 75 MG PO CP24: 75.0000 mg | ORAL_CAPSULE | Freq: Every day | ORAL | 1 refills | Status: DC

## 2018-09-07 NOTE — Telephone Encounter (Signed)
Rx sen to the pharmacy as requested

## 2018-09-21 DIAGNOSIS — M542 Cervicalgia: Secondary | ICD-10-CM | POA: Diagnosis not present

## 2018-09-21 DIAGNOSIS — M9901 Segmental and somatic dysfunction of cervical region: Secondary | ICD-10-CM | POA: Diagnosis not present

## 2018-09-21 DIAGNOSIS — M545 Low back pain: Secondary | ICD-10-CM | POA: Diagnosis not present

## 2018-09-21 DIAGNOSIS — M9903 Segmental and somatic dysfunction of lumbar region: Secondary | ICD-10-CM | POA: Diagnosis not present

## 2018-10-24 ENCOUNTER — Other Ambulatory Visit: Payer: Self-pay | Admitting: Family Medicine

## 2018-10-26 DIAGNOSIS — M9901 Segmental and somatic dysfunction of cervical region: Secondary | ICD-10-CM | POA: Diagnosis not present

## 2018-10-26 DIAGNOSIS — M542 Cervicalgia: Secondary | ICD-10-CM | POA: Diagnosis not present

## 2018-10-26 DIAGNOSIS — M545 Low back pain: Secondary | ICD-10-CM | POA: Diagnosis not present

## 2018-10-26 DIAGNOSIS — M9903 Segmental and somatic dysfunction of lumbar region: Secondary | ICD-10-CM | POA: Diagnosis not present

## 2018-11-09 DIAGNOSIS — D2262 Melanocytic nevi of left upper limb, including shoulder: Secondary | ICD-10-CM | POA: Diagnosis not present

## 2018-11-09 DIAGNOSIS — D2261 Melanocytic nevi of right upper limb, including shoulder: Secondary | ICD-10-CM | POA: Diagnosis not present

## 2018-11-09 DIAGNOSIS — D220 Melanocytic nevi of lip: Secondary | ICD-10-CM | POA: Diagnosis not present

## 2018-11-09 DIAGNOSIS — D692 Other nonthrombocytopenic purpura: Secondary | ICD-10-CM | POA: Diagnosis not present

## 2018-11-09 DIAGNOSIS — D225 Melanocytic nevi of trunk: Secondary | ICD-10-CM | POA: Diagnosis not present

## 2018-11-09 DIAGNOSIS — D2272 Melanocytic nevi of left lower limb, including hip: Secondary | ICD-10-CM | POA: Diagnosis not present

## 2018-11-09 DIAGNOSIS — L821 Other seborrheic keratosis: Secondary | ICD-10-CM | POA: Diagnosis not present

## 2018-11-09 DIAGNOSIS — D1801 Hemangioma of skin and subcutaneous tissue: Secondary | ICD-10-CM | POA: Diagnosis not present

## 2018-11-09 DIAGNOSIS — L570 Actinic keratosis: Secondary | ICD-10-CM | POA: Diagnosis not present

## 2018-11-09 DIAGNOSIS — L814 Other melanin hyperpigmentation: Secondary | ICD-10-CM | POA: Diagnosis not present

## 2018-11-16 ENCOUNTER — Telehealth: Payer: Self-pay

## 2018-11-16 DIAGNOSIS — Z13820 Encounter for screening for osteoporosis: Secondary | ICD-10-CM

## 2018-11-16 NOTE — Telephone Encounter (Signed)
Copied from [place] [id number]. Topic: General - Other >> Nov 14, 2018  2:15 PM Jodie Echevaria wrote: Reason for CRM: Patient insurance company called to say that patient need to be scheduled for a Bone Density exam. Please refer and contact patient with an appointment  Ph# [phone]

## 2018-11-23 DIAGNOSIS — M542 Cervicalgia: Secondary | ICD-10-CM | POA: Diagnosis not present

## 2018-11-23 DIAGNOSIS — M9903 Segmental and somatic dysfunction of lumbar region: Secondary | ICD-10-CM | POA: Diagnosis not present

## 2018-11-23 DIAGNOSIS — M9901 Segmental and somatic dysfunction of cervical region: Secondary | ICD-10-CM | POA: Diagnosis not present

## 2018-11-23 DIAGNOSIS — M545 Low back pain: Secondary | ICD-10-CM | POA: Diagnosis not present

## 2018-11-25 ENCOUNTER — Other Ambulatory Visit: Payer: Self-pay | Admitting: Family Medicine

## 2018-12-14 DIAGNOSIS — M9903 Segmental and somatic dysfunction of lumbar region: Secondary | ICD-10-CM | POA: Diagnosis not present

## 2018-12-14 DIAGNOSIS — M545 Low back pain: Secondary | ICD-10-CM | POA: Diagnosis not present

## 2018-12-14 DIAGNOSIS — M9901 Segmental and somatic dysfunction of cervical region: Secondary | ICD-10-CM | POA: Diagnosis not present

## 2018-12-14 DIAGNOSIS — M542 Cervicalgia: Secondary | ICD-10-CM | POA: Diagnosis not present

## 2018-12-19 DIAGNOSIS — H401132 Primary open-angle glaucoma, bilateral, moderate stage: Secondary | ICD-10-CM | POA: Diagnosis not present

## 2019-01-18 DIAGNOSIS — M542 Cervicalgia: Secondary | ICD-10-CM | POA: Diagnosis not present

## 2019-01-18 DIAGNOSIS — M545 Low back pain: Secondary | ICD-10-CM | POA: Diagnosis not present

## 2019-01-18 DIAGNOSIS — M9901 Segmental and somatic dysfunction of cervical region: Secondary | ICD-10-CM | POA: Diagnosis not present

## 2019-01-18 DIAGNOSIS — M9903 Segmental and somatic dysfunction of lumbar region: Secondary | ICD-10-CM | POA: Diagnosis not present

## 2019-02-15 DIAGNOSIS — M9901 Segmental and somatic dysfunction of cervical region: Secondary | ICD-10-CM | POA: Diagnosis not present

## 2019-02-15 DIAGNOSIS — M542 Cervicalgia: Secondary | ICD-10-CM | POA: Diagnosis not present

## 2019-02-15 DIAGNOSIS — M545 Low back pain: Secondary | ICD-10-CM | POA: Diagnosis not present

## 2019-02-15 DIAGNOSIS — M9903 Segmental and somatic dysfunction of lumbar region: Secondary | ICD-10-CM | POA: Diagnosis not present

## 2019-02-25 ENCOUNTER — Other Ambulatory Visit: Payer: Self-pay | Admitting: Family Medicine

## 2019-03-07 ENCOUNTER — Other Ambulatory Visit: Payer: Self-pay | Admitting: Family Medicine

## 2019-03-07 DIAGNOSIS — I10 Essential (primary) hypertension: Secondary | ICD-10-CM

## 2019-03-15 DIAGNOSIS — M545 Low back pain: Secondary | ICD-10-CM | POA: Diagnosis not present

## 2019-03-15 DIAGNOSIS — M9901 Segmental and somatic dysfunction of cervical region: Secondary | ICD-10-CM | POA: Diagnosis not present

## 2019-03-15 DIAGNOSIS — M542 Cervicalgia: Secondary | ICD-10-CM | POA: Diagnosis not present

## 2019-03-15 DIAGNOSIS — M9903 Segmental and somatic dysfunction of lumbar region: Secondary | ICD-10-CM | POA: Diagnosis not present

## 2019-04-20 ENCOUNTER — Other Ambulatory Visit: Payer: Self-pay | Admitting: Family Medicine

## 2019-04-20 NOTE — Telephone Encounter (Signed)
Pt needs appointment for further refills 

## 2019-04-23 DIAGNOSIS — M9901 Segmental and somatic dysfunction of cervical region: Secondary | ICD-10-CM | POA: Diagnosis not present

## 2019-04-23 DIAGNOSIS — M542 Cervicalgia: Secondary | ICD-10-CM | POA: Diagnosis not present

## 2019-04-23 DIAGNOSIS — M545 Low back pain: Secondary | ICD-10-CM | POA: Diagnosis not present

## 2019-04-23 DIAGNOSIS — M9903 Segmental and somatic dysfunction of lumbar region: Secondary | ICD-10-CM | POA: Diagnosis not present

## 2019-05-10 DIAGNOSIS — M545 Low back pain: Secondary | ICD-10-CM | POA: Diagnosis not present

## 2019-05-10 DIAGNOSIS — M9903 Segmental and somatic dysfunction of lumbar region: Secondary | ICD-10-CM | POA: Diagnosis not present

## 2019-05-10 DIAGNOSIS — M542 Cervicalgia: Secondary | ICD-10-CM | POA: Diagnosis not present

## 2019-05-10 DIAGNOSIS — M9901 Segmental and somatic dysfunction of cervical region: Secondary | ICD-10-CM | POA: Diagnosis not present

## 2019-05-22 ENCOUNTER — Other Ambulatory Visit: Payer: Self-pay | Admitting: Family Medicine

## 2019-05-22 NOTE — Telephone Encounter (Signed)
Needs appointment for further refills

## 2019-05-30 ENCOUNTER — Other Ambulatory Visit: Payer: Self-pay | Admitting: Family Medicine

## 2019-05-30 DIAGNOSIS — I10 Essential (primary) hypertension: Secondary | ICD-10-CM

## 2019-05-30 NOTE — Telephone Encounter (Signed)
Pt needs appointment for further refills 

## 2019-06-07 DIAGNOSIS — M545 Low back pain: Secondary | ICD-10-CM | POA: Diagnosis not present

## 2019-06-07 DIAGNOSIS — M542 Cervicalgia: Secondary | ICD-10-CM | POA: Diagnosis not present

## 2019-06-07 DIAGNOSIS — M9903 Segmental and somatic dysfunction of lumbar region: Secondary | ICD-10-CM | POA: Diagnosis not present

## 2019-06-07 DIAGNOSIS — M9901 Segmental and somatic dysfunction of cervical region: Secondary | ICD-10-CM | POA: Diagnosis not present

## 2019-06-26 DIAGNOSIS — H401132 Primary open-angle glaucoma, bilateral, moderate stage: Secondary | ICD-10-CM | POA: Diagnosis not present

## 2019-07-05 DIAGNOSIS — M542 Cervicalgia: Secondary | ICD-10-CM | POA: Diagnosis not present

## 2019-07-05 DIAGNOSIS — M545 Low back pain: Secondary | ICD-10-CM | POA: Diagnosis not present

## 2019-07-05 DIAGNOSIS — M9903 Segmental and somatic dysfunction of lumbar region: Secondary | ICD-10-CM | POA: Diagnosis not present

## 2019-07-05 DIAGNOSIS — M9901 Segmental and somatic dysfunction of cervical region: Secondary | ICD-10-CM | POA: Diagnosis not present

## 2019-07-11 DIAGNOSIS — H401132 Primary open-angle glaucoma, bilateral, moderate stage: Secondary | ICD-10-CM | POA: Diagnosis not present

## 2019-07-13 ENCOUNTER — Other Ambulatory Visit: Payer: Self-pay | Admitting: Family Medicine

## 2019-07-15 ENCOUNTER — Other Ambulatory Visit: Payer: Self-pay | Admitting: Family Medicine

## 2019-08-02 DIAGNOSIS — M9901 Segmental and somatic dysfunction of cervical region: Secondary | ICD-10-CM | POA: Diagnosis not present

## 2019-08-02 DIAGNOSIS — M9903 Segmental and somatic dysfunction of lumbar region: Secondary | ICD-10-CM | POA: Diagnosis not present

## 2019-08-02 DIAGNOSIS — M545 Low back pain: Secondary | ICD-10-CM | POA: Diagnosis not present

## 2019-08-02 DIAGNOSIS — M542 Cervicalgia: Secondary | ICD-10-CM | POA: Diagnosis not present

## 2019-08-21 DIAGNOSIS — Z1231 Encounter for screening mammogram for malignant neoplasm of breast: Secondary | ICD-10-CM | POA: Diagnosis not present

## 2019-08-21 LAB — HM MAMMOGRAPHY

## 2019-08-22 ENCOUNTER — Telehealth: Payer: PPO | Admitting: Family Medicine

## 2019-08-22 ENCOUNTER — Encounter: Payer: Self-pay | Admitting: Family Medicine

## 2019-08-22 ENCOUNTER — Other Ambulatory Visit: Payer: Self-pay | Admitting: Family Medicine

## 2019-08-22 ENCOUNTER — Other Ambulatory Visit: Payer: Self-pay

## 2019-08-22 MED ORDER — AMLODIPINE BESYLATE 10 MG PO TABS: 10.0000 mg | ORAL_TABLET | Freq: Every day | ORAL | 3 refills | Status: DC

## 2019-08-22 MED ORDER — HYDROCHLOROTHIAZIDE 25 MG PO TABS: 25.0000 mg | ORAL_TABLET | Freq: Every day | ORAL | 3 refills | Status: DC

## 2019-08-22 MED ORDER — ROSUVASTATIN CALCIUM 10 MG PO TABS: 10.0000 mg | ORAL_TABLET | Freq: Every day | ORAL | 3 refills | Status: DC

## 2019-08-22 NOTE — Progress Notes (Signed)
Virtual Visit via Telephone Note  I connected with Susan Jenkins on 08/22/19 at  2:00 PM EDT by telephone and verified that I am speaking with the correct person using two identifiers.   I discussed the limitations, risks, security and privacy concerns of performing an evaluation and management service by telephone and the availability of in person appointments. I also discussed with the patient that there may be a patient responsible charge related to this service. The patient expressed understanding and agreed to proceed.  Location patient: home Location provider: work or home office Participants present for the call: patient, provider Patient did not have a visit in the prior 7 days to address this/these issue(s).   History of Present Illness: Pt requesting refills and wanted to schedule a CPE.  Was initially planning on having physical done at home with [facility].  Observations/Objective: Patient sounds cheerful and well on the phone. I do not appreciate any SOB. Speech and thought processing are grossly intact. Patient reported vitals:  Assessment and Plan: Hyperlipidemia, unspecified hyperlipidemia type  - Plan: rosuvastatin (CRESTOR) 10 MG tablet  Essential hypertension  - Plan: amLODipine (NORVASC) 10 MG tablet, hydrochlorothiazide (HYDRODIURIL) 25 MG tablet  Pt to f/u on Monday for CPE  Follow Up Instructions:  I did not refer this patient for an OV in the next 24 hours for this/these issue(s).  I discussed the assessment and treatment plan with the patient. The patient was provided an opportunity to ask questions and all were answered. The patient agreed with the plan and demonstrated an understanding of the instructions.   The patient was advised to call back or seek an in-person evaluation if the symptoms worsen or if the condition fails to improve as anticipated.  I provided 7 minutes of non-face-to-face time during this encounter.   Billie Ruddy,  MD

## 2019-08-22 NOTE — Telephone Encounter (Signed)
Left a message for pt to call the office and schedule an f/u appointment for her medication refill

## 2019-08-24 ENCOUNTER — Other Ambulatory Visit: Payer: Self-pay

## 2019-08-24 NOTE — Progress Notes (Signed)
Programs encounter-disregard.

## 2019-08-27 ENCOUNTER — Other Ambulatory Visit: Payer: Self-pay

## 2019-08-27 ENCOUNTER — Ambulatory Visit (INDEPENDENT_AMBULATORY_CARE_PROVIDER_SITE_OTHER): Payer: PPO | Admitting: Family Medicine

## 2019-08-27 ENCOUNTER — Encounter: Payer: Self-pay | Admitting: Family Medicine

## 2019-08-27 VITALS — BP 132/60 | HR 50 | Temp 97.9°F | Wt 234.0 lb

## 2019-08-27 DIAGNOSIS — E782 Mixed hyperlipidemia: Secondary | ICD-10-CM | POA: Diagnosis not present

## 2019-08-27 DIAGNOSIS — H65191 Other acute nonsuppurative otitis media, right ear: Secondary | ICD-10-CM | POA: Diagnosis not present

## 2019-08-27 DIAGNOSIS — W57XXXA Bitten or stung by nonvenomous insect and other nonvenomous arthropods, initial encounter: Secondary | ICD-10-CM | POA: Diagnosis not present

## 2019-08-27 DIAGNOSIS — R2689 Other abnormalities of gait and mobility: Secondary | ICD-10-CM | POA: Diagnosis not present

## 2019-08-27 DIAGNOSIS — R0989 Other specified symptoms and signs involving the circulatory and respiratory systems: Secondary | ICD-10-CM | POA: Diagnosis not present

## 2019-08-27 DIAGNOSIS — H9193 Unspecified hearing loss, bilateral: Secondary | ICD-10-CM | POA: Diagnosis not present

## 2019-08-27 DIAGNOSIS — Z0000 Encounter for general adult medical examination without abnormal findings: Secondary | ICD-10-CM

## 2019-08-27 DIAGNOSIS — R7303 Prediabetes: Secondary | ICD-10-CM | POA: Diagnosis not present

## 2019-08-27 DIAGNOSIS — I10 Essential (primary) hypertension: Secondary | ICD-10-CM

## 2019-08-27 DIAGNOSIS — Z780 Asymptomatic menopausal state: Secondary | ICD-10-CM

## 2019-08-27 DIAGNOSIS — K219 Gastro-esophageal reflux disease without esophagitis: Secondary | ICD-10-CM

## 2019-08-27 LAB — COMPREHENSIVE METABOLIC PANEL
ALT: 25 U/L (ref 0–35)
AST: 26 U/L (ref 0–37)
Albumin: 4.6 g/dL (ref 3.5–5.2)
Alkaline Phosphatase: 52 U/L (ref 39–117)
BUN: 16 mg/dL (ref 6–23)
CO2: 33 mEq/L — ABNORMAL HIGH (ref 19–32)
Calcium: 10.2 mg/dL (ref 8.4–10.5)
Chloride: 100 mEq/L (ref 96–112)
Creatinine, Ser: 0.88 mg/dL (ref 0.40–1.20)
GFR: 63.26 mL/min (ref >60.00)
Glucose, Bld: 105 mg/dL — ABNORMAL HIGH (ref 70–99)
Potassium: 4.5 mEq/L (ref 3.5–5.1)
Sodium: 140 mEq/L (ref 135–145)
Total Bilirubin: 0.5 mg/dL (ref 0.2–1.2)
Total Protein: 6.9 g/dL (ref 6.0–8.3)

## 2019-08-27 LAB — CBC
HCT: 39.3 % (ref 36.0–46.0)
Hemoglobin: 13.7 g/dL (ref 12.0–15.0)
MCHC: 34.8 g/dL (ref 30.0–36.0)
MCV: 101.1 fl — ABNORMAL HIGH (ref 78.0–100.0)
Platelets: 243 10*3/uL (ref 150.0–400.0)
RBC: 3.89 Mil/uL (ref 3.87–5.11)
RDW: 13.2 % (ref 11.5–15.5)
WBC: 5.4 10*3/uL (ref 4.0–10.5)

## 2019-08-27 LAB — LIPID PANEL
Cholesterol: 202 mg/dL — ABNORMAL HIGH (ref 0–200)
HDL: 63.7 mg/dL (ref >39.00)
LDL Cholesterol: 107 mg/dL — ABNORMAL HIGH (ref 0–99)
NonHDL: 137.83
Total CHOL/HDL Ratio: 3
Triglycerides: 155 mg/dL — ABNORMAL HIGH (ref 0.0–149.0)
VLDL: 31 mg/dL (ref 0.0–40.0)

## 2019-08-27 LAB — HEMOGLOBIN A1C: Hgb A1c MFr Bld: 6.1 % (ref 4.6–6.5)

## 2019-08-27 MED ORDER — DOXYCYCLINE HYCLATE 100 MG PO TABS: 100.0000 mg | ORAL_TABLET | Freq: Two times a day (BID) | ORAL | 0 refills | Status: AC

## 2019-08-27 NOTE — Patient Instructions (Signed)
Preventive Care 51 Years and Older, Female Preventive care refers to lifestyle choices and visits with your health care provider that can promote health and wellness. This includes:  A yearly physical exam. This is also called an annual well check.  Regular dental and eye exams.  Immunizations.  Screening for certain conditions.  Healthy lifestyle choices, such as diet and exercise. What can I expect for my preventive care visit? Physical exam Your health care provider will check:  Height and weight. These may be used to calculate body mass index (BMI), which is a measurement that tells if you are at a healthy weight.  Heart rate and blood pressure.  Your skin for abnormal spots. Counseling Your health care provider may ask you questions about:  Alcohol, tobacco, and drug use.  Emotional well-being.  Home and relationship well-being.  Sexual activity.  Eating habits.  History of falls.  Memory and ability to understand (cognition).  Work and work Statistician.  Pregnancy and menstrual history. What immunizations do I need?  Influenza (flu) vaccine  This is recommended every year. Tetanus, diphtheria, and pertussis (Tdap) vaccine  You may need a Td booster every 10 years. Varicella (chickenpox) vaccine  You may need this vaccine if you have not already been vaccinated. Zoster (shingles) vaccine  You may need this after age 39. Pneumococcal conjugate (PCV13) vaccine  One dose is recommended after age 73. Pneumococcal polysaccharide (PPSV23) vaccine  One dose is recommended after age 13. Measles, mumps, and rubella (MMR) vaccine  You may need at least one dose of MMR if you were born in 1957 or later. You may also need a second dose. Meningococcal conjugate (MenACWY) vaccine  You may need this if you have certain conditions. Hepatitis A vaccine  You may need this if you have certain conditions or if you travel or work in places where you may be exposed  to hepatitis A. Hepatitis B vaccine  You may need this if you have certain conditions or if you travel or work in places where you may be exposed to hepatitis B. Haemophilus influenzae type b (Hib) vaccine  You may need this if you have certain conditions. You may receive vaccines as individual doses or as more than one vaccine together in one shot (combination vaccines). Talk with your health care provider about the risks and benefits of combination vaccines. What tests do I need? Blood tests  Lipid and cholesterol levels. These may be checked every 5 years, or more frequently depending on your overall health.  Hepatitis C test.  Hepatitis B test. Screening  Lung cancer screening. You may have this screening every year starting at age 69 if you have a 30-pack-year history of smoking and currently smoke or have quit within the past 15 years.  Colorectal cancer screening. All adults should have this screening starting at age 71 and continuing until age 4. Your health care provider may recommend screening at age 64 if you are at increased risk. You will have tests every 1-10 years, depending on your results and the type of screening test.  Diabetes screening. This is done by checking your blood sugar (glucose) after you have not eaten for a while (fasting). You may have this done every 1-3 years.  Mammogram. This may be done every 1-2 years. Talk with your health care provider about how often you should have regular mammograms.  BRCA-related cancer screening. This may be done if you have a family history of breast, ovarian, tubal, or peritoneal cancers.  Other tests  Sexually transmitted disease (STD) testing.  Bone density scan. This is done to screen for osteoporosis. You may have this done starting at age 68. Follow these instructions at home: Eating and drinking  Eat a diet that includes fresh fruits and vegetables, whole grains, lean protein, and low-fat dairy products. Limit  your intake of foods with high amounts of sugar, saturated fats, and salt.  Take vitamin and mineral supplements as recommended by your health care provider.  Do not drink alcohol if your health care provider tells you not to drink.  If you drink alcohol: ? Limit how much you have to 0-1 drink a day. ? Be aware of how much alcohol is in your drink. In the [place] one drink equals one 12 oz bottle of beer (355 mL), one 5 oz glass of wine (148 mL), or one 1 oz glass of hard liquor (44 mL). Lifestyle  Take daily care of your teeth and gums.  Stay active. Exercise for at least 30 minutes on 5 or more days each week.  Do not use any products that contain nicotine or tobacco, such as cigarettes, e-cigarettes, and chewing tobacco. If you need help quitting, ask your health care provider.  If you are sexually active, practice safe sex. Use a condom or other form of protection in order to prevent STIs (sexually transmitted infections).  Talk with your health care provider about taking a low-dose aspirin or statin. What's next?  Go to your health care provider once a year for a well check visit.  Ask your health care provider how often you should have your eyes and teeth checked.  Stay up to date on all vaccines. This information is not intended to replace advice given to you by your health care provider. Make sure you discuss any questions you have with your health care provider. Document Revised: 04/13/2018 Document Reviewed: 04/13/2018 Elsevier Patient Education  [employer].  Prediabetes Prediabetes is the condition of having a blood sugar (blood glucose) level that is higher than it should be, but not high enough for you to be diagnosed with type 2 diabetes. Having prediabetes puts you at risk for developing type 2 diabetes (type 2 diabetes mellitus). Prediabetes may be called impaired glucose tolerance or impaired fasting glucose. Prediabetes usually does not cause symptoms. Your  health care provider can diagnose this condition with blood tests. You may be tested for prediabetes if you are overweight and if you have at least one other risk factor for prediabetes. What is blood glucose, and how is it measured? Blood glucose refers to the amount of glucose in your bloodstream. Glucose comes from eating foods that contain sugars and starches (carbohydrates), which the body breaks down into glucose. Your blood glucose level may be measured in mg/dL (milligrams per deciliter) or mmol/L (millimoles per liter). Your blood glucose may be checked with one or more of the following blood tests:  A fasting blood glucose (FBG) test. You will not be allowed to eat (you will fast) for 8 hours or longer before a blood sample is taken. ? A normal range for FBG is 70-100 mg/dl (3.9-5.6 mmol/L).  An A1c (hemoglobin A1c) blood test. This test provides information about blood glucose control over the previous 2?69month.  An oral glucose tolerance test (OGTT). This test measures your blood glucose at two times: ? After fasting. This is your baseline level. ? Two hours after you drink a beverage that contains glucose. You may be diagnosed with  prediabetes:  If your FBG is 100?125 mg/dL (5.6-6.9 mmol/L).  If your A1c level is 5.7?6.4%.  If your OGTT result is 140?199 mg/dL (7.8-11 mmol/L). These blood tests may be repeated to confirm your diagnosis. How can this condition affect me? The pancreas produces a hormone (insulin) that helps to move glucose from the bloodstream into cells. When cells in the body do not respond properly to insulin that the body makes (insulin resistance), excess glucose builds up in the blood instead of going into cells. As a result, high blood glucose (hyperglycemia) can develop, which can cause many complications. Hyperglycemia is a symptom of prediabetes. Having high blood glucose for a long time is dangerous. Too much glucose in your blood can damage your nerves  and blood vessels. Long-term damage can lead to complications from diabetes, which may include:  Heart disease.  Stroke.  Blindness.  Kidney disease.  Depression.  Poor circulation in the feet and legs, which could lead to surgical removal (amputation) in severe cases. What can increase my risk? Risk factors for prediabetes include:  Having a family member with type 2 diabetes.  Being overweight or obese.  Being older than age 58.  Being of American [place], African-American, Hispanic/Latino, or Asian/Pacific Islander descent.  Having an inactive (sedentary) lifestyle.  Having a history of heart disease.  History of gestational diabetes or polycystic ovary syndrome (PCOS), in women.  Having low levels of good cholesterol (HDL-C) or high levels of blood fats (triglycerides).  Having high blood pressure. What actions can I take to prevent diabetes?      Be physically active. ? Do moderate-intensity physical activity for 30 or more minutes on 5 or more days of the week, or as much as told by your health care provider. This could be brisk walking, biking, or water aerobics. ? Ask your health care provider what activities are safe for you. A mix of physical activities may be best, such as walking, swimming, cycling, and strength training.  Lose weight as told by your health care provider. ? Losing 5-7% of your body weight can reverse insulin resistance. ? Your health care provider can determine how much weight loss is best for you and can help you lose weight safely.  Follow a healthy meal plan. This includes eating lean proteins, complex carbohydrates, fresh fruits and vegetables, low-fat dairy products, and healthy fats. ? Follow instructions from your health care provider about eating or drinking restrictions. ? Make an appointment to see a diet and nutrition specialist (registered dietitian) to help you create a healthy eating plan that is right for you.  Do not smoke  or use any tobacco products, such as cigarettes, chewing tobacco, and e-cigarettes. If you need help quitting, ask your health care provider.  Take over-the-counter and prescription medicines as told by your health care provider. You may be prescribed medicines that help lower the risk of type 2 diabetes.  Keep all follow-up visits as told by your health care provider. This is important. Summary  Prediabetes is the condition of having a blood sugar (blood glucose) level that is higher than it should be, but not high enough for you to be diagnosed with type 2 diabetes.  Having prediabetes puts you at risk for developing type 2 diabetes (type 2 diabetes mellitus).  To help prevent type 2 diabetes, make lifestyle changes such as being physically active and eating a healthy diet. Lose weight as told by your health care provider. This information is not intended to replace  advice given to you by your health care provider. Make sure you discuss any questions you have with your health care provider. Document Revised: 08/11/2018 Document Reviewed: 06/10/2015 Elsevier Patient Education  [place], Adult  Ticks are insects that draw blood for food. Most ticks live in shrubs and grassy areas. They climb onto people and animals that brush against the leaves and grasses that they rest on. Then they bite, attaching themselves to the skin. Most ticks are harmless, but some ticks carry germs that can spread to a person through a bite and cause a disease. To reduce your risk of getting a disease from a tick bite, it is important to take steps to prevent tick bites. It is also important to check for ticks after being outdoors. If you find that a tick has attached to you, watch for symptoms of disease. How can I prevent tick bites? Take these steps to help prevent tick bites when you are outdoors in an area where ticks are found:  Use insect repellent that has DEET (20% or  higher), picaridin, or IR3535 in it. Use it on: ? Skin that is showing. ? The top of your boots. ? Your pant legs. ? Your sleeve cuffs.  For repellent products that contain permethrin, follow product instructions. Use these products on: ? Clothing. ? Gear. ? Boots. ? Tents.  Wear protective clothing. Long sleeves and long pants offer the best protection from ticks.  Wear light-colored clothing so you can see ticks more easily.  Tuck your pant legs into your socks.  If you go walking on a trail, stay in the middle of the trail so your skin, hair, and clothing do not touch the bushes.  Avoid walking through areas with long grass.  Check for ticks on your clothing, hair, and skin often while you are outside, and check again before you go inside. Make sure to check the places that ticks attach themselves most often. These places include the scalp, neck, armpits, waist, groin, and joint areas. Ticks that carry a disease called Lyme disease have to be attached to the skin for 24-48 hours. Checking for ticks every day will lessen your risk of this and other diseases.  When you come indoors, wash your clothes and take a shower or a bath right away. Dry your clothes in a dryer on high heat for at least 60 minutes. This will kill any ticks in your clothes. What is the proper way to remove a tick? If you find a tick on your body, remove it as soon as possible. Removing a tick sooner rather than later can prevent germs from passing from the tick to your body. To remove a tick that is crawling on your skin but has not bitten:  Go outdoors and brush the tick off.  Remove the tick with tape or a lint roller. To remove a tick that is attached to your skin:  Wash your hands.  If you have latex gloves, put them on.  Use tweezers, curved forceps, or a tick-removal tool to gently grasp the tick as close to your skin and the tick's head as possible.  Gently pull with steady, upward pressure until  the tick lets go. When removing the tick: ? Take care to keep the tick's head attached to its body. ? Do not twist or jerk the tick. This can make the tick's head or mouth break off. ? Do not squeeze or crush the tick's body. This  could force disease-carrying fluids from the tick into your body. Do not try to remove a tick with heat, alcohol, petroleum jelly, or fingernail polish. Using these methods can cause the tick to salivate and regurgitate into your bloodstream, increasing your risk of getting a disease. What should I do after removing a tick?  Clean the bite area with soap and water, rubbing alcohol, or an iodine scrub.  If an antiseptic cream or ointment is available, apply a small amount to the bite site.  Wash and disinfect any instruments that you used to remove the tick. How should I dispose of a tick? To dispose of a live tick, use one of these methods:  Place it in rubbing alcohol.  Place it in a sealed bag or container.  Wrap it tightly in tape.  Flush it down the toilet. Contact a health care provider if:  You have symptoms of a disease after a tick bite. Symptoms of a tick-borne disease can occur from moments after the tick bites to up to 30 days after a tick is removed. Symptoms include: ? Muscle, joint, or bone pain. ? Difficulty walking or moving your legs. ? Numbness in the legs. ? Paralysis. ? Red rash around the tick bite area that is shaped like a target or a "bull's-eye." ? Redness and swelling in the area of the tick bite. ? Fever. ? Repeated vomiting. ? Diarrhea. ? Weight loss. ? Tender, swollen lymph glands. ? Shortness of breath. ? Cough. ? Pain in the abdomen. ? Headache. ? Abnormal tiredness. ? A change in your level of consciousness. ? Confusion. Get help right away if:  You are not able to remove a tick.  A part of a tick breaks off and gets stuck in your skin.  Your symptoms get worse. Summary  Ticks may carry germs that can  spread to a person through a bite and cause disease.  Wear protective clothing and use insect repellent to prevent tick bites. Follow product instructions.  If you find a tick on your body, remove it as soon as possible. If the tick is attached, do not try to remove with heat, alcohol, petroleum jelly, or fingernail polish.  Remove the attached tick using tweezers, curved forceps, or a tick-removal tool. Gently pull with steady, upward pressure until the tick lets go. Do not twist or jerk the tick. Do not squeeze or crush the tick's body.  If you have symptoms after being bitten by a tick, contact a health care provider. This information is not intended to replace advice given to you by your health care provider. Make sure you discuss any questions you have with your health care provider. Document Revised: 04/01/2017 Document Reviewed: 01/30/2016 Elsevier Patient Education  [place].  Gastroesophageal Reflux Disease, Adult Gastroesophageal reflux (GER) happens when acid from the stomach flows up into the tube that connects the mouth and the stomach (esophagus). Normally, food travels down the esophagus and stays in the stomach to be digested. With GER, food and stomach acid sometimes move back up into the esophagus. You may have a disease called gastroesophageal reflux disease (GERD) if the reflux:  Happens often.  Causes frequent or very bad symptoms.  Causes problems such as damage to the esophagus. When this happens, the esophagus becomes sore and swollen (inflamed). Over time, GERD can make small holes (ulcers) in the lining of the esophagus. What are the causes? This condition is caused by a problem with the muscle between the esophagus and  the stomach. When this muscle is weak or not normal, it does not close properly to keep food and acid from coming back up from the stomach. The muscle can be weak because of:  Tobacco use.  Pregnancy.  Having a certain type of hernia  (hiatal hernia).  Alcohol use.  Certain foods and drinks, such as coffee, chocolate, onions, and peppermint. What increases the risk? You are more likely to develop this condition if you:  Are overweight.  Have a disease that affects your connective tissue.  Use NSAID medicines. What are the signs or symptoms? Symptoms of this condition include:  Heartburn.  Difficult or painful swallowing.  The feeling of having a lump in the throat.  A bitter taste in the mouth.  Bad breath.  Having a lot of saliva.  Having an upset or bloated stomach.  Belching.  Chest pain. Different conditions can cause chest pain. Make sure you see your doctor if you have chest pain.  Shortness of breath or noisy breathing (wheezing).  Ongoing (chronic) cough or a cough at night.  Wearing away of the surface of teeth (tooth enamel).  Weight loss. How is this treated? Treatment will depend on how bad your symptoms are. Your doctor may suggest:  Changes to your diet.  Medicine.  Surgery. Follow these instructions at home: Eating and drinking   Follow a diet as told by your doctor. You may need to avoid foods and drinks such as: ? Coffee and tea (with or without caffeine). ? Drinks that contain alcohol. ? Energy drinks and sports drinks. ? Bubbly (carbonated) drinks or sodas. ? Chocolate and cocoa. ? Peppermint and mint flavorings. ? Garlic and onions. ? Horseradish. ? Spicy and acidic foods. These include peppers, chili powder, curry powder, vinegar, hot sauces, and BBQ sauce. ? Citrus fruit juices and citrus fruits, such as oranges, lemons, and limes. ? Tomato-based foods. These include red sauce, chili, salsa, and pizza with red sauce. ? Fried and fatty foods. These include donuts, french fries, potato chips, and high-fat dressings. ? High-fat meats. These include hot dogs, rib eye steak, sausage, ham, and bacon. ? High-fat dairy items, such as whole milk, butter, and cream  cheese.  Eat small meals often. Avoid eating large meals.  Avoid drinking large amounts of liquid with your meals.  Avoid eating meals during the 2-3 hours before bedtime.  Avoid lying down right after you eat.  Do not exercise right after you eat. Lifestyle   Do not use any products that contain nicotine or tobacco. These include cigarettes, e-cigarettes, and chewing tobacco. If you need help quitting, ask your doctor.  Try to lower your stress. If you need help doing this, ask your doctor.  If you are overweight, lose an amount of weight that is healthy for you. Ask your doctor about a safe weight loss goal. General instructions  Pay attention to any changes in your symptoms.  Take over-the-counter and prescription medicines only as told by your doctor. Do not take aspirin, ibuprofen, or other NSAIDs unless your doctor says it is okay.  Wear loose clothes. Do not wear anything tight around your waist.  Raise (elevate) the head of your bed about 6 inches (15 cm).  Avoid bending over if this makes your symptoms worse.  Keep all follow-up visits as told by your doctor. This is important. Contact a doctor if:  You have new symptoms.  You lose weight and you do not know why.  You have trouble swallowing  or it hurts to swallow.  You have wheezing or a cough that keeps happening.  Your symptoms do not get better with treatment.  You have a hoarse voice. Get help right away if:  You have pain in your arms, neck, jaw, teeth, or back.  You feel sweaty, dizzy, or light-headed.  You have chest pain or shortness of breath.  You throw up (vomit) and your throw-up looks like blood or coffee grounds.  You pass out (faint).  Your poop (stool) is bloody or black.  You cannot swallow, drink, or eat. Summary  If a person has gastroesophageal reflux disease (GERD), food and stomach acid move back up into the esophagus and cause symptoms or problems such as damage to the  esophagus.  Treatment will depend on how bad your symptoms are.  Follow a diet as told by your doctor.  Take all medicines only as told by your doctor. This information is not intended to replace advice given to you by your health care provider. Make sure you discuss any questions you have with your health care provider. Document Revised: 10/26/2017 Document Reviewed: 10/26/2017 Elsevier Patient Education  2020 [place] for Gastroesophageal Reflux Disease, Adult When you have gastroesophageal reflux disease (GERD), the foods you eat and your eating habits are very important. Choosing the right foods can help ease your discomfort. Think about working with a nutrition specialist (dietitian) to help you make good choices. What are tips for following this plan?  Meals  Choose healthy foods that are low in fat, such as fruits, vegetables, whole grains, low-fat dairy products, and lean meat, fish, and poultry.  Eat small meals often instead of 3 large meals a day. Eat your meals slowly, and in a place where you are relaxed. Avoid bending over or lying down until 2-3 hours after eating.  Avoid eating meals 2-3 hours before bed.  Avoid drinking a lot of liquid with meals.  Cook foods using methods other than frying. Bake, grill, or broil food instead.  Avoid or limit: ? Chocolate. ? Peppermint or spearmint. ? Alcohol. ? Pepper. ? Black and decaffeinated coffee. ? Black and decaffeinated tea. ? Bubbly (carbonated) soft drinks. ? Caffeinated energy drinks and soft drinks.  Limit high-fat foods such as: ? Fatty meat or fried foods. ? Whole milk, cream, butter, or ice cream. ? Nuts and nut butters. ? Pastries, donuts, and sweets made with butter or shortening.  Avoid foods that cause symptoms. These foods may be different for everyone. Common foods that cause symptoms include: ? Tomatoes. ? Oranges, lemons, and limes. ? Peppers. ? Spicy food. ? Onions and  garlic. ? Vinegar. Lifestyle  Maintain a healthy weight. Ask your doctor what weight is healthy for you. If you need to lose weight, work with your doctor to do so safely.  Exercise for at least 30 minutes for 5 or more days each week, or as told by your doctor.  Wear loose-fitting clothes.  Do not smoke. If you need help quitting, ask your doctor.  Sleep with the head of your bed higher than your feet. Use a wedge under the mattress or blocks under the bed frame to raise the head of the bed. Summary  When you have gastroesophageal reflux disease (GERD), food and lifestyle choices are very important in easing your symptoms.  Eat small meals often instead of 3 large meals a day. Eat your meals slowly, and in a place where you are relaxed.  Limit high-fat  foods such as fatty meat or fried foods.  Avoid bending over or lying down until 2-3 hours after eating.  Avoid peppermint and spearmint, caffeine, alcohol, and chocolate. This information is not intended to replace advice given to you by your health care provider. Make sure you discuss any questions you have with your health care provider. Document Revised: 08/10/2018 Document Reviewed: 05/25/2016 [employer] Patient Education  [place].

## 2019-08-28 NOTE — Progress Notes (Signed)
Subjective:    Susan Jenkins is a 74 y.o. female who presents for Medicare Annual/Subsequent preventive examination.  Pt doing well.  Has an appt with Dr. [name] for a series of L knee injections.  Pt hoping to avoid TKR if possible.  Had R TKR.   Pt notes decreased hearing in L ear, typically hearing in R ear is worse.  BP stable on current meds.  Denies falls, but notes recent balance issues after going from sitting to standing.  At times has to hold on to things.  Denies feeling dizzy or like the room is spinning.  May drink 32 oz of water per day.  Notes occasional heart burn relieved by Tums.  Pt mentions removing a tick from her RUE last wk.  States area on her arm has not gone away yet.  Denies rash, fever, chills, joint pain.  Found another tick that was crawling on her a few days later.  Pt had mammogram done this yr.  Colonoscopy done July 2017.    Preventive Screening-Counseling & Management  Tobacco Social History   Tobacco Use  Smoking Status Former Smoker  . Types: Cigarettes  . Quit date: 05/03/1980  . Years since quitting: 39.3  Smokeless Tobacco Never Used  Tobacco Comment   a .50 a pack;       Current Problems (verified) Patient Active Problem List   Diagnosis Date Noted  . OA (osteoarthritis) of knee 07/18/2017  . Allergic contact dermatitis due to metals 06/30/2017  . Acute colitis 09/29/2015  . Hyponatremia 09/29/2015  . Family history of cancer of GI tract 08/29/2013  . HOCM (hypertrophic obstructive cardiomyopathy) ([place]) 06/11/2010  . OTHER SPECIFIED DISORDERS OF LIVER 05/07/2010  . UNSPECIFIED VITAMIN D DEFICIENCY 03/17/2010  . Hyperlipemia 03/17/2010  . DEPRESSION 03/17/2010  . GLUCOMA 03/17/2010  . DIPLOPIA 03/17/2010  . Essential hypertension 03/17/2010  . MITRAL VALVE PROLAPSE 03/17/2010  . UNSPEC HEMORRHOIDS WITHOUT MENTION COMPLICATION 123XX123  . BLOOD IN STOOL 03/17/2010  . JOINT PAIN, HAND 03/17/2010  . SCIATICA 03/17/2010  . BACK PAIN  03/17/2010  . CHEST PAIN 03/17/2010    Medications Prior to Visit Current Outpatient Medications on File Prior to Visit  Medication Sig Dispense Refill  . amLODipine (NORVASC) 10 MG tablet Take 1 tablet (10 mg total) by mouth daily. 90 tablet 3  . aspirin 81 MG tablet Take 81 mg by mouth daily.    Marland Kitchen azithromycin (ZITHROMAX) 250 MG tablet Take 2 pills on day one.  Then take 1 pill daily on days 2-5. 6 tablet 0  . hydrochlorothiazide (HYDRODIURIL) 25 MG tablet Take 1 tablet (25 mg total) by mouth daily. 90 tablet 3  . ibuprofen (ADVIL,MOTRIN) 200 MG tablet Take 200 mg by mouth as needed.    . Loratadine 10 MG CAPS Take by mouth daily.    . meclizine (ANTIVERT) 25 MG tablet Take 1 tablet (25 mg total) by mouth 3 (three) times daily as needed for dizziness. 30 tablet 0  . MELATONIN PO Take 3 mg by mouth daily.    . Misc Natural Products (GLUCOSAMINE CHONDROITIN ADV PO) Take by mouth 2 (two) times daily.    . Multiple Vitamins-Minerals (MULTIVITAMIN ADULT PO) Take by mouth daily.    Mckinley Jewel Dimesylate (RHOPRESSA OP) Apply to eye.    . Omega-3 Fatty Acids (FISH OIL PO) Take by mouth daily.    . ondansetron (ZOFRAN) 4 MG tablet Take 1 tablet (4 mg total) by mouth every 8 (eight) hours  as needed for nausea or vomiting. 20 tablet 0  . OVER THE COUNTER MEDICATION Calcium carbonate, vit D, min    . Probiotic Product (PROBIOTIC PO) Take by mouth at bedtime.    . pyridOXINE (VITAMIN B-6) 50 MG tablet Take 50 mg by mouth daily.    . rosuvastatin (CRESTOR) 10 MG tablet Take 1 tablet (10 mg total) by mouth daily. 90 tablet 3  . timolol (TIMOPTIC) 0.5 % ophthalmic solution Place 1 drop into both eyes 2 (two) times daily.     Marland Kitchen venlafaxine XR (EFFEXOR-XR) 75 MG 24 hr capsule TAKE 1 CAPSULE BY MOUTH EVERY DAY 90 capsule 1   No current facility-administered medications on file prior to visit.    Current Medications (verified) Current Outpatient Medications  Medication Sig Dispense Refill  .  amLODipine (NORVASC) 10 MG tablet Take 1 tablet (10 mg total) by mouth daily. 90 tablet 3  . aspirin 81 MG tablet Take 81 mg by mouth daily.    Marland Kitchen azithromycin (ZITHROMAX) 250 MG tablet Take 2 pills on day one.  Then take 1 pill daily on days 2-5. 6 tablet 0  . hydrochlorothiazide (HYDRODIURIL) 25 MG tablet Take 1 tablet (25 mg total) by mouth daily. 90 tablet 3  . ibuprofen (ADVIL,MOTRIN) 200 MG tablet Take 200 mg by mouth as needed.    . Loratadine 10 MG CAPS Take by mouth daily.    . meclizine (ANTIVERT) 25 MG tablet Take 1 tablet (25 mg total) by mouth 3 (three) times daily as needed for dizziness. 30 tablet 0  . MELATONIN PO Take 3 mg by mouth daily.    . Misc Natural Products (GLUCOSAMINE CHONDROITIN ADV PO) Take by mouth 2 (two) times daily.    . Multiple Vitamins-Minerals (MULTIVITAMIN ADULT PO) Take by mouth daily.    Mckinley Jewel Dimesylate (RHOPRESSA OP) Apply to eye.    . Omega-3 Fatty Acids (FISH OIL PO) Take by mouth daily.    . ondansetron (ZOFRAN) 4 MG tablet Take 1 tablet (4 mg total) by mouth every 8 (eight) hours as needed for nausea or vomiting. 20 tablet 0  . OVER THE COUNTER MEDICATION Calcium carbonate, vit D, min    . Probiotic Product (PROBIOTIC PO) Take by mouth at bedtime.    . pyridOXINE (VITAMIN B-6) 50 MG tablet Take 50 mg by mouth daily.    . rosuvastatin (CRESTOR) 10 MG tablet Take 1 tablet (10 mg total) by mouth daily. 90 tablet 3  . timolol (TIMOPTIC) 0.5 % ophthalmic solution Place 1 drop into both eyes 2 (two) times daily.     Marland Kitchen venlafaxine XR (EFFEXOR-XR) 75 MG 24 hr capsule TAKE 1 CAPSULE BY MOUTH EVERY DAY 90 capsule 1  . doxycycline (VIBRA-TABS) 100 MG tablet Take 1 tablet (100 mg total) by mouth 2 (two) times daily for 7 days. 14 tablet 0   No current facility-administered medications for this visit.     Allergies (verified) Amoxicillin-pot clavulanate, Augmentin [amoxicillin-pot clavulanate], and Nickel   PAST HISTORY  Family History Family  History  Problem Relation Age of Onset  . Dementia Mother   . Cancer Father   . Heart failure Father   . Allergic rhinitis Neg Hx   . Asthma Neg Hx   . Eczema Neg Hx   . Urticaria Neg Hx   . Angioedema Neg Hx     Social History Social History   Tobacco Use  . Smoking status: Former Smoker    Types: Cigarettes  Quit date: 05/03/1980    Years since quitting: 39.3  . Smokeless tobacco: Never Used  . Tobacco comment: a .50 a pack;   Substance Use Topics  . Alcohol use: No     Cardiac risk factors: advanced age (older than 29 for men, 27 for women), dyslipidemia, hypertension, obesity (BMI >= 30 kg/m2) and former tobacco.  Depression Screen (Note: if answer to either of the following is "Yes", a more complete depression screening is indicated)   Over the past two weeks, have you felt down, depressed or hopeless? No  Over the past two weeks, have you felt little interest or pleasure in doing things? No  Have you lost interest or pleasure in daily life? No  Do you often feel hopeless? No  Do you cry easily over simple problems? No  Activities of Daily Living In your present state of health, do you have any difficulty performing the following activities?:  Driving? No Managing money?  No Feeding yourself? No Getting from bed to chair? No Climbing a flight of stairs? No Preparing food and eating?: No Bathing or showering? No Getting dressed: No Getting to the toilet? No Using the toilet:No Moving around from place to place: No In the past year have you fallen or had a near fall?:No  Hearing Difficulties: Yes   Do you feel that you have a problem with memory? No  Do you often misplace items? No  Do you feel safe at home?  Yes  Cognitive Testing  Alert? Yes  Normal Appearance?Yes  Oriented to person? Yes  Place? Yes   Time? Yes  Displays appropriate judgment?Yes    Advanced Directives have been discussed with the patient? Yes  List the Names of Other  Physician/Practitioners you currently use: 1.   Dr. Mare Ferrari 2.   Dr. Ellie Lunch-  Ophthalmology  Indicate any recent Medical Services you may have received from other than [facility] providers in the past year (date may be approximate).  Immunization History  Administered Date(s) Administered  . Hepatitis A 06/03/2010, 04/14/2011  . Hepatitis B 06/03/2010, 04/14/2011, 09/15/2011  . Influenza Split 03/18/2015  . Influenza Whole 02/20/2013  . Influenza-Unspecified 02/14/2014, 01/29/2016, 03/05/2017  . Moderna SARS-COVID-2 Vaccination 06/01/2019, 07/02/2019  . Pneumococcal Conjugate-13 03/06/2014  . Pneumococcal Polysaccharide-23 04/01/2015  . Tdap 06/03/2010, 07/04/2017  . Zoster 01/01/2009    Screening Tests Health Maintenance  Topic Date Due  . Hepatitis C Screening  Never done  . URINE MICROALBUMIN  Never done  . MAMMOGRAM  06/28/2019  . INFLUENZA VACCINE  12/02/2019  . COLONOSCOPY  11/23/2025  . TETANUS/TDAP  07/05/2027  . DEXA SCAN  Completed  . COVID-19 Vaccine  Completed  . PNA vac Low Risk Adult  Completed    All answers were reviewed with the patient and necessary referrals were made:  Billie Ruddy, MD   08/28/2019   History reviewed: allergies, current medications, past family history, past medical history, past social history, past surgical history and problem list  Review of Systems Pertinent items noted in HPI and remainder of comprehensive ROS otherwise negative.    Objective:   Body mass index is 37.77 kg/m. BP 132/60 (BP Location: Left Arm, Patient Position: Sitting, Cuff Size: Large)   Pulse (!) 50   Temp 97.9 F (36.6 C) (Temporal)   Wt 234 lb (106.1 kg)   SpO2 97%   BMI 37.77 kg/m   BP 132/60 (BP Location: Left Arm, Patient Position: Sitting, Cuff Size: Large)   Pulse (!) 50  Temp 97.9 F (36.6 C) (Temporal)   Wt 234 lb (106.1 kg)   SpO2 97%   BMI 37.77 kg/m   General Appearance:    Alert, cooperative, no distress, appears stated age   Head:    Normocephalic, without obvious abnormality, atraumatic  Eyes:    PERRL, conjunctiva/corneas clear, EOM's intact  Ears:    Normal L TM and external ear canal. No cerumen impaction noted.  R TM full with mild erythema along lower edge and air fluid level, non suppurative. Decreased hearing b/l.  R ear only able to hear 4000 Hz,  L ear 2000 and 3000 Hz heard.  Nose:   Nares normal, septum midline, mucosa normal, no drainage    or sinus tenderness  Throat:   Lips, mucosa, and tongue normal; teeth and gums normal  Neck:   Supple, symmetrical, trachea midline, no adenopathy;    thyroid:  no enlargement/tenderness/nodules; b/l carotid   bruits L>R.  No JVD  Lungs:     Clear to auscultation bilaterally, respirations unlabored   Heart:    Regular rate and rhythm, S1 and S2 normal, no murmur, rub   or gallop  Abdomen:     Soft, non-tender, bowel sounds active all four quadrants,    no masses, no organomegaly  Extremities:   Extremities normal, atraumatic, no cyanosis or edema  Pulses:   2+ and symmetric all extremities  Skin:   Skin color, texture, turgor normal, no rashes.  R upper arm at deltoid with healing 3 mm area with eschar, no surrounding erythema or target lesion.  Lymph nodes:   Cervical, supraclavicular, and axillary nodes normal  Neurologic:   CNII-XII intact, normal strength, sensation and reflexes    throughout       Assessment:   Pt is a 74 yo female seen for AWV and f/u on chronic conditions with decreased hearing, recent tick bite, and balance issues.     Plan:     During the course of the visit the patient was educated and counseled about appropriate screening and preventive services including:    Screening mammography  Bone densitometry screening- ordered last yr (July 2020) per chart review, however not scheduled.  Colorectal cancer screening- done July 2017.  No longer indicated  Nutrition counseling   Diet review for nutrition referral? Yes ____  Not  Indicated __X__  Tick bite, initial encounter  - Plan: doxycycline (VIBRA-TABS) 100 MG tablet  Gastroesophageal reflux disease, unspecified whether esophagitis present -Discussed foods known to cause symptoms. -Continue Tums as needed -For continued or worsening symptoms pt to notify clinic -Discussed PPIs  Other acute nonsuppurative otitis media of right ear, recurrence not specified -Plan: doxycycline  Prediabetes -Hgb A1C was 6.0% on 03/08/18 -Discussed lifestyle modifications - Plan: Hemoglobin A1c  Mixed hyperlipidemia  - Plan: Lipid panel  Hearing decreased, bilateral  - Plan: Ambulatory referral to Audiology  HTN -stable -continue current meds -CMP  Balance problem -discussed various causes -will increase po intake of water and fluids -for continued symptoms after 1-2 wks consider imaging- CT head  Bilateral carotid bruits  - Plan: US Carotid Duplex Bilateral  Postmenopausal  - Plan: DG Bone Density    Patient Instructions (the written plan) was given to the patient.  Medicare Attestation I have personally reviewed: The patient's medical and social history Their use of alcohol, tobacco or illicit drugs Their current medications and supplements The patient's functional ability including ADLs,fall risks, home safety risks, cognitive, and hearing and visual impairment Diet  and physical activities Evidence for depression or mood disorders  The patient's weight, height, BMI, and visual acuity have been recorded in the chart.  I have made referrals, counseling, and provided education to the patient based on review of the above and I have provided the patient with a written personalized care plan for preventive services.     Billie Ruddy, MD   08/28/2019

## 2019-08-30 ENCOUNTER — Encounter: Payer: Self-pay | Admitting: Family Medicine

## 2019-08-30 DIAGNOSIS — M542 Cervicalgia: Secondary | ICD-10-CM | POA: Diagnosis not present

## 2019-08-30 DIAGNOSIS — M9901 Segmental and somatic dysfunction of cervical region: Secondary | ICD-10-CM | POA: Diagnosis not present

## 2019-08-30 DIAGNOSIS — M9903 Segmental and somatic dysfunction of lumbar region: Secondary | ICD-10-CM | POA: Diagnosis not present

## 2019-08-30 DIAGNOSIS — M1712 Unilateral primary osteoarthritis, left knee: Secondary | ICD-10-CM | POA: Diagnosis not present

## 2019-08-30 DIAGNOSIS — M545 Low back pain: Secondary | ICD-10-CM | POA: Diagnosis not present

## 2019-08-31 ENCOUNTER — Telehealth: Payer: Self-pay | Admitting: Family Medicine

## 2019-08-31 NOTE — Telephone Encounter (Signed)
Pt was returning Nancy's call about lab results. Pt stated she has an appt at 5pm but if she is unable to get ahold of her she can leave a detailed message on her cell phone [phone]

## 2019-09-03 ENCOUNTER — Other Ambulatory Visit: Payer: Self-pay

## 2019-09-03 DIAGNOSIS — R0989 Other specified symptoms and signs involving the circulatory and respiratory systems: Secondary | ICD-10-CM

## 2019-09-03 NOTE — Telephone Encounter (Signed)
Spoke with pt verbalized understanding of her lab results and recommendations 

## 2019-09-04 ENCOUNTER — Encounter (HOSPITAL_COMMUNITY): Payer: PPO

## 2019-09-06 DIAGNOSIS — M1712 Unilateral primary osteoarthritis, left knee: Secondary | ICD-10-CM | POA: Diagnosis not present

## 2019-09-11 ENCOUNTER — Other Ambulatory Visit: Payer: Self-pay | Admitting: Family Medicine

## 2019-09-13 ENCOUNTER — Ambulatory Visit (HOSPITAL_COMMUNITY)
Admission: RE | Admit: 2019-09-13 | Discharge: 2019-09-13 | Disposition: A | Discharge status: Home / Self Care | Payer: PPO | Source: Ambulatory Visit | Attending: Cardiology | Admitting: Cardiology

## 2019-09-13 ENCOUNTER — Other Ambulatory Visit: Payer: Self-pay

## 2019-09-13 DIAGNOSIS — R0989 Other specified symptoms and signs involving the circulatory and respiratory systems: Secondary | ICD-10-CM

## 2019-09-13 DIAGNOSIS — M1712 Unilateral primary osteoarthritis, left knee: Secondary | ICD-10-CM | POA: Diagnosis not present

## 2019-09-25 DIAGNOSIS — M542 Cervicalgia: Secondary | ICD-10-CM | POA: Diagnosis not present

## 2019-09-25 DIAGNOSIS — M9901 Segmental and somatic dysfunction of cervical region: Secondary | ICD-10-CM | POA: Diagnosis not present

## 2019-09-25 DIAGNOSIS — M545 Low back pain: Secondary | ICD-10-CM | POA: Diagnosis not present

## 2019-09-25 DIAGNOSIS — M9903 Segmental and somatic dysfunction of lumbar region: Secondary | ICD-10-CM | POA: Diagnosis not present

## 2019-10-25 DIAGNOSIS — M545 Low back pain: Secondary | ICD-10-CM | POA: Diagnosis not present

## 2019-10-25 DIAGNOSIS — M542 Cervicalgia: Secondary | ICD-10-CM | POA: Diagnosis not present

## 2019-10-25 DIAGNOSIS — M9903 Segmental and somatic dysfunction of lumbar region: Secondary | ICD-10-CM | POA: Diagnosis not present

## 2019-10-25 DIAGNOSIS — M9901 Segmental and somatic dysfunction of cervical region: Secondary | ICD-10-CM | POA: Diagnosis not present

## 2019-11-02 ENCOUNTER — Other Ambulatory Visit: Payer: Self-pay | Admitting: Family Medicine

## 2019-11-02 DIAGNOSIS — E2839 Other primary ovarian failure: Secondary | ICD-10-CM

## 2019-11-08 ENCOUNTER — Ambulatory Visit
Admission: RE | Admit: 2019-11-08 | Discharge: 2019-11-08 | Disposition: A | Discharge status: Home / Self Care | Payer: PPO | Source: Ambulatory Visit | Attending: Family Medicine | Admitting: Family Medicine

## 2019-11-08 ENCOUNTER — Other Ambulatory Visit: Payer: Self-pay

## 2019-11-08 DIAGNOSIS — Z780 Asymptomatic menopausal state: Secondary | ICD-10-CM | POA: Diagnosis not present

## 2019-11-08 DIAGNOSIS — E2839 Other primary ovarian failure: Secondary | ICD-10-CM

## 2019-11-14 DIAGNOSIS — L853 Xerosis cutis: Secondary | ICD-10-CM | POA: Diagnosis not present

## 2019-11-14 DIAGNOSIS — D1801 Hemangioma of skin and subcutaneous tissue: Secondary | ICD-10-CM | POA: Diagnosis not present

## 2019-11-14 DIAGNOSIS — L814 Other melanin hyperpigmentation: Secondary | ICD-10-CM | POA: Diagnosis not present

## 2019-11-14 DIAGNOSIS — D2272 Melanocytic nevi of left lower limb, including hip: Secondary | ICD-10-CM | POA: Diagnosis not present

## 2019-11-14 DIAGNOSIS — L821 Other seborrheic keratosis: Secondary | ICD-10-CM | POA: Diagnosis not present

## 2019-11-14 DIAGNOSIS — L820 Inflamed seborrheic keratosis: Secondary | ICD-10-CM | POA: Diagnosis not present

## 2019-11-14 DIAGNOSIS — L438 Other lichen planus: Secondary | ICD-10-CM | POA: Diagnosis not present

## 2019-11-14 DIAGNOSIS — L720 Epidermal cyst: Secondary | ICD-10-CM | POA: Diagnosis not present

## 2019-11-14 DIAGNOSIS — D220 Melanocytic nevi of lip: Secondary | ICD-10-CM | POA: Diagnosis not present

## 2019-11-22 DIAGNOSIS — M545 Low back pain: Secondary | ICD-10-CM | POA: Diagnosis not present

## 2019-11-22 DIAGNOSIS — M9901 Segmental and somatic dysfunction of cervical region: Secondary | ICD-10-CM | POA: Diagnosis not present

## 2019-11-22 DIAGNOSIS — M9903 Segmental and somatic dysfunction of lumbar region: Secondary | ICD-10-CM | POA: Diagnosis not present

## 2019-11-22 DIAGNOSIS — M542 Cervicalgia: Secondary | ICD-10-CM | POA: Diagnosis not present

## 2019-12-14 ENCOUNTER — Ambulatory Visit (INDEPENDENT_AMBULATORY_CARE_PROVIDER_SITE_OTHER): Payer: PPO | Admitting: Family Medicine

## 2019-12-14 ENCOUNTER — Other Ambulatory Visit: Payer: Self-pay

## 2019-12-14 ENCOUNTER — Ambulatory Visit
Admission: RE | Admit: 2019-12-14 | Discharge: 2019-12-14 | Disposition: A | Discharge status: Home / Self Care | Payer: PPO | Source: Ambulatory Visit | Attending: Family Medicine | Admitting: Family Medicine

## 2019-12-14 ENCOUNTER — Encounter: Payer: Self-pay | Admitting: Family Medicine

## 2019-12-14 VITALS — BP 130/62 | HR 60 | Temp 98.6°F | Wt 234.2 lb

## 2019-12-14 DIAGNOSIS — K5730 Diverticulosis of large intestine without perforation or abscess without bleeding: Secondary | ICD-10-CM | POA: Diagnosis not present

## 2019-12-14 DIAGNOSIS — R1032 Left lower quadrant pain: Secondary | ICD-10-CM

## 2019-12-14 LAB — CBC WITH DIFFERENTIAL/PLATELET
Absolute Monocytes: 828 cells/uL (ref 200–950)
Basophils Absolute: 50 cells/uL (ref 0–200)
Basophils Relative: 0.7 %
Eosinophils Absolute: 144 cells/uL (ref 15–500)
Eosinophils Relative: 2 %
HCT: 40.1 % (ref 35.0–45.0)
Hemoglobin: 13.6 g/dL (ref 11.7–15.5)
Lymphs Abs: 1663 cells/uL (ref 850–3900)
MCH: 33.8 pg — ABNORMAL HIGH (ref 27.0–33.0)
MCHC: 33.9 g/dL (ref 32.0–36.0)
MCV: 99.8 fL (ref 80.0–100.0)
MPV: 10.9 fL (ref 7.5–12.5)
Monocytes Relative: 11.5 %
Neutro Abs: 4514 cells/uL (ref 1500–7800)
Neutrophils Relative %: 62.7 %
Platelets: 276 10*3/uL (ref 140–400)
RBC: 4.02 10*6/uL (ref 3.80–5.10)
RDW: 12.4 % (ref 11.0–15.0)
Total Lymphocyte: 23.1 %
WBC: 7.2 10*3/uL (ref 3.8–10.8)

## 2019-12-14 LAB — BASIC METABOLIC PANEL
BUN: 16 mg/dL (ref 7–25)
CO2: 31 mmol/L (ref 20–32)
Calcium: 10.1 mg/dL (ref 8.6–10.4)
Chloride: 98 mmol/L (ref 98–110)
Creat: 0.79 mg/dL (ref 0.60–0.93)
Glucose, Bld: 109 mg/dL — ABNORMAL HIGH (ref 65–99)
Potassium: 4 mmol/L (ref 3.5–5.3)
Sodium: 136 mmol/L (ref 135–146)

## 2019-12-14 MED ORDER — IOPAMIDOL (ISOVUE-300) INJECTION 61%
100.0000 mL | Freq: Once | INTRAVENOUS | Status: AC | PRN
  Administered 2019-12-14: 100 mL via INTRAVENOUS

## 2019-12-14 MED ORDER — METRONIDAZOLE 500 MG PO TABS
500.0000 mg | ORAL_TABLET | Freq: Three times a day (TID) | ORAL | 0 refills | Status: DC
Start: 2019-12-14 — End: 2020-03-13

## 2019-12-14 MED ORDER — CIPROFLOXACIN HCL 500 MG PO TABS
500.0000 mg | ORAL_TABLET | Freq: Two times a day (BID) | ORAL | 0 refills | Status: DC
Start: 2019-12-14 — End: 2020-03-13

## 2019-12-14 NOTE — Progress Notes (Signed)
   Subjective:    Patient ID: Susan Jenkins, female    DOB: [birth year], 74 y.o.   MRN: [id number]  HPI Here for 6 weeks of intermittent dark blood in the stool and mild LLQ abdominal pains. No nausea or vomiting. No fever. Appetite is normal. She averages a BM every other day. She takes Metamucil daily and she takes Electronics engineer (probiotic) daily. She had an episode of LLQ pain (much more severe than this) and bright red blood per rectum (not with stools) in 2017. A CT revealed left sided colitis, presumed ischemic. She then had a colonoscopy per Dr. Earlean Shawl on 11-24-15 showing some residual signs of colitis and extensive left sided diverticulosis.    Review of Systems  Constitutional: Negative.   Respiratory: Negative.   Cardiovascular: Negative.   Gastrointestinal: Positive for abdominal pain and blood in stool. Negative for abdominal distention, anal bleeding, constipation, diarrhea, nausea, rectal pain and vomiting.  Genitourinary: Negative.        Objective:   Physical Exam Constitutional:      General: She is not in acute distress.    Appearance: Normal appearance.  Cardiovascular:     Rate and Rhythm: Normal rate and regular rhythm.     Pulses: Normal pulses.     Heart sounds: Normal heart sounds.  Pulmonary:     Effort: Pulmonary effort is normal.     Breath sounds: Normal breath sounds.  Abdominal:     General: Abdomen is flat. Bowel sounds are normal. There is no distension.     Palpations: Abdomen is soft. There is no mass.     Tenderness: There is no guarding or rebound.     Hernia: No hernia is present.     Comments: Mildly tender in the LLQ   Neurological:     Mental Status: She is alert.           Assessment & Plan:  This is likely due to diverticulitis. Treat with Cipro and Flagyl. Get a CBC and BMET now, and set up a CT of the abdomen and pelvis asap.  Alysia Penna, MD

## 2019-12-18 DIAGNOSIS — H401132 Primary open-angle glaucoma, bilateral, moderate stage: Secondary | ICD-10-CM | POA: Diagnosis not present

## 2019-12-19 IMAGING — CR DG WRIST COMPLETE 3+V*R*
4 series · 4 of 4 positions shown · non-contrast
Comparison: None.

CLINICAL DATA: Per EMS-tripped and fell forward-hematoma above left
eye, abrasion to left side of face-no blood thinners, complaining of
a headache, left knee and right wrist.

EXAM:
RIGHT WRIST - COMPLETE 3+ VIEW

[x wrist pa right]
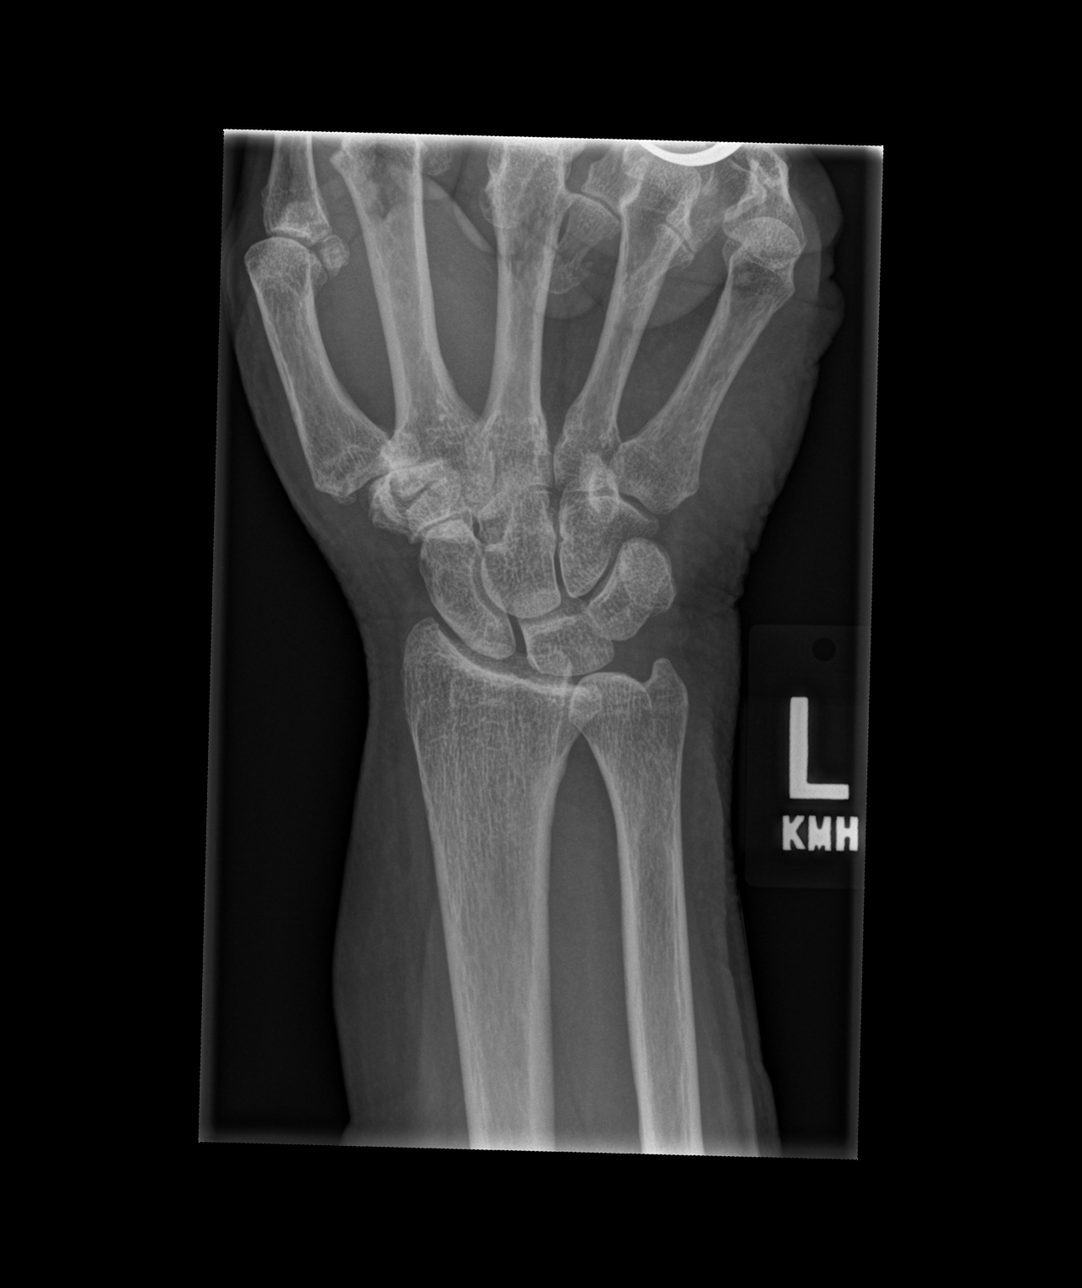

[x wrist obl right]
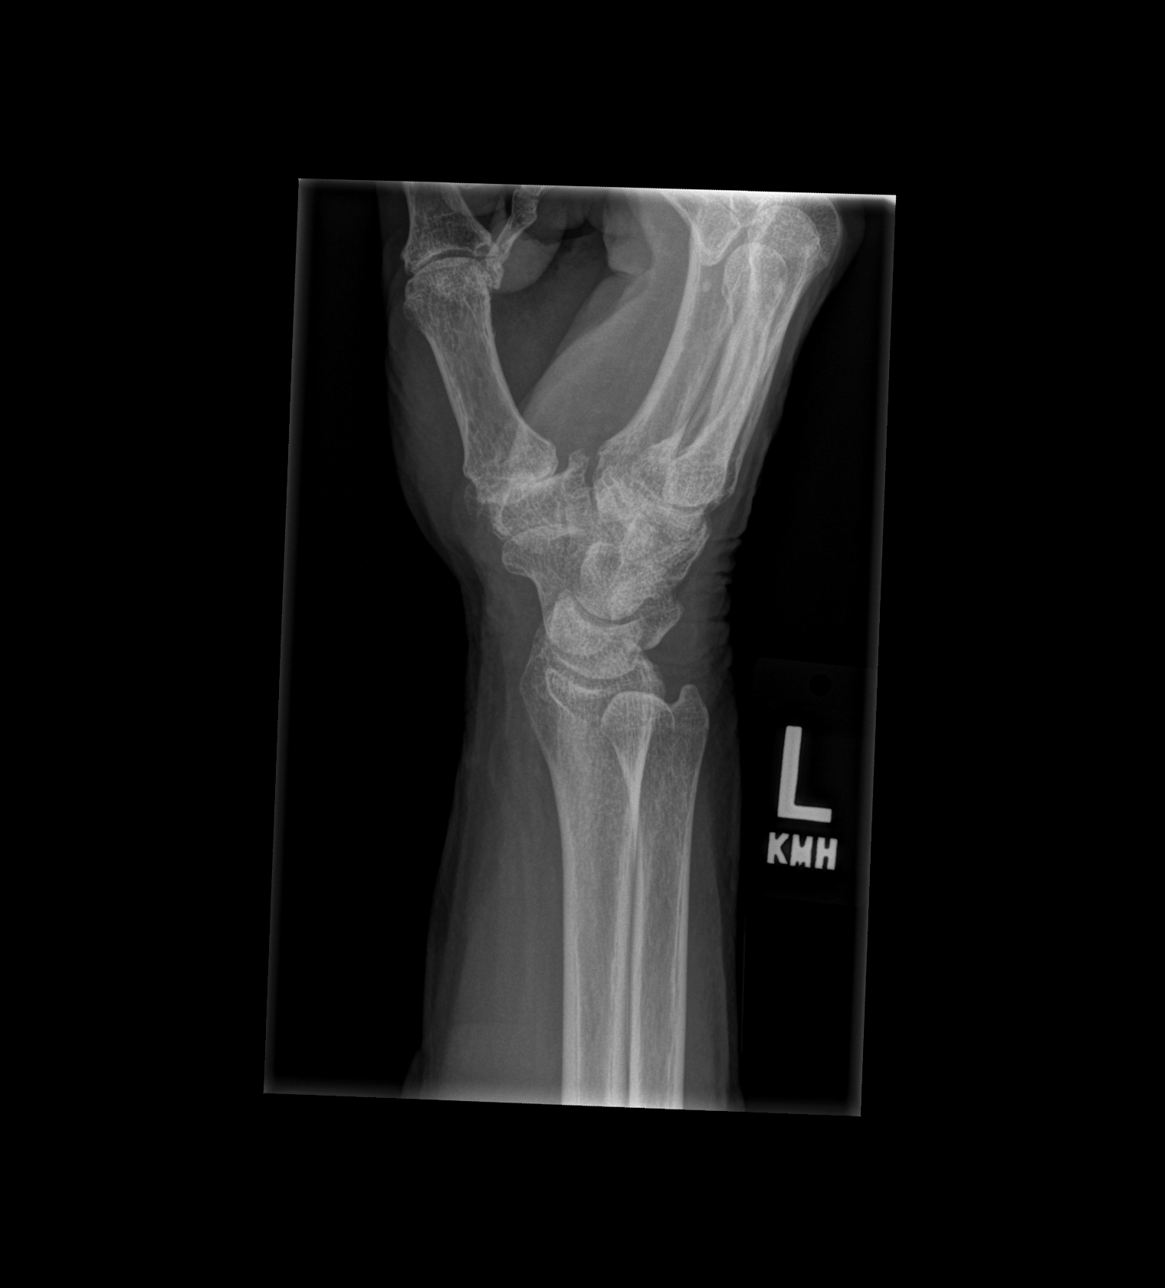

[x wrist lat right]
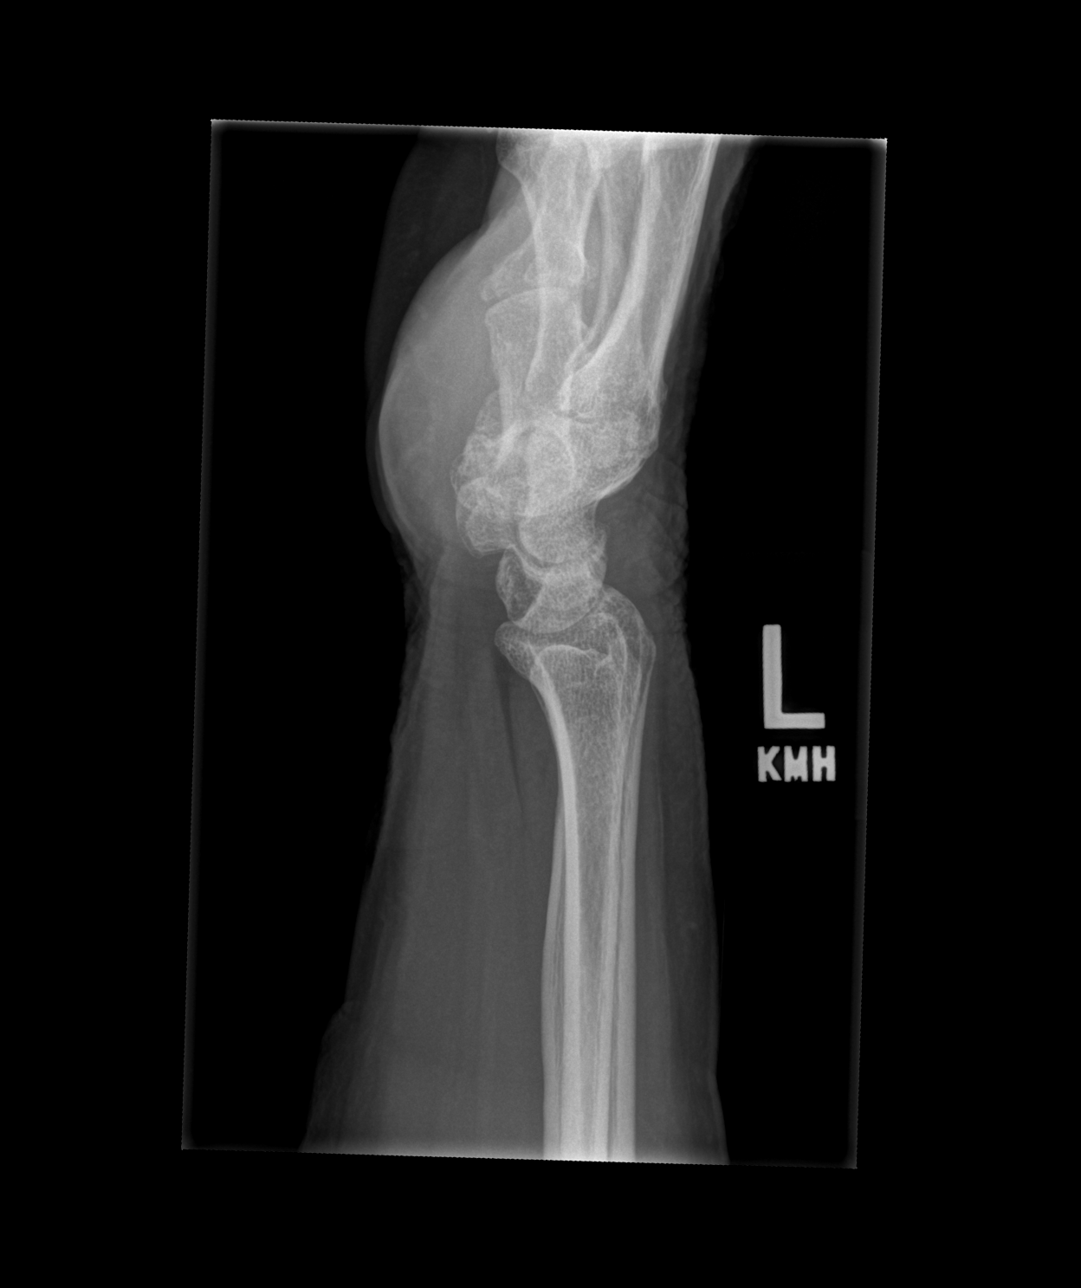

[x wrist navicular view right]
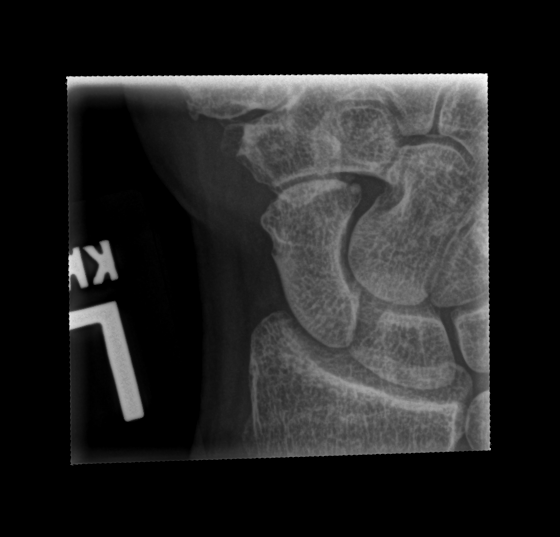

[4 of 4 positions shown; findings below may reference images not displayed]

FINDINGS: No fracture.  No bone lesion.

The joints are normally aligned. There is joint space narrowing
involving the scaphoid trapezium trapezoid articulation and the
trapezium first metacarpal articulation consistent with
osteoarthritis. Remaining joints are normally spaced and aligned.

Soft tissues are unremarkable.
IMPRESSION: No fracture or dislocation.

## 2019-12-20 DIAGNOSIS — M9903 Segmental and somatic dysfunction of lumbar region: Secondary | ICD-10-CM | POA: Diagnosis not present

## 2019-12-20 DIAGNOSIS — M545 Low back pain: Secondary | ICD-10-CM | POA: Diagnosis not present

## 2019-12-20 DIAGNOSIS — M9901 Segmental and somatic dysfunction of cervical region: Secondary | ICD-10-CM | POA: Diagnosis not present

## 2019-12-20 DIAGNOSIS — M542 Cervicalgia: Secondary | ICD-10-CM | POA: Diagnosis not present

## 2020-01-17 DIAGNOSIS — M542 Cervicalgia: Secondary | ICD-10-CM | POA: Diagnosis not present

## 2020-01-17 DIAGNOSIS — M9903 Segmental and somatic dysfunction of lumbar region: Secondary | ICD-10-CM | POA: Diagnosis not present

## 2020-01-17 DIAGNOSIS — M545 Low back pain: Secondary | ICD-10-CM | POA: Diagnosis not present

## 2020-01-17 DIAGNOSIS — M9901 Segmental and somatic dysfunction of cervical region: Secondary | ICD-10-CM | POA: Diagnosis not present

## 2020-02-14 DIAGNOSIS — M62830 Muscle spasm of back: Secondary | ICD-10-CM | POA: Diagnosis not present

## 2020-02-14 DIAGNOSIS — M542 Cervicalgia: Secondary | ICD-10-CM | POA: Diagnosis not present

## 2020-02-14 DIAGNOSIS — M9901 Segmental and somatic dysfunction of cervical region: Secondary | ICD-10-CM | POA: Diagnosis not present

## 2020-02-14 DIAGNOSIS — M9903 Segmental and somatic dysfunction of lumbar region: Secondary | ICD-10-CM | POA: Diagnosis not present

## 2020-02-29 ENCOUNTER — Other Ambulatory Visit: Payer: Self-pay | Admitting: Family Medicine

## 2020-02-29 NOTE — Telephone Encounter (Signed)
Patient last seen in April 2021 by this provider.  Patient should schedule a follow-up appointment for anxiety, depression.

## 2020-02-29 NOTE — Telephone Encounter (Signed)
Pt LOV was on 12/14/2019 and last refill was done on 09/11/2019 for 90 tablets with 1 refill, please advise if ok to refill or schedule pt for a visit

## 2020-03-10 ENCOUNTER — Other Ambulatory Visit: Payer: Self-pay | Admitting: Family Medicine

## 2020-03-12 ENCOUNTER — Other Ambulatory Visit: Payer: Self-pay

## 2020-03-13 ENCOUNTER — Other Ambulatory Visit: Payer: Self-pay

## 2020-03-13 ENCOUNTER — Ambulatory Visit (INDEPENDENT_AMBULATORY_CARE_PROVIDER_SITE_OTHER): Payer: PPO | Admitting: Family Medicine

## 2020-03-13 DIAGNOSIS — K649 Unspecified hemorrhoids: Secondary | ICD-10-CM | POA: Diagnosis not present

## 2020-03-13 DIAGNOSIS — F339 Major depressive disorder, recurrent, unspecified: Secondary | ICD-10-CM | POA: Diagnosis not present

## 2020-03-13 DIAGNOSIS — K625 Hemorrhage of anus and rectum: Secondary | ICD-10-CM | POA: Diagnosis not present

## 2020-03-13 DIAGNOSIS — M542 Cervicalgia: Secondary | ICD-10-CM | POA: Diagnosis not present

## 2020-03-13 DIAGNOSIS — M9903 Segmental and somatic dysfunction of lumbar region: Secondary | ICD-10-CM | POA: Diagnosis not present

## 2020-03-13 DIAGNOSIS — M62830 Muscle spasm of back: Secondary | ICD-10-CM | POA: Diagnosis not present

## 2020-03-13 DIAGNOSIS — M9901 Segmental and somatic dysfunction of cervical region: Secondary | ICD-10-CM | POA: Diagnosis not present

## 2020-03-13 LAB — CBC WITH DIFFERENTIAL/PLATELET
Absolute Monocytes: 877 cells/uL (ref 200–950)
Basophils Absolute: 48 cells/uL (ref 0–200)
Basophils Relative: 0.7 %
Eosinophils Absolute: 150 cells/uL (ref 15–500)
Eosinophils Relative: 2.2 %
HCT: 38.5 % (ref 35.0–45.0)
Hemoglobin: 13.6 g/dL (ref 11.7–15.5)
Lymphs Abs: 1911 cells/uL (ref 850–3900)
MCH: 34.5 pg — ABNORMAL HIGH (ref 27.0–33.0)
MCHC: 35.3 g/dL (ref 32.0–36.0)
MCV: 97.7 fL (ref 80.0–100.0)
MPV: 11.3 fL (ref 7.5–12.5)
Monocytes Relative: 12.9 %
Neutro Abs: 3815 cells/uL (ref 1500–7800)
Neutrophils Relative %: 56.1 %
Platelets: 253 10*3/uL (ref 140–400)
RBC: 3.94 10*6/uL (ref 3.80–5.10)
RDW: 12.2 % (ref 11.0–15.0)
Total Lymphocyte: 28.1 %
WBC: 6.8 10*3/uL (ref 3.8–10.8)

## 2020-03-13 MED ORDER — VENLAFAXINE HCL ER 75 MG PO CP24: ORAL_CAPSULE | ORAL | 3 refills | Status: DC

## 2020-03-13 NOTE — Patient Instructions (Signed)
Hemorrhoids Hemorrhoids are swollen veins in and around the rectum or anus. There are two types of hemorrhoids:  Internal hemorrhoids. These occur in the veins that are just inside the rectum. They may poke through to the outside and become irritated and painful.  External hemorrhoids. These occur in the veins that are outside the anus and can be felt as a painful swelling or hard lump near the anus. Most hemorrhoids do not cause serious problems, and they can be managed with home treatments such as diet and lifestyle changes. If home treatments do not help the symptoms, procedures can be done to shrink or remove the hemorrhoids. What are the causes? This condition is caused by increased pressure in the anal area. This pressure may result from various things, including:  Constipation.  Straining to have a bowel movement.  Diarrhea.  Pregnancy.  Obesity.  Sitting for long periods of time.  Heavy lifting or other activity that causes you to strain.  Anal sex.  Riding a bike for a long period of time. What are the signs or symptoms? Symptoms of this condition include:  Pain.  Anal itching or irritation.  Rectal bleeding.  Leakage of stool (feces).  Anal swelling.  One or more lumps around the anus. How is this diagnosed? This condition can often be diagnosed through a visual exam. Other exams or tests may also be done, such as:  An exam that involves feeling the rectal area with a gloved hand (digital rectal exam).  An exam of the anal canal that is done using a small tube (anoscope).  A blood test, if you have lost a significant amount of blood.  A test to look inside the colon using a flexible tube with a camera on the end (sigmoidoscopy or colonoscopy). How is this treated? This condition can usually be treated at home. However, various procedures may be done if dietary changes, lifestyle changes, and other home treatments do not help your symptoms. These  procedures can help make the hemorrhoids smaller or remove them completely. Some of these procedures involve surgery, and others do not. Common procedures include:  Rubber band ligation. Rubber bands are placed at the base of the hemorrhoids to cut off their blood supply.  Sclerotherapy. Medicine is injected into the hemorrhoids to shrink them.  Infrared coagulation. A type of light energy is used to get rid of the hemorrhoids.  Hemorrhoidectomy surgery. The hemorrhoids are surgically removed, and the veins that supply them are tied off.  Stapled hemorrhoidopexy surgery. The surgeon staples the base of the hemorrhoid to the rectal wall. Follow these instructions at home: Eating and drinking   Eat foods that have a lot of fiber in them, such as whole grains, beans, nuts, fruits, and vegetables.  Ask your health care provider about taking products that have added fiber (fiber supplements).  Reduce the amount of fat in your diet. You can do this by eating low-fat dairy products, eating less red meat, and avoiding processed foods.  Drink enough fluid to keep your urine pale yellow. Managing pain and swelling   Take warm sitz baths for 20 minutes, 3-4 times a day to ease pain and discomfort. You may do this in a bathtub or using a portable sitz bath that fits over the toilet.  If directed, apply ice to the affected area. Using ice packs between sitz baths may be helpful. ? Put ice in a plastic bag. ? Place a towel between your skin and the bag. ? Leave   the ice on for 20 minutes, 2-3 times a day. General instructions  Take over-the-counter and prescription medicines only as told by your health care provider.  Use medicated creams or suppositories as told.  Get regular exercise. Ask your health care provider how much and what kind of exercise is best for you. In general, you should do moderate exercise for at least 30 minutes on most days of the week (150 minutes each week). This can  include activities such as walking, biking, or yoga.  Go to the bathroom when you have the urge to have a bowel movement. Do not wait.  Avoid straining to have bowel movements.  Keep the anal area dry and clean. Use wet toilet paper or moist towelettes after a bowel movement.  Do not sit on the toilet for long periods of time. This increases blood pooling and pain.  Keep all follow-up visits as told by your health care provider. This is important. Contact a health care provider if you have:  Increasing pain and swelling that are not controlled by treatment or medicine.  Difficulty having a bowel movement, or you are unable to have a bowel movement.  Pain or inflammation outside the area of the hemorrhoids. Get help right away if you have:  Uncontrolled bleeding from your rectum. Summary  Hemorrhoids are swollen veins in and around the rectum or anus.  Most hemorrhoids can be managed with home treatments such as diet and lifestyle changes.  Taking warm sitz baths can help ease pain and discomfort.  In severe cases, procedures or surgery can be done to shrink or remove the hemorrhoids. This information is not intended to replace advice given to you by your health care provider. Make sure you discuss any questions you have with your health care provider. Document Revised: 09/15/2018 Document Reviewed: 09/08/2017 Elsevier Patient Education  [place] Gastrointestinal Bleeding  Lower gastrointestinal (GI) bleeding is the result of bleeding from the colon, rectum, or anal area. The colon is the last part of the digestive tract, where stool, also called feces, is formed. If you have lower GI bleeding, you may see blood in or on your stool. It may be bright red. Lower GI bleeding often stops without treatment. Continued or heavy bleeding needs emergency treatment at the hospital. What are the causes? Lower GI bleeding may be caused by:  A condition that causes  pouches to form in the colon over time (diverticulosis).  Swelling and irritation (inflammation) in areas with diverticulosis (diverticulitis).  Inflammation of the colon (inflammatory bowel disease).  Swollen veins in the rectum (hemorrhoids).  Painful tears in the anus (anal fissures), often caused by passing hard stools.  Cancer of the colon or rectum.  Noncancerous growths (polyps) of the colon or rectum.  A bleeding disorder that impairs the formation of blood clots and causes easy bleeding (coagulopathy).  An abnormal weakening of a blood vessel where an artery and a vein come together (arteriovenous malformation). What increases the risk? You are more likely to develop this condition if:  You are older than 72 years of age.  You take aspirin or NSAIDs on a regular basis.  You take anticoagulant or antiplatelet drugs.  You have a history of high-dose X-ray treatment (radiation therapy) of the colon.  You recently had a colon polyp removed. What are the signs or symptoms? Symptoms of this condition include:  Bright red blood or blood clots coming from your rectum.  Bloody stools.  Black or maroon-colored  stools.  Pain or cramping in the abdomen.  Weakness or dizziness.  Racing heartbeat. How is this diagnosed? This condition may be diagnosed based on:  Your symptoms and medical history.  A physical exam. During the exam, your health care provider will check for signs of blood loss, such as low blood pressure and a rapid pulse.  Tests, such as: ? Flexible sigmoidoscopy. In this procedure, a flexible tube with a camera on the end is used to examine your anus and the first part of your colon to look for the source of bleeding. ? Colonoscopy. This is similar to a flexible sigmoidoscopy, but the camera can extend all the way to the uppermost part of your colon. ? Blood tests to measure your red blood cell count and to check for coagulopathy. ? An imaging study of  your colon to look for a bleeding site. In some cases, you may have X-rays taken after a dye or radioactive substance is injected into your bloodstream (angiogram). How is this treated? Treatment for this condition depends on the cause of the bleeding. Heavy or persistent bleeding is treated at the hospital. Treatment may include:  Getting fluids through an IV tube inserted into one of your veins.  Getting blood through an IV tube (blood transfusion).  Stopping bleeding through high-heat coagulation, injections of certain medicines, or applying surgical clips. This can all be done during a colonoscopy.  Having a procedure that involves first doing an angiogram and then blocking blood flow to the bleeding site (embolization).  Stopping some of your regular medicines for a certain amount of time.  Having surgery to remove part of the colon. This may be needed if bleeding is severe and does not respond to other treatment. Follow these instructions at home:  Take over-the-counter and prescription medicines only as told by your health care provider. You may need to avoid aspirin, NSAIDs, or other medicines that increase bleeding.  Eat foods that are high in fiber. This will help keep your stools soft. These foods include whole grains, legumes, fruits, and vegetables. Eating 1-3 prunes each day works well for many people.  Drink enough fluid to keep your urine clear or pale yellow.  Keep all follow-up visits as told by your health care provider. This is important. Contact a health care provider if:  Your symptoms do not improve. Get help right away if:  Your bleeding increases.  You feel light-headed or you faint.  You feel weak.  You have severe cramps in your back or abdomen.  You pass large blood clots in your stool.  Your symptoms get worse. This information is not intended to replace advice given to you by your health care provider. Make sure you discuss any questions you have  with your health care provider. Document Revised: 08/11/2018 Document Reviewed: 09/04/2015 Elsevier Patient Education  [place].  Seasonal Affective Disorder Seasonal affective disorder (SAD) is a type of depression. It is when you feel depressed at specific times of the year. SAD is most common during late fall and winter when the days are shorter and most people spend less time outdoors. This is why SAD is also known as the "winter blues." SAD occurs less commonly in the spring or summer. SAD can be mild to severe, and it can interfere with work, school, relationships, and normal daily activities. What are the causes? The cause of this condition is not known. It may be related to changes in brain chemistry that are caused by  having less exposure to daylight. What increases the risk? You are more likely to develop this condition if:  You are female.  You live far [place] or far [place] of the equator. These areas get less sunlight and have longer winter seasons.  You have a personal history of depression or bipolar disorder.  You have a family history of mental health conditions. What are the signs or symptoms? Symptoms of this condition include:  Depressed mood, which may involve: ? Feeling sad or teary. ? Having crying spells.  Irritability.  Trouble sleeping, or sleeping more than usual.  Loss of interest in activities that you usually enjoy.  Feelings of guilt or worthlessness.  Restlessness or loss of energy.  Difficulty concentrating, remembering, or making decisions.  Significant change in appetite or weight.  Thinking about self-harm or attempting suicide. Symptoms associated with the winter pattern of SAD include:  Overeating or craving sweet foods.  Weight gain.  Avoiding social situations (social withdrawal), or feeling like "hibernating."  Sleeping more than usual. Symptoms associated with the less common summer pattern of SAD include:  Loss of  appetite.  Weight loss.  Trouble sleeping.  Episodes of violent behavior (in severe cases). How is this diagnosed? This condition is usually diagnosed through an assessment with your health care provider. You will be asked about your moods, thoughts, and behaviors. You will also be asked about your medical history, any major life changes, and any medicines and substances that you use. You may have a physical exam and blood tests to rule out other possible causes of your symptoms. You may be referred to a mental health specialist for more evaluation. How is this treated? Treatment for this condition may include:  Light therapy. This therapy involves sitting in front of a light source for 15-30 minutes every day. The light source may be: ? A light box. ? A dawn simulator or sunrise clock. This is a timer-activated light source that copies the sunrise by slowly becoming brighter. This can help to activate your body's internal clock.  Antidepressant medicine.  Cognitive behavioral therapy (CBT). CBT is a form of talk therapy that helps to identify and change negative thoughts that are associated with SAD.  Changes to your dietary, exercise, or sleeping habits. A healthy lifestyle may help to prevent or relieve symptoms. Follow these instructions at home: Medicines  Take over-the-counter and prescription medicines only as told by your healthcare provider.  If you are taking antidepressant medicines, ask your health care provider what side effects you should be aware of.  Talk with your health care provider before you start taking any new prescription or over-the-counter medicines, herbs, or supplements. Lifestyle  Eat a healthy diet that includes fruits and vegetables, whole grains, and lean proteins.  Get plenty of sleep. To improve your sleep, make sure you: ? Keep your bedroom dark and cool. ? Go to sleep and wake up at about the same time every day. ? Do not keep screens (such as  a TV or smartphone) in your bedroom. Limit your screen time starting a few hours before bedtime.  Exercise regularly.  Limit alcohol and caffeine as told by your health care provider. General instructions  Make your home and work environment as sunny or bright as possible. Open window blinds and move furniture closer to windows.  Spend as much time outside as possible.  Use light therapy for 15-30 minutes every day, or as often as directed.  Attend CBT therapy sessions as directed.  Keep all follow-up visits as told by your health care provider and therapist. This is important. Contact a health care provider if:  Your symptoms do not get better or they get worse.  You have trouble taking care of yourself.  You are using drugs or alcohol to cope with your symptoms.  You have side effects from medicines. Get help right away if:  You have thoughts about hurting yourself or others. If you ever feel like you may hurt yourself or others, or have thoughts about taking your own life, get help right away. You can go to your nearest emergency department or call:  Your local emergency services (911 in the [place]  A suicide crisis helpline, such as the [facility] at 8187492358. This is open 24 hours a day. Summary  Seasonal affective disorder (SAD) is a type of depression that is associated with specific times of the year (usually fall and winter).  This condition may be treated with light therapy, talk therapy, and antidepressant medicines.  To help treat your condition, take good care of yourself and make home and work as sunny and bright as possible.  Seek help right away if you have thoughts about hurting yourself or others. This information is not intended to replace advice given to you by your health care provider. Make sure you discuss any questions you have with your health care provider. Document Revised: 04/01/2017 Document Reviewed:  12/30/2016 Elsevier Patient Education  2020 [employer].

## 2020-03-13 NOTE — Progress Notes (Addendum)
Subjective:    Patient ID: Susan Jenkins, female    DOB: 1946-04-02, 74 y.o.   MRN: [id number]  No chief complaint on file.   HPI Patient was seen today for f/u and med refills.  Pt states she is doing well.  Requesting refill on Effexor ER 75 mg.  Denies anxiety, states mood is okay patient notes recent constipation.  Tried Metamucil.  Had a BM every other day.  Noted BRBPR on stool and tissue.  Tried OTC Preparation H for symptoms.  Denies current bleeding,, dizziness, CP palpitations.  Patient has history of internal hemorrhoids.    Patient states she had a COVID booster shot.  Was swimming for exercise. Past Medical History:  Diagnosis Date  . Complication of anesthesia   . Depression   . Glaucoma   . Hemorrhoids   . HOCM (hypertrophic obstructive cardiomyopathy) ([place])   . Hyperlipidemia   . Hypertension   . PONV (postoperative nausea and vomiting)   . Pulmonary embolus ([place]) 1971   following surgery  . Vitamin D deficiency     Allergies  Allergen Reactions  . Amoxicillin-Pot Clavulanate Anaphylaxis    Has patient had a PCN reaction causing immediate rash, facial/tongue/throat swelling, SOB or lightheadedness with hypotension: Yes Has patient had a PCN reaction causing severe rash involving mucus membranes or skin necrosis: Yes Has patient had a PCN reaction that required hospitalization Yes Has patient had a PCN reaction occurring within the last 10 years: No If all of the above answers are "NO", then may proceed with Cephalosporin use.  . Augmentin [Amoxicillin-Pot Clavulanate]   . Nickel Rash    ROS General: Denies fever, chills, night sweats, changes in weight, changes in appetite HEENT: Denies headaches, ear pain, changes in vision, rhinorrhea, sore throat CV: Denies CP, palpitations, SOB, orthopnea Pulm: Denies SOB, cough, wheezing GI: Denies abdominal pain, nausea, vomiting, diarrhea,  +constipation GU: Denies dysuria, hematuria, frequency, vaginal  discharge Msk: Denies muscle cramps, joint pains Neuro: Denies weakness, numbness, tingling Skin: Denies rashes, bruising Psych: Denies anxiety, hallucinations +depression    Objective:    There were no vitals taken for this visit. Weight 235.4 lbs, temperature 98.8 F BP 10/56, pulse 50, PO2 97%  Gen. Pleasant, well-nourished, in no distress, normal affect   HEENT: [place]/AT, face symmetric, conjunctiva clear, no scleral icterus, PERRLA, EOMI, nares patent without drainage Neck: No JVD, no thyromegaly, no carotid bruits Lungs: no accessory muscle use, CTAB, no wheezes or rales Cardiovascular: RRR, no m/r/g, no peripheral edema Musculoskeletal: No deformities, no cyanosis or clubbing, normal tone Neuro:  A&Ox3, CN II-XII intact, normal gait Skin:  Warm, no lesions/ rash   Wt Readings from Last 3 Encounters:  12/14/19 234 lb 3.2 oz (106.2 kg)  08/27/19 234 lb (106.1 kg)  06/26/18 230 lb (104.3 kg)    Lab Results  Component Value Date   WBC 7.2 12/14/2019   HGB 13.6 12/14/2019   HCT 40.1 12/14/2019   PLT 276 12/14/2019   GLUCOSE 109 (H) 12/14/2019   CHOL 202 (H) 08/27/2019   TRIG 155.0 (H) 08/27/2019   HDL 63.70 08/27/2019   LDLDIRECT 133.0 03/08/2018   LDLCALC 107 (H) 08/27/2019   ALT 25 08/27/2019   AST 26 08/27/2019   NA 136 12/14/2019   K 4.0 12/14/2019   CL 98 12/14/2019   CREATININE 0.79 12/14/2019   BUN 16 12/14/2019   CO2 31 12/14/2019   TSH 3.88 05/11/2016   INR 0.94 07/13/2017   HGBA1C 6.1 08/27/2019  Assessment/Plan:  BRBPR (bright red blood per rectum) -Currently stable -Likely 2/2 history of internal hemorrhoid -We will check H&H -Referral placed to GI -Patient given precautions - Plan: Ambulatory referral to Gastroenterology, CBC with Differential/Platelet, CBC with Differential/Platelet  Hemorrhoids, unspecified hemorrhoid type - Plan: Ambulatory referral to Gastroenterology  Depression, recurrent ([place]) -PHQ-9 score 1 -GAD-7 score  0 -continue Effexor XR 75 mg daily.   -discussed self care. - Plan: venlafaxine XR (EFFEXOR-XR) 75 MG 24 hr capsule  F/u prn  Grier Mitts, MD

## 2020-03-20 ENCOUNTER — Encounter: Payer: Self-pay | Admitting: Family Medicine

## 2020-04-09 DIAGNOSIS — M1712 Unilateral primary osteoarthritis, left knee: Secondary | ICD-10-CM | POA: Diagnosis not present

## 2020-04-09 DIAGNOSIS — M19071 Primary osteoarthritis, right ankle and foot: Secondary | ICD-10-CM | POA: Diagnosis not present

## 2020-04-10 DIAGNOSIS — M9903 Segmental and somatic dysfunction of lumbar region: Secondary | ICD-10-CM | POA: Diagnosis not present

## 2020-04-10 DIAGNOSIS — M542 Cervicalgia: Secondary | ICD-10-CM | POA: Diagnosis not present

## 2020-04-10 DIAGNOSIS — M62830 Muscle spasm of back: Secondary | ICD-10-CM | POA: Diagnosis not present

## 2020-04-10 DIAGNOSIS — M9901 Segmental and somatic dysfunction of cervical region: Secondary | ICD-10-CM | POA: Diagnosis not present

## 2020-04-14 DIAGNOSIS — M19071 Primary osteoarthritis, right ankle and foot: Secondary | ICD-10-CM | POA: Diagnosis not present

## 2020-04-14 DIAGNOSIS — M19079 Primary osteoarthritis, unspecified ankle and foot: Secondary | ICD-10-CM | POA: Diagnosis not present

## 2020-04-14 DIAGNOSIS — M13871 Other specified arthritis, right ankle and foot: Secondary | ICD-10-CM | POA: Diagnosis not present

## 2020-04-16 DIAGNOSIS — M1712 Unilateral primary osteoarthritis, left knee: Secondary | ICD-10-CM | POA: Diagnosis not present

## 2020-04-23 DIAGNOSIS — M1712 Unilateral primary osteoarthritis, left knee: Secondary | ICD-10-CM | POA: Diagnosis not present

## 2020-04-29 DIAGNOSIS — M25571 Pain in right ankle and joints of right foot: Secondary | ICD-10-CM | POA: Diagnosis not present

## 2020-05-08 DIAGNOSIS — M9903 Segmental and somatic dysfunction of lumbar region: Secondary | ICD-10-CM | POA: Diagnosis not present

## 2020-05-08 DIAGNOSIS — M62830 Muscle spasm of back: Secondary | ICD-10-CM | POA: Diagnosis not present

## 2020-05-08 DIAGNOSIS — M542 Cervicalgia: Secondary | ICD-10-CM | POA: Diagnosis not present

## 2020-05-08 DIAGNOSIS — M9901 Segmental and somatic dysfunction of cervical region: Secondary | ICD-10-CM | POA: Diagnosis not present

## 2020-05-09 DIAGNOSIS — M25571 Pain in right ankle and joints of right foot: Secondary | ICD-10-CM | POA: Diagnosis not present

## 2020-05-12 DIAGNOSIS — M25571 Pain in right ankle and joints of right foot: Secondary | ICD-10-CM | POA: Diagnosis not present

## 2020-05-14 ENCOUNTER — Ambulatory Visit: Payer: PPO | Admitting: Internal Medicine

## 2020-05-14 ENCOUNTER — Encounter: Payer: Self-pay | Admitting: Internal Medicine

## 2020-05-14 VITALS — BP 132/66 | HR 59

## 2020-05-14 DIAGNOSIS — K602 Anal fissure, unspecified: Secondary | ICD-10-CM

## 2020-05-14 DIAGNOSIS — K6289 Other specified diseases of anus and rectum: Secondary | ICD-10-CM | POA: Diagnosis not present

## 2020-05-14 DIAGNOSIS — Z86010 Personal history of colonic polyps: Secondary | ICD-10-CM

## 2020-05-14 DIAGNOSIS — K625 Hemorrhage of anus and rectum: Secondary | ICD-10-CM

## 2020-05-14 MED ORDER — SUTAB 1479-225-188 MG PO TABS
1.0000 | ORAL_TABLET | Freq: Once | ORAL | 0 refills | Status: AC
Start: 2020-05-14 — End: 2020-05-14

## 2020-05-14 NOTE — Progress Notes (Signed)
HISTORY OF PRESENT ILLNESS:  Susan Jenkins is a 75 y.o. female, new to this practice, who sent today by her primary care provider regarding rectal bleeding.  The patient does have a history of colon polyps and last underwent colonoscopy with Dr. Richmond Campbell November 24, 2015 (reviewed).  The patient has a family history of colon cancer.  Colonoscopy revealed a 10 mm colon polyp, mild sigmoid diverticulosis, and moderate internal hemorrhoids.  Follow-up in 5 years recommended.  Reports to me that she developed problems with rectal bleeding around August of last year.  As part of an evaluation she was sent for contrast-enhanced CT scan of the abdomen and pelvis.  This revealed sigmoid diverticulosis without diverticulitis.  No other significant allergies.  Blood work at that same time revealed hemoglobin of 13.6.  Follow-up hemoglobin November 2021 was also 13.6.  Patient tells me that she has a bowel movement every day or every other day.  She notices blood with every bowel movement.  Sometimes it is bright red with mucus and other times she describes it as a bluish-red.  There is associated rectal pain.  She does notice blood mixed with the stool.  She does have a tendency toward constipation for which she uses MiraLAX as needed.  She remains active and enjoys water aerobics  REVIEW OF SYSTEMS:  All non-GI ROS negative as otherwise stated in the HPI.  Past Medical History:  Diagnosis Date  . Complication of anesthesia   . Depression   . Glaucoma   . Hemorrhoids   . HOCM (hypertrophic obstructive cardiomyopathy) ([place])   . Hyperlipidemia   . Hypertension   . PONV (postoperative nausea and vomiting)   . Pulmonary embolus ([place]) 1971   following surgery  . Vitamin D deficiency     Past Surgical History:  Procedure Laterality Date  . ANKLE SURGERY    . BACK SURGERY    . KNEE SURGERY    . NASAL SEPTUM SURGERY    . 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 Her smoking use included cigarettes. She has never used smokeless tobacco. She reports that she does not drink alcohol and does not use drugs.  family history includes Cancer in her father; Dementia in her mother; Heart failure in her father.  Allergies  Allergen Reactions  . Amoxicillin-Pot Clavulanate Anaphylaxis    Has patient had a PCN reaction causing immediate rash, facial/tongue/throat swelling, SOB or lightheadedness with hypotension: Yes Has patient had a PCN reaction causing severe rash involving mucus membranes or skin necrosis: Yes Has patient had a PCN reaction that required hospitalization Yes Has patient had a PCN reaction occurring within the last 10 years: No If all of the above answers are "NO", then may proceed with Cephalosporin use.  . Augmentin [Amoxicillin-Pot Clavulanate]   . Nickel Rash       PHYSICAL EXAMINATION: Vital signs: BP 132/66   Pulse (!) 59   Constitutional: generally well-appearing, no acute distress Psychiatric: alert and oriented x3, cooperative Eyes: extraocular movements intact, anicteric, conjunctiva pink Mouth: oral pharynx moist, no lesions Neck: supple no lymphadenopathy Cardiovascular: heart regular rate and rhythm, no murmur Lungs: clear to auscultation bilaterally Abdomen: soft, nontender, nondistended, no obvious ascites, no peritoneal signs, normal bowel sounds, no organomegaly Rectal: No  external abnormalities.  Tender posterior fissure Extremities: no, cyanosis, or lower extremity edema bilaterally Skin: no lesions on visible extremities Neuro: No focal deficits.  Cranial nerves intact  ASSESSMENT:  1.  Rectal bleeding.  Likely secondary to seizure noticed on today's physical exam 2.  Rectal pain.  Certainly due to fissure noticed  on today's physical exam. 3.  Family history of colon cancer and personal history of colon polyps.  Last examination July 2017.  Due for surveillance 4.  Tendency toward constipation   PLAN:  1.  Continue MiraLAX 2.  Schedule colonoscopy to provide colorectal neoplasia surveillance and be sure there are no other significant causes for rectal bleeding.The nature of the procedure, as well as the risks, benefits, and alternatives were carefully and thoroughly reviewed with the patient. Ample time for discussion and questions allowed. The patient understood, was satisfied, and agreed to proceed. 3.  Treatment recommendations for fissure post colonoscopy (diltiazem, fiber, sitz bath).

## 2020-05-14 NOTE — Patient Instructions (Signed)
If you are age 73 or older, your body mass index should be between 23-30. Your There is no height or weight on file to calculate BMI. If this is out of the aforementioned range listed, please consider follow up with your Primary Care Provider.  If you are age 64 or younger, your body mass index should be between 19-25. Your There is no height or weight on file to calculate BMI. If this is out of the aformentioned range listed, please consider follow up with your Primary Care Provider.   You have been scheduled for a colonoscopy. Please follow written instructions given to you at your visit today.  Please pick up your prep supplies at the pharmacy within the next 1-3 days. If you use inhalers (even only as needed), please bring them with you on the day of your procedure.   

## 2020-06-05 DIAGNOSIS — M9903 Segmental and somatic dysfunction of lumbar region: Secondary | ICD-10-CM | POA: Diagnosis not present

## 2020-06-05 DIAGNOSIS — M62830 Muscle spasm of back: Secondary | ICD-10-CM | POA: Diagnosis not present

## 2020-06-05 DIAGNOSIS — M9901 Segmental and somatic dysfunction of cervical region: Secondary | ICD-10-CM | POA: Diagnosis not present

## 2020-06-05 DIAGNOSIS — M542 Cervicalgia: Secondary | ICD-10-CM | POA: Diagnosis not present

## 2020-06-17 ENCOUNTER — Other Ambulatory Visit: Payer: Self-pay | Admitting: Family Medicine

## 2020-06-17 DIAGNOSIS — E785 Hyperlipidemia, unspecified: Secondary | ICD-10-CM

## 2020-06-17 DIAGNOSIS — I10 Essential (primary) hypertension: Secondary | ICD-10-CM

## 2020-06-25 ENCOUNTER — Encounter: Payer: Self-pay | Admitting: Internal Medicine

## 2020-07-01 DIAGNOSIS — H532 Diplopia: Secondary | ICD-10-CM | POA: Diagnosis not present

## 2020-07-01 DIAGNOSIS — H401132 Primary open-angle glaucoma, bilateral, moderate stage: Secondary | ICD-10-CM | POA: Diagnosis not present

## 2020-07-01 DIAGNOSIS — H5213 Myopia, bilateral: Secondary | ICD-10-CM | POA: Diagnosis not present

## 2020-07-01 DIAGNOSIS — H2513 Age-related nuclear cataract, bilateral: Secondary | ICD-10-CM | POA: Diagnosis not present

## 2020-07-02 ENCOUNTER — Encounter: Payer: Self-pay | Admitting: Internal Medicine

## 2020-07-02 ENCOUNTER — Ambulatory Visit (AMBULATORY_SURGERY_CENTER): Payer: PPO | Admitting: Internal Medicine

## 2020-07-02 ENCOUNTER — Other Ambulatory Visit: Payer: Self-pay

## 2020-07-02 VITALS — BP 147/62 | HR 55 | Temp 96.8°F | Resp 16

## 2020-07-02 DIAGNOSIS — D124 Benign neoplasm of descending colon: Secondary | ICD-10-CM | POA: Diagnosis not present

## 2020-07-02 DIAGNOSIS — Z86010 Personal history of colonic polyps: Secondary | ICD-10-CM

## 2020-07-02 DIAGNOSIS — Z1211 Encounter for screening for malignant neoplasm of colon: Secondary | ICD-10-CM | POA: Diagnosis not present

## 2020-07-02 DIAGNOSIS — K625 Hemorrhage of anus and rectum: Secondary | ICD-10-CM | POA: Diagnosis not present

## 2020-07-02 DIAGNOSIS — I10 Essential (primary) hypertension: Secondary | ICD-10-CM | POA: Diagnosis not present

## 2020-07-02 MED ORDER — AMBULATORY NON FORMULARY MEDICATION
2 refills | Status: AC
Start: 2020-07-02 — End: ?

## 2020-07-02 MED ORDER — FLEET ENEMA 7-19 GM/118ML RE ENEM
1.0000 | ENEMA | Freq: Once | RECTAL | Status: AC
  Administered 2020-07-02: 1 via RECTAL

## 2020-07-02 MED ORDER — SODIUM CHLORIDE 0.9 % IV SOLN: 500.0000 mL | Freq: Once | INTRAVENOUS | Status: DC

## 2020-07-02 NOTE — Progress Notes (Signed)
Called to room to assist during endoscopic procedure.  Patient ID and intended procedure confirmed with present staff. Received instructions for my participation in the procedure from the performing physician.  

## 2020-07-02 NOTE — Patient Instructions (Signed)
Please read handouts provided. Await pathology results. Continue present medications. Use 2% diltiazem ointment, apply to the anal sphincter 5 times daily as directed. Repeat colonoscopy in 6 months. Use daily stool softeners, fiber, and plenty of water.     YOU HAD AN ENDOSCOPIC PROCEDURE TODAY AT [facility]:   Refer to the procedure report that was given to you for any specific questions about what was found during the examination.  If the procedure report does not answer your questions, please call your gastroenterologist to clarify.  If you requested that your care partner not be given the details of your procedure findings, then the procedure report has been included in a sealed envelope for you to review at your convenience later.  YOU SHOULD EXPECT: Some feelings of bloating in the abdomen. Passage of more gas than usual.  Walking can help get rid of the air that was put into your GI tract during the procedure and reduce the bloating. If you had a lower endoscopy (such as a colonoscopy or flexible sigmoidoscopy) you may notice spotting of blood in your stool or on the toilet paper. If you underwent a bowel prep for your procedure, you may not have a normal bowel movement for a few days.  Please Note:  You might notice some irritation and congestion in your nose or some drainage.  This is from the oxygen used during your procedure.  There is no need for concern and it should clear up in a day or so.  SYMPTOMS TO REPORT IMMEDIATELY:   Following lower endoscopy (colonoscopy or flexible sigmoidoscopy):  Excessive amounts of blood in the stool  Significant tenderness or worsening of abdominal pains  Swelling of the abdomen that is new, acute  Fever of 100F or higher    For urgent or emergent issues, a gastroenterologist can be reached at any hour by calling [phone]. Do not use MyChart messaging for urgent concerns.    DIET:  We do recommend a small meal at  first, but then you may proceed to your regular diet.  Drink plenty of fluids but you should avoid alcoholic beverages for 24 hours.  ACTIVITY:  You should plan to take it easy for the rest of today and you should NOT DRIVE or use heavy machinery until tomorrow (because of the sedation medicines used during the test).    FOLLOW UP: Our staff will call the number listed on your records 48-72 hours following your procedure to check on you and address any questions or concerns that you may have regarding the information given to you following your procedure. If we do not reach you, we will leave a message.  We will attempt to reach you two times.  During this call, we will ask if you have developed any symptoms of COVID 19. If you develop any symptoms (ie: fever, flu-like symptoms, shortness of breath, cough etc.) before then, please call [phone].  If you test positive for Covid 19 in the 2 weeks post procedure, please call and report this information to [place].    If any biopsies were taken you will be contacted by phone or by letter within the next 1-3 weeks.  Please call us at [phone] if you have not heard about the biopsies in 3 weeks.    SIGNATURES/CONFIDENTIALITY: You and/or your care partner have signed paperwork which will be entered into your electronic medical record.  These signatures attest to the fact that that the information above on your  After Visit Summary has been reviewed and is understood.  Full responsibility of the confidentiality of this discharge information lies with you and/or your care-partner.

## 2020-07-02 NOTE — Progress Notes (Signed)
Pt states that she vomited after the second half of her Sutab dose this morning. The results of her prep are cloudy brown. Spoke with Dr. [name] and an enema was ordered and gvien. Results were cloudy yellow liquid.

## 2020-07-02 NOTE — Op Note (Signed)
[facility] Patient Name: Susan Jenkins Procedure Date: 07/02/2020 11:16 AM MRN: [id number] Endoscopist: Docia Chuck. Susan Jenkins , MD Age: 75 Referring MD:  Date of Birth: Apr 30, 1946 Gender: Female Account #: [id number] Procedure:                Colonoscopy with cold snare polypectomy x 1 Indications:              High risk colon cancer surveillance: Personal                            history of colon polyps elsewhere. Last examination                            July 2017 (Dr. Earlean Shawl) with 10 mm polyp. Family                            history of colon cancer in her father around age 83 Medicines:                Monitored Anesthesia Care Procedure:                Pre-Anesthesia Assessment:                           - Prior to the procedure, a History and Physical                            was performed, and patient medications and                            allergies were reviewed. The patient's tolerance of                            previous anesthesia was also reviewed. The risks                            and benefits of the procedure and the sedation                            options and risks were discussed with the patient.                            All questions were answered, and informed consent                            was obtained. Prior Anticoagulants: The patient has                            taken no previous anticoagulant or antiplatelet                            agents. ASA Grade Assessment: II - A patient with                            mild systemic disease. After reviewing the risks  and benefits, the patient was deemed in                            satisfactory condition to undergo the procedure.                           After obtaining informed consent, the colonoscope                            was passed under direct vision. Throughout the                            procedure, the patient's blood pressure, pulse, and                             oxygen saturations were monitored continuously. The                            Colonoscope was introduced through the anus and                            advanced to the the distal transverse colon. The                            exam was terminated at that point due to poor                            prep.. The quality of the bowel preparation was                            poor. The limited colonoscopy was performed without                            difficulty. The patient tolerated the procedure                            well. The bowel preparation used was SUPREP/TABLETS                            via split dose instruction. Patient did not                            tolerate the prep well. Required preprocedure                            cleansing enema. Scope In: Scope Out: Findings:                 A 3 mm polyp was found in the descending colon. The                            polyp was removed with a cold snare. Resection and  retrieval were complete.                           Multiple small and large-mouthed diverticula were                            found in the sigmoid colon.                           The limited exam (to the distal transverse colon)                            was otherwise without abnormality on direct and                            retroflexion views.                           -POOR PREP                           -Anal fissure Complications:            No immediate complications. Estimated blood loss:                            None. Estimated Blood Loss:     Estimated blood loss: none. Impression:               - Preparation of the colon was poor.                           - One 3 mm polyp in the descending colon, removed                            with a cold snare. Resected and retrieved.                           - Diverticulosis in the sigmoid colon.                           - The limited examination was  otherwise normal on                            direct and retroflexion views. Recommendation:           - Repeat colonoscopy in 6 months for surveillance,                            due to poor prep. The patient will require 2-day                            prep. The day prior to the procedure she should                            take a bottle of magnesium citrate in the morning.  Thereafter, a split prep (NOT TABLETS). Also would                            prescribe Zofran 4 mg. Take 1 or 2 prior to each                            prep session to help with nausea.                           - Patient has a contact number available for                            emergencies. The signs and symptoms of potential                            delayed complications were discussed with the                            patient. Return to normal activities tomorrow.                            Written discharge instructions were provided to the                            patient.                           - Resume previous diet.                           - Continue present medications.                           - Await pathology results.                           - Prescribe 2% diltiazem ointment; apply to the                            anal sphincter 5 times daily as directed; multiple                            refills. This is to help the anal fissure heal.                           - Also recommend daily stool softeners, fiber, and                            plenty of water Da Authement N. Susan Pastor, MD 07/02/2020 11:45:31 AM This report has been signed electronically.

## 2020-07-02 NOTE — Progress Notes (Signed)
Report to PACU, RN, vss, BBS= Clear.  

## 2020-07-04 ENCOUNTER — Telehealth: Payer: Self-pay

## 2020-07-04 NOTE — Telephone Encounter (Signed)
  Follow up Call-  Call back number 07/02/2020  Post procedure Call Back phone  # [phone]  Permission to leave phone message Yes  Some recent data might be hidden     Patient questions:  Do you have a fever, pain , or abdominal swelling? No. Pain Score  0 *  Have you tolerated food without any problems? Yes.    Have you been able to return to your normal activities? Yes.    Do you have any questions about your discharge instructions: Diet   No. Medications  No. Follow up visit  No.  Do you have questions or concerns about your Care? No.  Actions: * If pain score is 4 or above: No action needed, pain <4.  1. Have you developed a fever since your procedure? no  2.   Have you had an respiratory symptoms (SOB or cough) since your procedure? no  3.   Have you tested positive for COVID 19 since your procedure no  4.   Have you had any family members/close contacts diagnosed with the COVID 19 since your procedure?  no   If yes to any of these questions please route to Joylene John, RN and Joella Prince, RN

## 2020-07-10 ENCOUNTER — Encounter: Payer: Self-pay | Admitting: Internal Medicine

## 2020-07-10 DIAGNOSIS — M9901 Segmental and somatic dysfunction of cervical region: Secondary | ICD-10-CM | POA: Diagnosis not present

## 2020-07-10 DIAGNOSIS — M542 Cervicalgia: Secondary | ICD-10-CM | POA: Diagnosis not present

## 2020-07-10 DIAGNOSIS — M9903 Segmental and somatic dysfunction of lumbar region: Secondary | ICD-10-CM | POA: Diagnosis not present

## 2020-07-10 DIAGNOSIS — M62830 Muscle spasm of back: Secondary | ICD-10-CM | POA: Diagnosis not present

## 2020-07-30 DIAGNOSIS — H401122 Primary open-angle glaucoma, left eye, moderate stage: Secondary | ICD-10-CM | POA: Diagnosis not present

## 2020-08-06 DIAGNOSIS — H401112 Primary open-angle glaucoma, right eye, moderate stage: Secondary | ICD-10-CM | POA: Diagnosis not present

## 2020-08-07 DIAGNOSIS — M9901 Segmental and somatic dysfunction of cervical region: Secondary | ICD-10-CM | POA: Diagnosis not present

## 2020-08-07 DIAGNOSIS — M9903 Segmental and somatic dysfunction of lumbar region: Secondary | ICD-10-CM | POA: Diagnosis not present

## 2020-08-07 DIAGNOSIS — M542 Cervicalgia: Secondary | ICD-10-CM | POA: Diagnosis not present

## 2020-08-07 DIAGNOSIS — M62830 Muscle spasm of back: Secondary | ICD-10-CM | POA: Diagnosis not present

## 2020-08-26 DIAGNOSIS — Z1231 Encounter for screening mammogram for malignant neoplasm of breast: Secondary | ICD-10-CM | POA: Diagnosis not present

## 2020-08-26 DIAGNOSIS — Z803 Family history of malignant neoplasm of breast: Secondary | ICD-10-CM | POA: Diagnosis not present

## 2020-08-27 LAB — HM MAMMOGRAPHY

## 2020-08-28 ENCOUNTER — Encounter: Payer: Self-pay | Admitting: Family Medicine

## 2020-08-28 ENCOUNTER — Ambulatory Visit: Payer: PPO

## 2020-09-04 ENCOUNTER — Other Ambulatory Visit: Payer: Self-pay

## 2020-09-04 ENCOUNTER — Ambulatory Visit (INDEPENDENT_AMBULATORY_CARE_PROVIDER_SITE_OTHER): Payer: PPO

## 2020-09-04 VITALS — BP 138/62 | HR 52 | Temp 98.4°F | Ht 66.0 in | Wt 232.0 lb

## 2020-09-04 DIAGNOSIS — M62830 Muscle spasm of back: Secondary | ICD-10-CM | POA: Diagnosis not present

## 2020-09-04 DIAGNOSIS — M9903 Segmental and somatic dysfunction of lumbar region: Secondary | ICD-10-CM | POA: Diagnosis not present

## 2020-09-04 DIAGNOSIS — Z0000 Encounter for general adult medical examination without abnormal findings: Secondary | ICD-10-CM

## 2020-09-04 DIAGNOSIS — M542 Cervicalgia: Secondary | ICD-10-CM | POA: Diagnosis not present

## 2020-09-04 DIAGNOSIS — M9901 Segmental and somatic dysfunction of cervical region: Secondary | ICD-10-CM | POA: Diagnosis not present

## 2020-09-04 NOTE — Progress Notes (Signed)
Subjective:   Susan Jenkins is a 75 y.o. female who presents for Medicare Annual (Subsequent) preventive examination.  Review of Systems    N/a  Cardiac Risk Factors include: advanced age (>12men, >1 women);dyslipidemia     Objective:    Today's Vitals   09/04/20 0823  BP: 138/62  Pulse: (!) 52  Temp: 98.4 F (36.9 C)  SpO2: 92%  Weight: 232 lb (105.2 kg)  Height: 5\' 6"  (1.676 m)   Body mass index is 37.45 kg/m.  Advanced Directives 09/04/2020 09/28/2017 07/18/2017 07/13/2017 07/04/2017 09/29/2015 09/29/2015  Does Patient Have a Medical Advance Directive? Yes Yes Yes Yes Yes Yes Yes  Type of Paramedic of [place];Living will - [place];Living will [place];Living will [place];Living will [place];Living will [place];Living will  Does patient want to make changes to medical advance directive? No - Patient declined - No - Patient declined No - Patient declined - No - Patient declined -  Copy of [place] in Chart? No - copy requested - - No - copy requested - No - copy requested -  Would patient like information on creating a medical advance directive? - - - - - No - patient declined information No - patient declined information    Current Medications (verified) Outpatient Encounter Medications as of 09/04/2020  Medication Sig  . AMBULATORY NON FORMULARY MEDICATION 2% Diltiazem ointment Apply to the anal sphincter 5 times daily  . amLODipine (NORVASC) 10 MG tablet TAKE 1 TABLET BY MOUTH EVERY DAY  . aspirin 81 MG tablet Take 81 mg by mouth daily.  . hydrochlorothiazide (HYDRODIURIL) 25 MG tablet TAKE 1 TABLET BY MOUTH EVERY DAY  . Loratadine 10 MG CAPS Take by mouth daily.  . meclizine (ANTIVERT) 25 MG tablet Take 1 tablet (25 mg total) by mouth 3 (three) times daily as needed for dizziness.  Marland Kitchen MELATONIN PO Take 3 mg by mouth  daily.  . Misc Natural Products (GLUCOSAMINE CHONDROITIN ADV PO) Take by mouth 2 (two) times daily.  . Multiple Vitamins-Minerals (MULTIVITAMIN ADULT PO) Take by mouth daily.  Mckinley Jewel Dimesylate (RHOPRESSA OP) Apply to eye.  . Omega-3 Fatty Acids (FISH OIL PO) Take by mouth daily.  . ondansetron (ZOFRAN) 4 MG tablet Take 1 tablet (4 mg total) by mouth every 8 (eight) hours as needed for nausea or vomiting.  Marland Kitchen OVER THE COUNTER MEDICATION Calcium carbonate, vit D, min  . Probiotic Product (PROBIOTIC PO) Take by mouth at bedtime.  . pyridOXINE (VITAMIN B-6) 50 MG tablet Take 50 mg by mouth daily.  . rosuvastatin (CRESTOR) 10 MG tablet TAKE 1 TABLET BY MOUTH EVERY DAY  . timolol (TIMOPTIC) 0.5 % ophthalmic solution Place 1 drop into both eyes 2 (two) times daily.   Marland Kitchen venlafaxine XR (EFFEXOR-XR) 75 MG 24 hr capsule TAKE 1 CAPSULE BY MOUTH EVERY DAY  . ibuprofen (ADVIL,MOTRIN) 200 MG tablet Take 200 mg by mouth as needed. (Patient not taking: Reported on 09/04/2020)   No facility-administered encounter medications on file as of 09/04/2020.    Allergies (verified) Amoxicillin-pot clavulanate, Augmentin [amoxicillin-pot clavulanate], and Nickel   History: Past Medical History:  Diagnosis Date  . Complication of anesthesia   . Depression   . Glaucoma   . Hemorrhoids   . HOCM (hypertrophic obstructive cardiomyopathy) ([place])   . Hyperlipidemia   . Hypertension   . PONV (postoperative nausea and vomiting)   .  Pulmonary embolus ([place]) 1971   following surgery  . Vitamin D deficiency    Past Surgical History:  Procedure Laterality Date  . ANKLE SURGERY    . BACK SURGERY    . KNEE SURGERY    . NASAL SEPTUM SURGERY    . 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 Dementia Mother   . Cancer Father   . Heart  failure Father   . Allergic rhinitis Neg Hx   . Asthma Neg Hx   . Eczema Neg Hx   . Urticaria Neg Hx   . Angioedema Neg Hx    Social History   Socioeconomic History  . Marital status: Single    Spouse name: Not on file  . Number of children: Not on file  . Years of education: Not on file  . Highest education level: Not on file  Occupational History  . Not on file  Tobacco Use  . Smoking status: Former Smoker    Types: Cigarettes    Quit date: 05/03/1980    Years since quitting: 40.3  . Smokeless tobacco: Never Used  . Tobacco comment: a .50 a pack;   Vaping Use  . Vaping Use: Never used  Substance and Sexual Activity  . Alcohol use: No  . Drug use: No  . Sexual activity: Yes  Other Topics Concern  . Not on file  Social History Narrative  . Not on file   Social Determinants of Health   Financial Resource Strain: Low Risk   . Difficulty of Paying Living Expenses: Not hard at all  Food Insecurity: No Food Insecurity  . Worried About Charity fundraiser in the Last Year: Never true  . Ran Out of Food in the Last Year: Never true  Transportation Needs: No Transportation Needs  . Lack of Transportation (Medical): No  . Lack of Transportation (Non-Medical): No  Physical Activity: Sufficiently Active  . Days of Exercise per Week: 5 days  . Minutes of Exercise per Session: 60 min  Stress: No Stress Concern Present  . Feeling of Stress : Not at all  Social Connections: Moderately Integrated  . Frequency of Communication with Friends and Family: More than three times a week  . Frequency of Social Gatherings with Friends and Family: More than three times a week  . Attends Religious Services: 1 to 4 times per year  . Active Member of Clubs or Organizations: Yes  . Attends Archivist Meetings: 1 to 4 times per year  . Marital Status: Never married    Tobacco Counseling Counseling given: Not Answered Comment: a .42 a pack;    Clinical Intake:  Pre-visit  preparation completed: Yes  Pain : No/denies pain     Nutritional Risks: None Diabetes: No  How often do you need to have someone help you when you read instructions, pamphlets, or other written materials from your doctor or pharmacy?: 1 - Never What is the last grade level you completed in school?: high school  Diabetic?no  Interpreter Needed?: No  Information entered by :: L.Kaelan Emami,LPN   Activities of Daily Living In your present state of health, do you have any difficulty performing the following activities: 09/04/2020  Hearing? N  Vision? N  Difficulty concentrating or making decisions? N  Walking or climbing stairs? N  Dressing or bathing? N  Doing errands, shopping? N  Preparing Food and eating ? N  Using the Toilet? N  In the past six months, have you accidently leaked urine? N  Do you have problems with loss of bowel control? N  Managing your Medications? N  Managing your Finances? N  Housekeeping or managing your Housekeeping? N  Some recent data might be hidden    Patient Care Team: Billie Ruddy, MD as PCP - General (Family Medicine) Gaynelle Arabian, MD as Consulting Physician (Orthopedic Surgery) Luberta Mutter, MD as Consulting Physician (Ophthalmology)  Indicate any recent Medical Services you may have received from other than [facility] providers in the past year (date may be approximate).     Assessment:   This is a routine wellness examination for Susan Jenkins.  Hearing/Vision screen  Hearing Screening   125Hz  250Hz  500Hz  1000Hz  2000Hz  3000Hz  4000Hz  6000Hz  8000Hz   Right ear:           Left ear:           Vision Screening Comments: Annual eye exams wears glasses   Dietary issues and exercise activities discussed: Current Exercise Habits: Home exercise routine, Type of exercise: walking, Time (Minutes): 60, Frequency (Times/Week): 5, Weekly Exercise (Minutes/Week): 300, Intensity: Mild, Exercise limited by: None identified  Goals Addressed   None     Depression Screen PHQ 2/9 Scores 09/04/2020 03/18/2020 08/27/2019 09/28/2017 09/28/2017 09/24/2015 04/01/2015  PHQ - 2 Score 0 0 0 0 0 0 0  PHQ- 9 Score - 1 - - 2 - -    Fall Risk Fall Risk  09/04/2020 09/28/2017 09/24/2015 04/01/2015 05/21/2014  Falls in the past year? 0 Yes No No No  Comment - fell in March in the parking lot; Dr. Al Corpus office gyn - - -  Number falls in past yr: 0 1 - - -  Injury with Fall? 0 - - - -  Follow up Falls evaluation completed - - - -    FALL RISK PREVENTION PERTAINING TO THE HOME:  Any stairs in or around the home? No  If so, are there any without handrails? Yes  Home free of loose throw rugs in walkways, pet beds, electrical cords, etc? Yes  Adequate lighting in your home to reduce risk of falls? Yes   ASSISTIVE DEVICES UTILIZED TO PREVENT FALLS:  Life alert? No  Use of a cane, walker or w/c? Yes  Grab bars in the bathroom? No  Shower chair or bench in shower? Yes  Elevated toilet seat or a handicapped toilet? Yes   TIMED UP AND GO:  Was the test performed? Yes .  Length of time to ambulate 10 feet: 7 sec.   Gait steady and fast without use of assistive device  Cognitive Function:     Normal cognitive status assessed by direct observation by this Nurse Health Advisor. No abnormalities found.      Immunizations Immunization History  Administered Date(s) Administered  . Hepatitis A 06/03/2010, 04/14/2011  . Hepatitis B 06/03/2010, 04/14/2011, 09/15/2011  . Influenza Split 03/18/2015  . Influenza Whole 02/20/2013  . Influenza-Unspecified 02/14/2014, 01/29/2016, 03/05/2017  . Moderna Sars-Covid-2 Vaccination 06/01/2019, 07/02/2019, 03/06/2020  . Pneumococcal Conjugate-13 03/06/2014  . Pneumococcal Polysaccharide-23 04/01/2015  . Tdap 06/03/2010, 07/04/2017  . Zoster 01/01/2009  . Zoster Recombinat (Shingrix) 02/17/2018, 06/21/2018    TDAP status: Up to date  Flu Vaccine status: Up to date  Pneumococcal vaccine status: Up to  date  Covid-19 vaccine status: Completed vaccines  Qualifies  for Shingles Vaccine? Yes   Zostavax completed Yes   Shingrix Completed?: No.    Education has been provided regarding the importance of this vaccine. Patient has been advised to call insurance company to determine out of pocket expense if they have not yet received this vaccine. Advised may also receive vaccine at local pharmacy or Health Dept. Verbalized acceptance and understanding.  Screening Tests Health Maintenance  Topic Date Due  . Hepatitis C Screening  Never done  . INFLUENZA VACCINE  12/01/2020  . COLONOSCOPY (Pts 45-32yrs Insurance coverage will need to be confirmed)  07/02/2021  . MAMMOGRAM  08/27/2021  . TETANUS/TDAP  07/05/2027  . DEXA SCAN  Completed  . COVID-19 Vaccine  Completed  . PNA vac Low Risk Adult  Completed  . HPV VACCINES  Aged Out    Health Maintenance  Health Maintenance Due  Topic Date Due  . Hepatitis C Screening  Never done    Colorectal cancer screening: Type of screening: Colonoscopy. Completed 07/02/2020. Repeat every 1 years  Mammogram status: Completed 08/27/2020. Repeat every year  Bone Density status: Completed 11/08/2019. Results reflect: Bone density results: OSTEOPENIA. Repeat every 3 years.  Lung Cancer Screening: (Low Dose CT Chest recommended if Age 67-80 years, 30 pack-year currently smoking OR have quit w/in 15years.) does not qualify.   Lung Cancer Screening Referral: n/a  Additional Screening:  Hepatitis C Screening: does qualify  Vision Screening: Recommended annual ophthalmology exams for early detection of glaucoma and other disorders of the eye. Is the patient up to date with their annual eye exam?  Yes  Who is the provider or what is the name of the office in which the patient attends annual eye exams? Dr.Mcgownen If pt is not established with a provider, would they like to be referred to a provider to establish care? No .   Dental Screening: Recommended  annual dental exams for proper oral hygiene  Community Resource Referral / Chronic Care Management: CRR required this visit?  No   CCM required this visit?  No      Plan:     I have personally reviewed and noted the following in the patient's chart:   . Medical and social history . Use of alcohol, tobacco or illicit drugs  . Current medications and supplements including opioid prescriptions.  . Functional ability and status . Nutritional status . Physical activity . Advanced directives . List of other physicians . Hospitalizations, surgeries, and ER visits in previous 12 months . Vitals . Screenings to include cognitive, depression, and falls . Referrals and appointments  In addition, I have reviewed and discussed with patient certain preventive protocols, quality metrics, and best practice recommendations. A written personalized care plan for preventive services as well as general preventive health recommendations were provided to patient.     Randel Pigg, LPN   [id number]   Nurse Notes: none

## 2020-09-04 NOTE — Patient Instructions (Signed)
Susan Jenkins , Thank you for taking time to come for your Medicare Wellness Visit. I appreciate your ongoing commitment to your health goals. Please review the following plan we discussed and let me know if I can assist you in the future.   Screening recommendations/referrals: Colonoscopy: current due 2023 Mammogram: current due 2023 Bone Density: current due 2024 Recommended yearly ophthalmology/optometry visit for glaucoma screening and checkup Recommended yearly dental visit for hygiene and checkup  Vaccinations: Influenza vaccine: current due fall 2022 Pneumococcal vaccine: completed series Tdap vaccine: current due 07/05/2018 Shingles vaccine: completed the series     Advanced directives: will provided copies   Conditions/risks identified: none   Next appointment: none    Preventive Care 22 Years and Older, Female Preventive care refers to lifestyle choices and visits with your health care provider that can promote health and wellness. What does preventive care include?  A yearly physical exam. This is also called an annual well check.  Dental exams once or twice a year.  Routine eye exams. Ask your health care provider how often you should have your eyes checked.  Personal lifestyle choices, including:  Daily care of your teeth and gums.  Regular physical activity.  Eating a healthy diet.  Avoiding tobacco and drug use.  Limiting alcohol use.  Practicing safe sex.  Taking low-dose aspirin every day.  Taking vitamin and mineral supplements as recommended by your health care provider. What happens during an annual well check? The services and screenings done by your health care provider during your annual well check will depend on your age, overall health, lifestyle risk factors, and family history of disease. Counseling  Your health care provider may ask you questions about your:  Alcohol use.  Tobacco use.  Drug use.  Emotional well-being.  Home and  relationship well-being.  Sexual activity.  Eating habits.  History of falls.  Memory and ability to understand (cognition).  Work and work Statistician.  Reproductive health. Screening  You may have the following tests or measurements:  Height, weight, and BMI.  Blood pressure.  Lipid and cholesterol levels. These may be checked every 5 years, or more frequently if you are over 27 years old.  Skin check.  Lung cancer screening. You may have this screening every year starting at age 79 if you have a 30-pack-year history of smoking and currently smoke or have quit within the past 15 years.  Fecal occult blood test (FOBT) of the stool. You may have this test every year starting at age 32.  Flexible sigmoidoscopy or colonoscopy. You may have a sigmoidoscopy every 5 years or a colonoscopy every 10 years starting at age 31.  Hepatitis C blood test.  Hepatitis B blood test.  Sexually transmitted disease (STD) testing.  Diabetes screening. This is done by checking your blood sugar (glucose) after you have not eaten for a while (fasting). You may have this done every 1-3 years.  Bone density scan. This is done to screen for osteoporosis. You may have this done starting at age 49.  Mammogram. This may be done every 1-2 years. Talk to your health care provider about how often you should have regular mammograms. Talk with your health care provider about your test results, treatment options, and if necessary, the need for more tests. Vaccines  Your health care provider may recommend certain vaccines, such as:  Influenza vaccine. This is recommended every year.  Tetanus, diphtheria, and acellular pertussis (Tdap, Td) vaccine. You may need a Td booster  every 10 years.  Zoster vaccine. You may need this after age 15.  Pneumococcal 13-valent conjugate (PCV13) vaccine. One dose is recommended after age 105.  Pneumococcal polysaccharide (PPSV23) vaccine. One dose is recommended after  age 44. Talk to your health care provider about which screenings and vaccines you need and how often you need them. This information is not intended to replace advice given to you by your health care provider. Make sure you discuss any questions you have with your health care provider. Document Released: 05/16/2015 Document Revised: 01/07/2016 Document Reviewed: 02/18/2015 Elsevier Interactive Patient Education  2017 [place] Prevention in the Home Falls can cause injuries. They can happen to people of all ages. There are many things you can do to make your home safe and to help prevent falls. What can I do on the outside of my home?  Regularly fix the edges of walkways and driveways and fix any cracks.  Remove anything that might make you trip as you walk through a door, such as a raised step or threshold.  Trim any bushes or trees on the path to your home.  Use bright outdoor lighting.  Clear any walking paths of anything that might make someone trip, such as rocks or tools.  Regularly check to see if handrails are loose or broken. Make sure that both sides of any steps have handrails.  Any raised decks and porches should have guardrails on the edges.  Have any leaves, snow, or ice cleared regularly.  Use sand or salt on walking paths during winter.  Clean up any spills in your garage right away. This includes oil or grease spills. What can I do in the bathroom?  Use night lights.  Install grab bars by the toilet and in the tub and shower. Do not use towel bars as grab bars.  Use non-skid mats or decals in the tub or shower.  If you need to sit down in the shower, use a plastic, non-slip stool.  Keep the floor dry. Clean up any water that spills on the floor as soon as it happens.  Remove soap buildup in the tub or shower regularly.  Attach bath mats securely with double-sided non-slip rug tape.  Do not have throw rugs and other things on the floor that can  make you trip. What can I do in the bedroom?  Use night lights.  Make sure that you have a light by your bed that is easy to reach.  Do not use any sheets or blankets that are too big for your bed. They should not hang down onto the floor.  Have a firm chair that has side arms. You can use this for support while you get dressed.  Do not have throw rugs and other things on the floor that can make you trip. What can I do in the kitchen?  Clean up any spills right away.  Avoid walking on wet floors.  Keep items that you use a lot in easy-to-reach places.  If you need to reach something above you, use a strong step stool that has a grab bar.  Keep electrical cords out of the way.  Do not use floor polish or wax that makes floors slippery. If you must use wax, use non-skid floor wax.  Do not have throw rugs and other things on the floor that can make you trip. What can I do with my stairs?  Do not leave any items on the stairs.  Make  sure that there are handrails on both sides of the stairs and use them. Fix handrails that are broken or loose. Make sure that handrails are as long as the stairways.  Check any carpeting to make sure that it is firmly attached to the stairs. Fix any carpet that is loose or worn.  Avoid having throw rugs at the top or bottom of the stairs. If you do have throw rugs, attach them to the floor with carpet tape.  Make sure that you have a light switch at the top of the stairs and the bottom of the stairs. If you do not have them, ask someone to add them for you. What else can I do to help prevent falls?  Wear shoes that:  Do not have high heels.  Have rubber bottoms.  Are comfortable and fit you well.  Are closed at the toe. Do not wear sandals.  If you use a stepladder:  Make sure that it is fully opened. Do not climb a closed stepladder.  Make sure that both sides of the stepladder are locked into place.  Ask someone to hold it for you,  if possible.  Clearly mark and make sure that you can see:  Any grab bars or handrails.  First and last steps.  Where the edge of each step is.  Use tools that help you move around (mobility aids) if they are needed. These include:  Canes.  Walkers.  Scooters.  Crutches.  Turn on the lights when you go into a dark area. Replace any light bulbs as soon as they burn out.  Set up your furniture so you have a clear path. Avoid moving your furniture around.  If any of your floors are uneven, fix them.  If there are any pets around you, be aware of where they are.  Review your medicines with your doctor. Some medicines can make you feel dizzy. This can increase your chance of falling. Ask your doctor what other things that you can do to help prevent falls. This information is not intended to replace advice given to you by your health care provider. Make sure you discuss any questions you have with your health care provider. Document Released: 02/13/2009 Document Revised: 09/25/2015 Document Reviewed: 05/24/2014 Elsevier Interactive Patient Education  2017 [employer].

## 2020-10-02 DIAGNOSIS — M542 Cervicalgia: Secondary | ICD-10-CM | POA: Diagnosis not present

## 2020-10-02 DIAGNOSIS — M9903 Segmental and somatic dysfunction of lumbar region: Secondary | ICD-10-CM | POA: Diagnosis not present

## 2020-10-02 DIAGNOSIS — M9901 Segmental and somatic dysfunction of cervical region: Secondary | ICD-10-CM | POA: Diagnosis not present

## 2020-10-02 DIAGNOSIS — M62830 Muscle spasm of back: Secondary | ICD-10-CM | POA: Diagnosis not present

## 2020-11-06 DIAGNOSIS — M9903 Segmental and somatic dysfunction of lumbar region: Secondary | ICD-10-CM | POA: Diagnosis not present

## 2020-11-06 DIAGNOSIS — M9901 Segmental and somatic dysfunction of cervical region: Secondary | ICD-10-CM | POA: Diagnosis not present

## 2020-11-06 DIAGNOSIS — M62830 Muscle spasm of back: Secondary | ICD-10-CM | POA: Diagnosis not present

## 2020-11-06 DIAGNOSIS — M542 Cervicalgia: Secondary | ICD-10-CM | POA: Diagnosis not present

## 2020-11-20 DIAGNOSIS — D2272 Melanocytic nevi of left lower limb, including hip: Secondary | ICD-10-CM | POA: Diagnosis not present

## 2020-11-20 DIAGNOSIS — D2262 Melanocytic nevi of left upper limb, including shoulder: Secondary | ICD-10-CM | POA: Diagnosis not present

## 2020-11-20 DIAGNOSIS — D1801 Hemangioma of skin and subcutaneous tissue: Secondary | ICD-10-CM | POA: Diagnosis not present

## 2020-11-20 DIAGNOSIS — D225 Melanocytic nevi of trunk: Secondary | ICD-10-CM | POA: Diagnosis not present

## 2020-11-20 DIAGNOSIS — L918 Other hypertrophic disorders of the skin: Secondary | ICD-10-CM | POA: Diagnosis not present

## 2020-11-20 DIAGNOSIS — L814 Other melanin hyperpigmentation: Secondary | ICD-10-CM | POA: Diagnosis not present

## 2020-11-20 DIAGNOSIS — L821 Other seborrheic keratosis: Secondary | ICD-10-CM | POA: Diagnosis not present

## 2020-12-02 DIAGNOSIS — H401112 Primary open-angle glaucoma, right eye, moderate stage: Secondary | ICD-10-CM | POA: Diagnosis not present

## 2020-12-04 DIAGNOSIS — M62830 Muscle spasm of back: Secondary | ICD-10-CM | POA: Diagnosis not present

## 2020-12-04 DIAGNOSIS — M9901 Segmental and somatic dysfunction of cervical region: Secondary | ICD-10-CM | POA: Diagnosis not present

## 2020-12-04 DIAGNOSIS — M9903 Segmental and somatic dysfunction of lumbar region: Secondary | ICD-10-CM | POA: Diagnosis not present

## 2020-12-04 DIAGNOSIS — M542 Cervicalgia: Secondary | ICD-10-CM | POA: Diagnosis not present

## 2021-01-01 DIAGNOSIS — M542 Cervicalgia: Secondary | ICD-10-CM | POA: Diagnosis not present

## 2021-01-01 DIAGNOSIS — M62830 Muscle spasm of back: Secondary | ICD-10-CM | POA: Diagnosis not present

## 2021-01-01 DIAGNOSIS — M9903 Segmental and somatic dysfunction of lumbar region: Secondary | ICD-10-CM | POA: Diagnosis not present

## 2021-01-01 DIAGNOSIS — M9901 Segmental and somatic dysfunction of cervical region: Secondary | ICD-10-CM | POA: Diagnosis not present

## 2021-01-27 ENCOUNTER — Encounter: Payer: Self-pay | Admitting: Internal Medicine

## 2021-02-12 DIAGNOSIS — M9901 Segmental and somatic dysfunction of cervical region: Secondary | ICD-10-CM | POA: Diagnosis not present

## 2021-02-12 DIAGNOSIS — M62830 Muscle spasm of back: Secondary | ICD-10-CM | POA: Diagnosis not present

## 2021-02-12 DIAGNOSIS — M542 Cervicalgia: Secondary | ICD-10-CM | POA: Diagnosis not present

## 2021-02-12 DIAGNOSIS — M9903 Segmental and somatic dysfunction of lumbar region: Secondary | ICD-10-CM | POA: Diagnosis not present

## 2021-02-16 ENCOUNTER — Telehealth: Payer: Self-pay | Admitting: Internal Medicine

## 2021-02-16 NOTE — Telephone Encounter (Signed)
Patient called regarding recall.  Before she schedules, she wants to know what her insurance will pay before she proceeds.  Had last colon 07/2020.  Please advise.  Thank you.

## 2021-03-05 DIAGNOSIS — M9903 Segmental and somatic dysfunction of lumbar region: Secondary | ICD-10-CM | POA: Diagnosis not present

## 2021-03-05 DIAGNOSIS — M62830 Muscle spasm of back: Secondary | ICD-10-CM | POA: Diagnosis not present

## 2021-03-05 DIAGNOSIS — M9901 Segmental and somatic dysfunction of cervical region: Secondary | ICD-10-CM | POA: Diagnosis not present

## 2021-03-05 DIAGNOSIS — M1712 Unilateral primary osteoarthritis, left knee: Secondary | ICD-10-CM | POA: Diagnosis not present

## 2021-03-05 DIAGNOSIS — M542 Cervicalgia: Secondary | ICD-10-CM | POA: Diagnosis not present

## 2021-03-19 DIAGNOSIS — M1712 Unilateral primary osteoarthritis, left knee: Secondary | ICD-10-CM | POA: Diagnosis not present

## 2021-04-09 DIAGNOSIS — M62830 Muscle spasm of back: Secondary | ICD-10-CM | POA: Diagnosis not present

## 2021-04-09 DIAGNOSIS — M9903 Segmental and somatic dysfunction of lumbar region: Secondary | ICD-10-CM | POA: Diagnosis not present

## 2021-04-09 DIAGNOSIS — M9901 Segmental and somatic dysfunction of cervical region: Secondary | ICD-10-CM | POA: Diagnosis not present

## 2021-04-09 DIAGNOSIS — M542 Cervicalgia: Secondary | ICD-10-CM | POA: Diagnosis not present

## 2021-04-29 ENCOUNTER — Other Ambulatory Visit: Payer: Self-pay | Admitting: Family Medicine

## 2021-04-29 DIAGNOSIS — F339 Major depressive disorder, recurrent, unspecified: Secondary | ICD-10-CM

## 2021-06-18 ENCOUNTER — Other Ambulatory Visit: Payer: Self-pay | Admitting: Family Medicine

## 2021-06-18 DIAGNOSIS — I10 Essential (primary) hypertension: Secondary | ICD-10-CM

## 2021-06-19 ENCOUNTER — Other Ambulatory Visit: Payer: Self-pay | Admitting: Family Medicine

## 2021-06-19 DIAGNOSIS — I10 Essential (primary) hypertension: Secondary | ICD-10-CM

## 2021-07-01 ENCOUNTER — Encounter: Payer: Self-pay | Admitting: Family Medicine

## 2021-07-01 ENCOUNTER — Ambulatory Visit (INDEPENDENT_AMBULATORY_CARE_PROVIDER_SITE_OTHER): Payer: PPO | Admitting: Family Medicine

## 2021-07-01 VITALS — BP 149/73 | HR 61 | Temp 98.1°F | Wt 233.6 lb

## 2021-07-01 DIAGNOSIS — R051 Acute cough: Secondary | ICD-10-CM | POA: Diagnosis not present

## 2021-07-01 DIAGNOSIS — H9319 Tinnitus, unspecified ear: Secondary | ICD-10-CM

## 2021-07-01 DIAGNOSIS — J3489 Other specified disorders of nose and nasal sinuses: Secondary | ICD-10-CM | POA: Diagnosis not present

## 2021-07-01 DIAGNOSIS — I10 Essential (primary) hypertension: Secondary | ICD-10-CM | POA: Diagnosis not present

## 2021-07-01 DIAGNOSIS — R197 Diarrhea, unspecified: Secondary | ICD-10-CM | POA: Diagnosis not present

## 2021-07-01 LAB — CBC WITH DIFFERENTIAL/PLATELET
Basophils Absolute: 0.1 10*3/uL (ref 0.0–0.1)
Basophils Relative: 0.8 % (ref 0.0–3.0)
Eosinophils Absolute: 0.2 10*3/uL (ref 0.0–0.7)
Eosinophils Relative: 2.4 % (ref 0.0–5.0)
HCT: 37.5 % (ref 36.0–46.0)
Hemoglobin: 12.9 g/dL (ref 12.0–15.0)
Lymphocytes Relative: 15.2 % (ref 12.0–46.0)
Lymphs Abs: 1.1 10*3/uL (ref 0.7–4.0)
MCHC: 34.4 g/dL (ref 30.0–36.0)
MCV: 100.6 fl — ABNORMAL HIGH (ref 78.0–100.0)
Monocytes Absolute: 0.7 10*3/uL (ref 0.1–1.0)
Monocytes Relative: 10.4 % (ref 3.0–12.0)
Neutro Abs: 5.1 10*3/uL (ref 1.4–7.7)
Neutrophils Relative %: 71.2 % (ref 43.0–77.0)
Platelets: 294 10*3/uL (ref 150.0–400.0)
RBC: 3.73 Mil/uL — ABNORMAL LOW (ref 3.87–5.11)
RDW: 13.2 % (ref 11.5–15.5)
WBC: 7.2 10*3/uL (ref 4.0–10.5)

## 2021-07-01 LAB — COMPREHENSIVE METABOLIC PANEL
ALT: 20 U/L (ref 0–35)
AST: 24 U/L (ref 0–37)
Albumin: 4.4 g/dL (ref 3.5–5.2)
Alkaline Phosphatase: 47 U/L (ref 39–117)
BUN: 19 mg/dL (ref 6–23)
CO2: 31 mEq/L (ref 19–32)
Calcium: 10.1 mg/dL (ref 8.4–10.5)
Chloride: 94 mEq/L — ABNORMAL LOW (ref 96–112)
Creatinine, Ser: 0.83 mg/dL (ref 0.40–1.20)
GFR: 69.99 mL/min (ref >60.00)
Glucose, Bld: 122 mg/dL — ABNORMAL HIGH (ref 70–99)
Potassium: 3.5 mEq/L (ref 3.5–5.1)
Sodium: 132 mEq/L — ABNORMAL LOW (ref 135–145)
Total Bilirubin: 0.3 mg/dL (ref 0.2–1.2)
Total Protein: 7.1 g/dL (ref 6.0–8.3)

## 2021-07-01 LAB — BASIC METABOLIC PANEL
BUN: 19 mg/dL (ref 6–23)
CO2: 31 mEq/L (ref 19–32)
Calcium: 10.1 mg/dL (ref 8.4–10.5)
Chloride: 94 mEq/L — ABNORMAL LOW (ref 96–112)
Creatinine, Ser: 0.83 mg/dL (ref 0.40–1.20)
GFR: 69.99 mL/min (ref >60.00)
Glucose, Bld: 122 mg/dL — ABNORMAL HIGH (ref 70–99)
Potassium: 3.5 mEq/L (ref 3.5–5.1)
Sodium: 132 mEq/L — ABNORMAL LOW (ref 135–145)

## 2021-07-01 MED ORDER — HYDROCHLOROTHIAZIDE 25 MG PO TABS: 25.0000 mg | ORAL_TABLET | Freq: Every day | ORAL | 3 refills | Status: DC

## 2021-07-01 MED ORDER — AMLODIPINE BESYLATE 10 MG PO TABS: 10.0000 mg | ORAL_TABLET | Freq: Every day | ORAL | 3 refills | Status: DC

## 2021-07-01 NOTE — Progress Notes (Signed)
Subjective:  ? ? Patient ID: Susan Jenkins, female    DOB: March 25, 1946, 76 y.o.   MRN: [id number] ? ?Chief Complaint  ?Patient presents with  ? Follow-up  ?  Medications- amlodipine took last pill today.  ? ? ?HPI ?Patient lost to follow-up, last seen 03/2020 who presents today for med refill/follow-up and acute concern.  Patient requesting refill.  States took last dose of Norvasc this morning.  Also taking hydrochlorothiazide daily.  Patient notes diarrhea, slight cough, rhinorrhea, ringing in ears, chills, and mild dizziness since yesterday.  States stools were foul-smelling.  Having several watery, loose stools per day.  Patient returned from a mission trip in [place].  Denies fever, headache, facial pain/pressure, ear pain/pressure, sore throat, nausea, vomiting.  Patient states she drank mostly bottled water, did have water from tap at one point.  Tried Pepto-Bismol for symptoms.  Denies sick contacts.  Appetite is okay. ? ?Patient endorses colonoscopy scheduled next month with Dr. [name] 2/2 incomplete bowel prep during last colonoscopy.  Previously used pills for bowel prep which she could not keep down 2/2 emesis. ? ?Past Medical History:  ?Diagnosis Date  ? Complication of anesthesia   ? Depression   ? Glaucoma   ? Hemorrhoids   ? HOCM (hypertrophic obstructive cardiomyopathy) ([place])   ? Hyperlipidemia   ? Hypertension   ? PONV (postoperative nausea and vomiting)   ? Pulmonary embolus ([place]) 1971  ? following surgery  ? Vitamin D deficiency   ? ? ?Allergies  ?Allergen Reactions  ? Amoxicillin-Pot Clavulanate Anaphylaxis  ?  Has patient had a PCN reaction causing immediate rash, facial/tongue/throat swelling, SOB or lightheadedness with hypotension: Yes ?Has patient had a PCN reaction causing severe rash involving mucus membranes or skin necrosis: Yes ?Has patient had a PCN reaction that required hospitalization Yes ?Has patient had a PCN reaction occurring within the last 10 years: No ?If all of the  above answers are "NO", then may proceed with Cephalosporin use.  ? Augmentin [Amoxicillin-Pot Clavulanate]   ? Nickel Rash  ? ? ?ROS ?General: Denies fever, chills, night sweats, changes in weight, changes in appetite ?HEENT: Denies headaches, ear pain, changes in vision, sore throat + rhinorrhea, dizziness, tinnitus ?CV: Denies CP, palpitations, SOB, orthopnea ?Pulm: Denies SOB, cough, wheezing + cough ?GI: Denies nausea, vomiting, constipation + diarrhea, bloating, cramping ?GU: Denies dysuria, hematuria, frequency, vaginal discharge ?Msk: Denies muscle cramps, joint pains ?Neuro: Denies weakness, numbness, tingling ?Skin: Denies rashes, bruising ?Psych: Denies depression, anxiety, hallucinations ? ? ?Objective:  ?  ?Blood pressure (!) 149/73, pulse 61, temperature 98.1 ?F (36.7 ?C), temperature source Oral, weight 233 lb 9.6 oz (106 kg), SpO2 98 %. ? ?Gen. Pleasant, well-nourished, in no distress, normal affect   ?HEENT: [place]/AT, face symmetric, conjunctiva clear, no scleral icterus, PERRLA, EOMI, nares patent without drainage, pharynx without erythema or exudate.  TMs normal bilaterally.  No cervical lymphadenopathy. ?Lungs: no accessory muscle use, CTAB, no wheezes or rales ?Cardiovascular: RRR, no m/r/g, no peripheral edema ?Abdomen: BS present, soft, NT/ND. ?Musculoskeletal: No deformities, no cyanosis or clubbing, normal tone ?Neuro:  A&Ox3, CN II-XII intact, normal gait ?Skin:  Warm, no lesions/ rash ? ? ?Wt Readings from Last 3 Encounters:  ?07/01/21 233 lb 9.6 oz (106 kg)  ?09/04/20 232 lb (105.2 kg)  ?12/14/19 234 lb 3.2 oz (106.2 kg)  ? ? ?Lab Results  ?Component Value Date  ? WBC 6.8 03/13/2020  ? HGB 13.6 03/13/2020  ? HCT 38.5 03/13/2020  ? PLT  253 03/13/2020  ? GLUCOSE 109 (H) 12/14/2019  ? CHOL 202 (H) 08/27/2019  ? TRIG 155.0 (H) 08/27/2019  ? HDL 63.70 08/27/2019  ? LDLDIRECT 133.0 03/08/2018  ? [place] 107 (H) 08/27/2019  ? ALT 25 08/27/2019  ? AST 26 08/27/2019  ? NA 136 12/14/2019  ? K 4.0  12/14/2019  ? CL 98 12/14/2019  ? CREATININE 0.79 12/14/2019  ? BUN 16 12/14/2019  ? CO2 31 12/14/2019  ? TSH 3.88 05/11/2016  ? INR 0.94 07/13/2017  ? HGBA1C 6.1 08/27/2019  ? ? ?Assessment/Plan: ? ?Essential hypertension  ?-Elevated. ?-BP initially 171/66.  Will repeat by this provider 149/73. ?-Possibly 2/2 dehydration, increased stress, diet ?-Discussed the importance of lifestyle modifications ?-Continue Norvasc 10 mg daily and hydrochlorothiazide ?-Patient encouraged to check BP at home and keep a log to bring with you to clinic ?- Plan: Basic metabolic panel, amLODipine (NORVASC) 10 MG tablet, hydrochlorothiazide (HYDRODIURIL) 25 MG tablet, CMP ? ?Diarrhea, unspecified type ?-New problem ?-Discussed possible causes including viral gastroenteritis, bacterial or parasitic infection given recent travel. ?-Discussed treatment of symptoms with OTC antidiarrheal medications ?-Discussed the importance of hydration ?-Bland diet advance as tolerated ?-For continued or worsening symptoms in the next few days obtain stool studies ?- Plan: CBC with Differential/Platelet, CMP ? ?Rhinorrhea ?-The problem ?-Discussed possible causes including viral URI versus allergic rhinitis ?-OTC cough/cold medications, antihistamines ?-Continue to monitor ? ?Tinnitus, unspecified laterality ?-Likely 2/2 acute illness ?-Consider saline nasal rinse or Flonase nasal spray ? ?Acute cough ?-Likely 2/2 viral etiology versus postnasal drainage from allergies ?-Treatment of symptoms with OTC cough/cold medication, saline nasal rinse, cough drops, warm fluids, honey, lemon, etc. ? - Plan: CBC with Differential/Platelet ? ?F/u as needed ? ?Grier Mitts, MD ?

## 2021-07-14 ENCOUNTER — Other Ambulatory Visit: Payer: Self-pay

## 2021-07-14 ENCOUNTER — Other Ambulatory Visit (INDEPENDENT_AMBULATORY_CARE_PROVIDER_SITE_OTHER): Payer: PPO

## 2021-07-14 DIAGNOSIS — I10 Essential (primary) hypertension: Secondary | ICD-10-CM | POA: Diagnosis not present

## 2021-07-14 LAB — BASIC METABOLIC PANEL
BUN: 10 mg/dL (ref 6–23)
CO2: 30 mEq/L (ref 19–32)
Calcium: 10 mg/dL (ref 8.4–10.5)
Chloride: 99 mEq/L (ref 96–112)
Creatinine, Ser: 0.76 mg/dL (ref 0.40–1.20)
GFR: 77.78 mL/min (ref >60.00)
Glucose, Bld: 112 mg/dL — ABNORMAL HIGH (ref 70–99)
Potassium: 4.1 mEq/L (ref 3.5–5.1)
Sodium: 136 mEq/L (ref 135–145)

## 2021-07-25 ENCOUNTER — Other Ambulatory Visit: Payer: Self-pay | Admitting: Family Medicine

## 2021-07-25 DIAGNOSIS — F339 Major depressive disorder, recurrent, unspecified: Secondary | ICD-10-CM

## 2021-07-29 ENCOUNTER — Other Ambulatory Visit: Payer: Self-pay | Admitting: Family Medicine

## 2021-07-29 DIAGNOSIS — E785 Hyperlipidemia, unspecified: Secondary | ICD-10-CM

## 2021-08-03 ENCOUNTER — Encounter: Payer: Self-pay | Admitting: Internal Medicine

## 2021-08-03 ENCOUNTER — Ambulatory Visit: Payer: PPO | Admitting: Internal Medicine

## 2021-08-03 VITALS — BP 138/58 | HR 52 | Ht 66.5 in | Wt 234.0 lb

## 2021-08-03 DIAGNOSIS — K5901 Slow transit constipation: Secondary | ICD-10-CM

## 2021-08-03 DIAGNOSIS — Z86010 Personal history of colonic polyps: Secondary | ICD-10-CM

## 2021-08-03 DIAGNOSIS — K602 Anal fissure, unspecified: Secondary | ICD-10-CM

## 2021-08-03 DIAGNOSIS — K625 Hemorrhage of anus and rectum: Secondary | ICD-10-CM | POA: Diagnosis not present

## 2021-08-03 MED ORDER — NA SULFATE-K SULFATE-MG SULF 17.5-3.13-1.6 GM/177ML PO SOLN: 1.0000 | Freq: Once | ORAL | 0 refills | Status: AC

## 2021-08-03 MED ORDER — DILTIAZEM GEL 2 %
1.0000 | Freq: Two times a day (BID) | CUTANEOUS | 1 refills | Status: DC
Start: 2021-08-03 — End: 2023-09-22

## 2021-08-03 MED ORDER — ONDANSETRON HCL 8 MG PO TABS: 8.0000 mg | ORAL_TABLET | Freq: Three times a day (TID) | ORAL | 0 refills | Status: DC | PRN

## 2021-08-03 NOTE — Progress Notes (Signed)
HISTORY OF PRESENT ILLNESS: ? ?Susan Jenkins is a 76 y.o. female with past medical history as listed below.  She also has a history of adenomatous colon polyps and a family history of colon cancer (father around age 25).  Previous colonoscopy elsewhere in 2017.  I saw the patient in the office January 2022 with rectal bleeding and rectal pain.  She was diagnosed with an anal fissure.  She was treated with diltiazem.  She subsequently underwent routine surveillance colonoscopy July 02, 2020.  Unfortunately, the preparation was poor and the exam incomplete.  A diminutive adenoma was removed time.  She presents today to schedule her colonoscopy with adjusted prep.  She does tell me that she continues with chronic constipation for which she takes Colace and Metamucil.  She does notice that with constipated bowel movement she will have difficulties with her fissure.  Diltiazem did help in the past.  She does request refill of the medication.  No additional GI complaints.  Overall health otherwise stable ? ?REVIEW OF SYSTEMS: ? ?All non-GI ROS negative unless otherwise stated in the HPI except for arthritis ? ?Past Medical History:  ?Diagnosis Date  ? Complication of anesthesia   ? Depression   ? Glaucoma   ? Hemorrhoids   ? History of diverticulitis 09/2015  ? HOCM (hypertrophic obstructive cardiomyopathy) ([place])   ? Hyperlipidemia   ? Hypertension   ? PONV (postoperative nausea and vomiting)   ? Pulmonary embolus ([place]) 1971  ? following surgery  ? Vitamin D deficiency   ? ? ?Past Surgical History:  ?Procedure Laterality Date  ? ANKLE SURGERY    ? BACK SURGERY    ? NASAL SEPTUM SURGERY    ? 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 Procedure: RIGHT TOTAL KNEE ARTHROPLASTY;  Surgeon: Gaynelle Arabian, MD;  Location: [facility];  Service: Orthopedics;  Laterality: Right;  ? ? ?Social History ?Susan Jenkins  reports that she quit smoking about 41 years ago. Her smoking use included cigarettes. She has never  used smokeless tobacco. She reports current alcohol use. She reports that she does not use drugs. ? ?family history includes Colon cancer (age of onset: 57) in her father; Dementia in her mother; Heart failure in her father. ? ?Allergies  ?Allergen Reactions  ? Amoxicillin-Pot Clavulanate Anaphylaxis  ?  Has patient had a PCN reaction causing immediate rash, facial/tongue/throat swelling, SOB or lightheadedness with hypotension: Yes ?Has patient had a PCN reaction causing severe rash involving mucus membranes or skin necrosis: Yes ?Has patient had a PCN reaction that required hospitalization Yes ?Has patient had a PCN reaction occurring within the last 10 years: No ?If all of the above answers are "NO", then may proceed with Cephalosporin use.  ? Augmentin [Amoxicillin-Pot Clavulanate]   ? Nickel Rash  ? ? ?  ? ?PHYSICAL EXAMINATION: ?Vital signs: BP (!) 138/58   Pulse (!) 52   Ht 5' 6.5" (1.689 m)   Wt 234 lb (106.1 kg)   SpO2 97%   BMI 37.20 kg/m?   ?Constitutional: generally well-appearing, no acute distress ?Psychiatric: alert and oriented x3, cooperative ?Eyes: extraocular movements intact, anicteric, conjunctiva pink ?Mouth: oral pharynx moist, no lesions ?Neck: supple no lymphadenopathy ?Cardiovascular: heart regular rate and rhythm, no murmur ?Lungs: clear to auscultation bilaterally ?Abdomen: soft, nontender, nondistended, no obvious ascites, no peritoneal signs, normal bowel sounds, no organomegaly ?Rectal: Deferred until colonoscopy ?Extremities: no clubbing, cyanosis, or lower extremity edema bilaterally ?Skin: no lesions on visible extremities ?Neuro: No  focal deficits.  Cranial nerves intact ? ?ASSESSMENT: ? ?1.  Chronic constipation ?2.  Anal fissure ?3.  History of adenomatous colon polyps and family history of colon cancer.  Due for surveillance ?4.  Colonoscopy 2022 aborted due to poor prep ? ? ?PLAN: ? ?1.  Discussed regular MiraLAX use for constipation.  The proper way to use the medication  and medication risks reviewed. ?2.  Refill diltiazem 2% ointment for anal fissure.  Proper way to use the medication and medication risks reviewed. ?3.  Schedule surveillance colonoscopy. The nature of the procedure, as well as the risks, benefits, and alternatives were carefully and thoroughly reviewed with the patient. Ample time for discussion and questions allowed. The patient understood, was satisfied, and agreed to proceed. ?4.  The patient will require 2-day prep with 2 days of clear liquids, magnesium citrate (or MiraLAX) in the morning the day prior to the procedure followed by split Suprep (not tablets).  Also, we will prescribe Zofran 8 mg.  Dispense 2.  Take 120 minutes before each prep session.  Use hard candy for nausea as well. ? ? ? ? ?  ?

## 2021-08-03 NOTE — Patient Instructions (Signed)
If you are age 74 or older, your body mass index should be between 23-30. Your Body mass index is 37.2 kg/m?Marland Kitchen If this is out of the aforementioned range listed, please consider follow up with your Primary Care Provider. ? ?If you are age 18 or younger, your body mass index should be between 19-25. Your Body mass index is 37.2 kg/m?Marland Kitchen If this is out of the aformentioned range listed, please consider follow up with your Primary Care Provider.  ? ?________________________________________________________ ? ?The [facility] providers would like to encourage you to use [facility] to communicate with providers for non-urgent requests or questions.  Due to long hold times on the telephone, sending your provider a message by [facility] may be a faster and more efficient way to get a response.  Please allow 48 business hours for a response.  Please remember that this is for non-urgent requests.  ?_______________________________________________________ ? ?We have sent the following medications to your pharmacy for you to pick up at your convenience: Zofran, Suprep ? ?We have sent a prescription Diltiazem gel to [facility]. You should apply a pea size amount to your rectum three times daily x 6-8 weeks. ? ?[facility]'s information is below: ?Address: [address] [place], [place] [address]  ?[phone] ? ?*Please DO NOT go directly from our office to pick up this medication! Give the pharmacy 1 day to process the prescription as this is compounded and takes time to make. ? ? ?

## 2021-08-27 LAB — HM MAMMOGRAPHY

## 2021-08-28 ENCOUNTER — Encounter: Payer: Self-pay | Admitting: Family Medicine

## 2021-09-09 ENCOUNTER — Ambulatory Visit (INDEPENDENT_AMBULATORY_CARE_PROVIDER_SITE_OTHER): Payer: PPO

## 2021-09-09 VITALS — Ht 66.5 in | Wt 234.0 lb

## 2021-09-09 DIAGNOSIS — Z0000 Encounter for general adult medical examination without abnormal findings: Secondary | ICD-10-CM

## 2021-09-09 NOTE — Patient Instructions (Addendum)
?Ms. Prestigiacomo , ?Thank you for taking time to come for your Medicare Wellness Visit. I appreciate your ongoing commitment to your health goals. Please review the following plan we discussed and let me know if I can assist you in the future.  ? ?These are the goals we discussed: ? Goals   ? ?   Patient Stated (pt-stated)   ?   I Will continue to play sports and remain socially active.  ?  ? ?  ? ? ?Advanced directives: Yes Patient will submit copy ? ?Conditions/risks identified: None ? ?Next appointment: Follow up in one year for your annual wellness visit  ? ? ?Preventive Care 38 Years and Older, Female ?Preventive care refers to lifestyle choices and visits with your health care provider that can promote health and wellness. ?What does preventive care include? ?A yearly physical exam. This is also called an annual well check. ?Dental exams once or twice a year. ?Routine eye exams. Ask your health care provider how often you should have your eyes checked. ?Personal lifestyle choices, including: ?Daily care of your teeth and gums. ?Regular physical activity. ?Eating a healthy diet. ?Avoiding tobacco and drug use. ?Limiting alcohol use. ?Practicing safe sex. ?Taking low-dose aspirin every day. ?Taking vitamin and mineral supplements as recommended by your health care provider. ?What happens during an annual well check? ?The services and screenings done by your health care provider during your annual well check will depend on your age, overall health, lifestyle risk factors, and family history of disease. ?Counseling  ?Your health care provider may ask you questions about your: ?Alcohol use. ?Tobacco use. ?Drug use. ?Emotional well-being. ?Home and relationship well-being. ?Sexual activity. ?Eating habits. ?History of falls. ?Memory and ability to understand (cognition). ?Work and work Statistician. ?Reproductive health. ?Screening  ?You may have the following tests or measurements: ?Height, weight, and BMI. ?Blood  pressure. ?Lipid and cholesterol levels. These may be checked every 5 years, or more frequently if you are over 43 years old. ?Skin check. ?Lung cancer screening. You may have this screening every year starting at age 23 if you have a 30-pack-year history of smoking and currently smoke or have quit within the past 15 years. ?Fecal occult blood test (FOBT) of the stool. You may have this test every year starting at age 64. ?Flexible sigmoidoscopy or colonoscopy. You may have a sigmoidoscopy every 5 years or a colonoscopy every 10 years starting at age 24. ?Hepatitis C blood test. ?Hepatitis B blood test. ?Sexually transmitted disease (STD) testing. ?Diabetes screening. This is done by checking your blood sugar (glucose) after you have not eaten for a while (fasting). You may have this done every 1-3 years. ?Bone density scan. This is done to screen for osteoporosis. You may have this done starting at age 93. ?Mammogram. This may be done every 1-2 years. Talk to your health care provider about how often you should have regular mammograms. ?Talk with your health care provider about your test results, treatment options, and if necessary, the need for more tests. ?Vaccines  ?Your health care provider may recommend certain vaccines, such as: ?Influenza vaccine. This is recommended every year. ?Tetanus, diphtheria, and acellular pertussis (Tdap, Td) vaccine. You may need a Td booster every 10 years. ?Zoster vaccine. You may need this after age 31. ?Pneumococcal 13-valent conjugate (PCV13) vaccine. One dose is recommended after age 14. ?Pneumococcal polysaccharide (PPSV23) vaccine. One dose is recommended after age 42. ?Talk to your health care provider about which screenings and vaccines you  need and how often you need them. ?This information is not intended to replace advice given to you by your health care provider. Make sure you discuss any questions you have with your health care provider. ?Document Released:  05/16/2015 Document Revised: 01/07/2016 Document Reviewed: 02/18/2015 ?Elsevier Interactive Patient Education ? 2017 [place]. ? ?Fall Prevention in the Home ?Falls can cause injuries. They can happen to people of all ages. There are many things you can do to make your home safe and to help prevent falls. ?What can I do on the outside of my home? ?Regularly fix the edges of walkways and driveways and fix any cracks. ?Remove anything that might make you trip as you walk through a door, such as a raised step or threshold. ?Trim any bushes or trees on the path to your home. ?Use bright outdoor lighting. ?Clear any walking paths of anything that might make someone trip, such as rocks or tools. ?Regularly check to see if handrails are loose or broken. Make sure that both sides of any steps have handrails. ?Any raised decks and porches should have guardrails on the edges. ?Have any leaves, snow, or ice cleared regularly. ?Use sand or salt on walking paths during winter. ?Clean up any spills in your garage right away. This includes oil or grease spills. ?What can I do in the bathroom? ?Use night lights. ?Install grab bars by the toilet and in the tub and shower. Do not use towel bars as grab bars. ?Use non-skid mats or decals in the tub or shower. ?If you need to sit down in the shower, use a plastic, non-slip stool. ?Keep the floor dry. Clean up any water that spills on the floor as soon as it happens. ?Remove soap buildup in the tub or shower regularly. ?Attach bath mats securely with double-sided non-slip rug tape. ?Do not have throw rugs and other things on the floor that can make you trip. ?What can I do in the bedroom? ?Use night lights. ?Make sure that you have a light by your bed that is easy to reach. ?Do not use any sheets or blankets that are too big for your bed. They should not hang down onto the floor. ?Have a firm chair that has side arms. You can use this for support while you get dressed. ?Do not have  throw rugs and other things on the floor that can make you trip. ?What can I do in the kitchen? ?Clean up any spills right away. ?Avoid walking on wet floors. ?Keep items that you use a lot in easy-to-reach places. ?If you need to reach something above you, use a strong step stool that has a grab bar. ?Keep electrical cords out of the way. ?Do not use floor polish or wax that makes floors slippery. If you must use wax, use non-skid floor wax. ?Do not have throw rugs and other things on the floor that can make you trip. ?What can I do with my stairs? ?Do not leave any items on the stairs. ?Make sure that there are handrails on both sides of the stairs and use them. Fix handrails that are broken or loose. Make sure that handrails are as long as the stairways. ?Check any carpeting to make sure that it is firmly attached to the stairs. Fix any carpet that is loose or worn. ?Avoid having throw rugs at the top or bottom of the stairs. If you do have throw rugs, attach them to the floor with carpet tape. ?Make sure that  you have a light switch at the top of the stairs and the bottom of the stairs. If you do not have them, ask someone to add them for you. ?What else can I do to help prevent falls? ?Wear shoes that: ?Do not have high heels. ?Have rubber bottoms. ?Are comfortable and fit you well. ?Are closed at the toe. Do not wear sandals. ?If you use a stepladder: ?Make sure that it is fully opened. Do not climb a closed stepladder. ?Make sure that both sides of the stepladder are locked into place. ?Ask someone to hold it for you, if possible. ?Clearly mark and make sure that you can see: ?Any grab bars or handrails. ?First and last steps. ?Where the edge of each step is. ?Use tools that help you move around (mobility aids) if they are needed. These include: ?Canes. ?Walkers. ?Scooters. ?Crutches. ?Turn on the lights when you go into a dark area. Replace any light bulbs as soon as they burn out. ?Set up your furniture so  you have a clear path. Avoid moving your furniture around. ?If any of your floors are uneven, fix them. ?If there are any pets around you, be aware of where they are. ?Review your medicines with your doctor. Some

## 2021-09-09 NOTE — Progress Notes (Signed)
? ?Subjective:  ? Susan Jenkins is a 76 y.o. female who presents for Medicare Annual (Subsequent) preventive examination. ? ?Review of Systems    ?Virtual Visit via Telephone Note ? ?I connected with  Susan Jenkins on 09/09/21 at  9:00 AM EDT by telephone and verified that I am speaking with the correct person using two identifiers. ? ?Location: ?Patient: Home ?Provider: Office ?Persons participating in the virtual visit: patient/Nurse Health Advisor ?  ?I discussed the limitations, risks, security and privacy concerns of performing an evaluation and management service by telephone and the availability of in person appointments. The patient expressed understanding and agreed to proceed. ? ?Interactive audio and video telecommunications were attempted between this nurse and patient, however failed, due to patient having technical difficulties OR patient did not have access to video capability.  We continued and completed visit with audio only. ? ?Some vital signs may be absent or patient reported.  ? ?[name], LPN  ?Cardiac Risk Factors include: advanced age (>80mn, >>63women);hypertension ? ?   ?Objective:  ?  ?Today's Vitals  ? 09/09/21 0902  ?Weight: 234 lb (106.1 kg)  ?Height: 5' 6.5" (1.689 m)  ? ?Body mass index is 37.2 kg/m?. ? ? ?  09/09/2021  ?  9:11 AM 09/04/2020  ?  8:31 AM 09/28/2017  ? 11:56 AM 07/18/2017  ?  3:06 PM 07/13/2017  ? 11:31 AM 07/04/2017  ? 11:20 AM 09/29/2015  ?  5:15 PM  ?Advanced Directives  ?Does Patient Have a Medical Advance Directive? Yes Yes Yes Yes Yes Yes Yes  ?Type of AParamedicof AFort RansomLiving will HGlen DaleLiving will  H[place]spectLiving will H[place]Living will H[place]Living will H[place]haliaLiving will  ?Does patient want to make changes to medical advance directive? No - Patient declined No - Patient declined  No - Patient declined No  - Patient declined  No - Patient declined  ?Copy of [place] Chart? No - copy requested No - copy requested   No - copy requested  No - copy requested  ?Would patient like information on creating a medical advance directive?       No - patient declined information  ? ? ?Current Medications (verified) ?Outpatient Encounter Medications as of 09/09/2021  ?Medication Sig  ? Acetaminophen (TYLENOL ARTHRITIS EXT RELIEF PO) Take by mouth in the morning and at bedtime.  ? AMBULATORY NON FORMULARY MEDICATION 2% Diltiazem ointment ?Apply to the anal sphincter 5 times daily (Patient taking differently: daily as needed. 2% Diltiazem ointment ?Apply to the anal sphincter 5 times daily)  ? amLODipine (NORVASC) 10 MG tablet Take 1 tablet (10 mg total) by mouth daily.  ? aspirin 81 MG tablet Take 81 mg by mouth daily.  ? diltiazem 2 % GEL Apply 1 application. topically 2 (two) times daily.  ? docusate sodium (COLACE) 100 MG capsule Take 100 mg by mouth 2 (two) times daily.  ? hydrochlorothiazide (HYDRODIURIL) 25 MG tablet Take 1 tablet (25 mg total) by mouth daily.  ? ibuprofen (ADVIL,MOTRIN) 200 MG tablet Take 200 mg by mouth as needed.  ? Loratadine 10 MG CAPS Take by mouth daily.  ? meclizine (ANTIVERT) 25 MG tablet Take 1 tablet (25 mg total) by mouth 3 (three) times daily as needed for dizziness.  ? MELATONIN PO Take 3 mg by mouth daily.  ? Metamucil Fiber CHEW Chew by mouth daily as  needed.  ? Misc Natural Products (GLUCOSAMINE CHONDROITIN ADV PO) Take by mouth 2 (two) times daily.  ? Multiple Vitamins-Minerals (MULTIVITAMIN ADULT PO) Take by mouth daily.  ? Netarsudil Dimesylate (RHOPRESSA OP) Apply to eye.  ? Omega-3 Fatty Acids (FISH OIL PO) Take by mouth daily.  ? ondansetron (ZOFRAN) 8 MG tablet Take 1 tablet (8 mg total) by mouth every 8 (eight) hours as needed for nausea or vomiting. Take one 20 minutes before drinking each half of the prep  ? OVER THE COUNTER MEDICATION Calcium carbonate, vit D,  min  ? Probiotic Product (PROBIOTIC PO) Take by mouth at bedtime.  ? pyridOXINE (VITAMIN B-6) 50 MG tablet Take 50 mg by mouth daily.  ? rosuvastatin (CRESTOR) 10 MG tablet TAKE 1 TABLET BY MOUTH EVERY DAY  ? timolol (TIMOPTIC) 0.5 % ophthalmic solution Place 1 drop into both eyes 2 (two) times daily.   ? venlafaxine XR (EFFEXOR-XR) 75 MG 24 hr capsule TAKE 1 CAPSULE BY MOUTH EVERY DAY  ? ?No facility-administered encounter medications on file as of 09/09/2021.  ? ? ?Allergies (verified) ?Amoxicillin-pot clavulanate, Augmentin [amoxicillin-pot clavulanate], and Nickel  ? ?History: ?Past Medical History:  ?Diagnosis Date  ? Complication of anesthesia   ? Depression   ? Glaucoma   ? Hemorrhoids   ? History of diverticulitis 09/2015  ? HOCM (hypertrophic obstructive cardiomyopathy) ([place])   ? Hyperlipidemia   ? Hypertension   ? PONV (postoperative nausea and vomiting)   ? Pulmonary embolus ([place]) 1971  ? following surgery  ? Vitamin D deficiency   ? ?Past Surgical History:  ?Procedure Laterality Date  ? ANKLE SURGERY    ? BACK SURGERY    ? NASAL SEPTUM SURGERY    ? 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 Procedure: RIGHT TOTAL KNEE ARTHROPLASTY;  Surgeon: Gaynelle Arabian, MD;  Location: [facility];  Service: Orthopedics;  Laterality: Right;  ? ?Family History  ?Problem Relation Age of Onset  ? Dementia Mother   ? Colon cancer Father 23  ? Heart failure Father   ? Allergic rhinitis Neg Hx   ? Asthma Neg Hx   ? Eczema Neg Hx   ? Urticaria Neg Hx   ? Angioedema Neg Hx   ? ?Social History  ? ?Socioeconomic History  ? Marital status: Single  ?  Spouse name: Not on file  ? Number of children: Not on file  ? Years of education: Not on file  ? Highest education level: Not on file  ?Occupational History  ? Not on file  ?Tobacco Use  ? Smoking status: Former  ?  Types: Cigarettes  ?  Quit date: 05/03/1980  ?  Years since quitting: 41.3  ? Smokeless tobacco: Never  ? Tobacco comments:  ?  a .50 a pack;   ?Vaping Use  ? Vaping Use:  Never used  ?Substance and Sexual Activity  ? Alcohol use: Yes  ?  Comment: rarely  ? Drug use: No  ? Sexual activity: Yes  ?Other Topics Concern  ? Not on file  ?Social History Narrative  ? Not on file  ? ?Social Determinants of Health  ? ?Financial Resource Strain: Low Risk   ? Difficulty of Paying Living Expenses: Not hard at all  ?Food Insecurity: No Food Insecurity  ? Worried About Charity fundraiser in the Last Year: Never true  ? Ran Out of Food in the Last Year: Never true  ?Transportation Needs: No Transportation Needs  ? Lack of Transportation (  Medical): No  ? Lack of Transportation (Non-Medical): No  ?Physical Activity: Sufficiently Active  ? Days of Exercise per Week: 5 days  ? Minutes of Exercise per Session: 60 min  ?Stress: No Stress Concern Present  ? Feeling of Stress : Not at all  ?Social Connections: Moderately Integrated  ? Frequency of Communication with Friends and Family: More than three times a week  ? Frequency of Social Gatherings with Friends and Family: More than three times a week  ? Attends Religious Services: More than 4 times per year  ? Active Member of Clubs or Organizations: Yes  ? Attends Archivist Meetings: More than 4 times per year  ? Marital Status: Never married  ? ? ? ?Clinical Intake: ? ? ?Diabetic?  No ? ? ?Activities of Daily Living ? ?  09/09/2021  ?  9:09 AM  ?In your present state of health, do you have any difficulty performing the following activities:  ?Hearing? 0  ?Vision? 0  ?Difficulty concentrating or making decisions? 0  ?Walking or climbing stairs? 0  ?Dressing or bathing? 0  ?Doing errands, shopping? 0  ?Preparing Food and eating ? N  ?Using the Toilet? N  ?In the past six months, have you accidently leaked urine? N  ?Do you have problems with loss of bowel control? N  ?Managing your Medications? N  ?Managing your Finances? N  ?Housekeeping or managing your Housekeeping? N  ? ? ?Patient Care Team: ?Billie Ruddy, MD as PCP - General (Family  Medicine) ?Gaynelle Arabian, MD as Consulting Physician (Orthopedic Surgery) ?Luberta Mutter, MD as Consulting Physician (Ophthalmology) ? ?Indicate any recent Medical Services you may have received from ot

## 2021-09-21 ENCOUNTER — Encounter: Payer: Self-pay | Admitting: Internal Medicine

## 2021-09-21 ENCOUNTER — Other Ambulatory Visit: Payer: Self-pay | Admitting: Family Medicine

## 2021-09-21 DIAGNOSIS — F339 Major depressive disorder, recurrent, unspecified: Secondary | ICD-10-CM

## 2021-10-01 ENCOUNTER — Ambulatory Visit (AMBULATORY_SURGERY_CENTER): Payer: PPO | Admitting: Internal Medicine

## 2021-10-01 ENCOUNTER — Encounter: Payer: Self-pay | Admitting: Internal Medicine

## 2021-10-01 VITALS — BP 146/56 | HR 55 | Temp 98.2°F | Resp 12 | Ht 66.0 in | Wt 234.0 lb

## 2021-10-01 DIAGNOSIS — K5901 Slow transit constipation: Secondary | ICD-10-CM

## 2021-10-01 DIAGNOSIS — D120 Benign neoplasm of cecum: Secondary | ICD-10-CM

## 2021-10-01 DIAGNOSIS — Z09 Encounter for follow-up examination after completed treatment for conditions other than malignant neoplasm: Secondary | ICD-10-CM

## 2021-10-01 DIAGNOSIS — Z86010 Personal history of colonic polyps: Secondary | ICD-10-CM

## 2021-10-01 DIAGNOSIS — K625 Hemorrhage of anus and rectum: Secondary | ICD-10-CM

## 2021-10-01 DIAGNOSIS — Z800 Family history of malignant neoplasm of digestive organs: Secondary | ICD-10-CM

## 2021-10-01 DIAGNOSIS — K602 Anal fissure, unspecified: Secondary | ICD-10-CM

## 2021-10-01 MED ORDER — SODIUM CHLORIDE 0.9 % IV SOLN: 500.0000 mL | Freq: Once | INTRAVENOUS | Status: DC

## 2021-10-01 NOTE — Progress Notes (Signed)
Sedate, gd SR, tolerated procedure well, VSS, report to RN 

## 2021-10-01 NOTE — Op Note (Signed)
[facility] Patient Name: Susan Jenkins Procedure Date: 10/01/2021 10:44 AM MRN: [id number] Endoscopist: Docia Chuck. [name] , MD Age: 76 Referring MD:  Date of Birth: 1946-04-26 Gender: Female Account #: [id number] Procedure:                Colonoscopy with cold snare polypectomy x 1 Indications:              High risk colon cancer surveillance: Personal                            history of adenoma (10 mm or greater in size).                            Previous examination elsewhere 2017. Father with a                            history of colon cancer greater than age 48. Last                            colonoscopy 2022 was compromised by poor prep Medicines:                Monitored Anesthesia Care Procedure:                Pre-Anesthesia Assessment:                           - Prior to the procedure, a History and Physical                            was performed, and patient medications and                            allergies were reviewed. The patient's tolerance of                            previous anesthesia was also reviewed. The risks                            and benefits of the procedure and the sedation                            options and risks were discussed with the patient.                            All questions were answered, and informed consent                            was obtained. Prior Anticoagulants: The patient has                            taken no previous anticoagulant or antiplatelet                            agents. ASA Grade Assessment: II - A patient with  mild systemic disease. After reviewing the risks                            and benefits, the patient was deemed in                            satisfactory condition to undergo the procedure.                           After obtaining informed consent, the colonoscope                            was passed under direct vision. Throughout the                             procedure, the patient's blood pressure, pulse, and                            oxygen saturations were monitored continuously. The                            Olympus CF-HQ190L [id number]) Colonoscope was                            introduced through the anus and advanced to the the                            cecum, identified by appendiceal orifice and                            ileocecal valve. The ileocecal valve, appendiceal                            orifice, and rectum were photographed. The quality                            of the bowel preparation was good. The colonoscopy                            was performed without difficulty. The patient                            tolerated the procedure well. The bowel preparation                            used was 2-day prep including SUPREP via split dose                            instruction. Scope In: 11:01:38 AM Scope Out: 11:20:00 AM Scope Withdrawal Time: 0 hours 12 minutes 6 seconds  Total Procedure Duration: 0 hours 18 minutes 22 seconds  Findings:                 A 4 mm polyp was found in the cecum. The polyp was  removed with a cold snare. Resection and retrieval                            were complete.                           Multiple diverticula were found in the sigmoid                            colon.                           The exam was otherwise without abnormality on                            direct and retroflexion views. Complications:            No immediate complications. Estimated blood loss:                            None. Estimated Blood Loss:     Estimated blood loss: none. Impression:               - One 4 mm polyp in the cecum, removed with a cold                            snare. Resected and retrieved.                           - Diverticulosis in the sigmoid colon.                           - The examination was otherwise normal on direct                             and retroflexion views. Recommendation:           - Repeat colonoscopy is not recommended for                            surveillance.                           - Patient has a contact number available for                            emergencies. The signs and symptoms of potential                            delayed complications were discussed with the                            patient. Return to normal activities tomorrow.                            Written discharge instructions were provided to the  patient.                           - Resume previous diet.                           - Continue present medications.                           - Await pathology results. Docia Chuck. [name], MD 10/01/2021 11:27:38 AM This report has been signed electronically.

## 2021-10-01 NOTE — Progress Notes (Signed)
Called to room to assist during endoscopic procedure.  Patient ID and intended procedure confirmed with present staff. Received instructions for my participation in the procedure from the performing physician.  

## 2021-10-01 NOTE — Patient Instructions (Addendum)
Handout on polyps and diverticulosis provided   Await pathology results.   Continue current medications.   YOU HAD AN ENDOSCOPIC PROCEDURE TODAY AT [facility]:   Refer to the procedure report that was given to you for any specific questions about what was found during the examination.  If the procedure report does not answer your questions, please call your gastroenterologist to clarify.  If you requested that your care partner not be given the details of your procedure findings, then the procedure report has been included in a sealed envelope for you to review at your convenience later.  YOU SHOULD EXPECT: Some feelings of bloating in the abdomen. Passage of more gas than usual.  Walking can help get rid of the air that was put into your GI tract during the procedure and reduce the bloating. If you had a lower endoscopy (such as a colonoscopy or flexible sigmoidoscopy) you may notice spotting of blood in your stool or on the toilet paper. If you underwent a bowel prep for your procedure, you may not have a normal bowel movement for a few days.  Please Note:  You might notice some irritation and congestion in your nose or some drainage.  This is from the oxygen used during your procedure.  There is no need for concern and it should clear up in a day or so.  SYMPTOMS TO REPORT IMMEDIATELY:  Following lower endoscopy (colonoscopy or flexible sigmoidoscopy):  Excessive amounts of blood in the stool  Significant tenderness or worsening of abdominal pains  Swelling of the abdomen that is new, acute  Fever of 100F or higher  For urgent or emergent issues, a gastroenterologist can be reached at any hour by calling [phone]. Do not use MyChart messaging for urgent concerns.    DIET:  We do recommend a small meal at first, but then you may proceed to your regular diet.  Drink plenty of fluids but you should avoid alcoholic beverages for 24 hours.  ACTIVITY:  You should plan  to take it easy for the rest of today and you should NOT DRIVE or use heavy machinery until tomorrow (because of the sedation medicines used during the test).    FOLLOW UP: Our staff will call the number listed on your records 48-72 hours following your procedure to check on you and address any questions or concerns that you may have regarding the information given to you following your procedure. If we do not reach you, we will leave a message.  We will attempt to reach you two times.  During this call, we will ask if you have developed any symptoms of COVID 19. If you develop any symptoms (ie: fever, flu-like symptoms, shortness of breath, cough etc.) before then, please call [phone].  If you test positive for Covid 19 in the 2 weeks post procedure, please call and report this information to [place].    If any biopsies were taken you will be contacted by phone or by letter within the next 1-3 weeks.  Please call us at [phone] if you have not heard about the biopsies in 3 weeks.    SIGNATURES/CONFIDENTIALITY: You and/or your care partner have signed paperwork which will be entered into your electronic medical record.  These signatures attest to the fact that that the information above on your After Visit Summary has been reviewed and is understood.  Full responsibility of the confidentiality of this discharge information lies with you and/or your care-partner.

## 2021-10-01 NOTE — Progress Notes (Signed)
HISTORY OF PRESENT ILLNESS:   Susan Jenkins is a 76 y.o. female with past medical history as listed below.  She also has a history of adenomatous colon polyps and a family history of colon cancer (father around age 73).  Previous colonoscopy elsewhere in 2017.  I saw the patient in the office January 2022 with rectal bleeding and rectal pain.  She was diagnosed with an anal fissure.  She was treated with diltiazem.  She subsequently underwent routine surveillance colonoscopy July 02, 2020.  Unfortunately, the preparation was poor and the exam incomplete.  A diminutive adenoma was removed time.  She presents today to schedule her colonoscopy with adjusted prep.  She does tell me that she continues with chronic constipation for which she takes Colace and Metamucil.  She does notice that with constipated bowel movement she will have difficulties with her fissure.  Diltiazem did help in the past.  She does request refill of the medication.  No additional GI complaints.  Overall health otherwise stable   REVIEW OF SYSTEMS:   All non-GI ROS negative unless otherwise stated in the HPI except for arthritis       Past Medical History:  Diagnosis Date   Complication of anesthesia     Depression     Glaucoma     Hemorrhoids     History of diverticulitis 09/2015   HOCM (hypertrophic obstructive cardiomyopathy) ([place])     Hyperlipidemia     Hypertension     PONV (postoperative nausea and vomiting)     Pulmonary embolus ([place]) 1971    following surgery   Vitamin D deficiency             Past Surgical History:  Procedure Laterality Date   ANKLE SURGERY       BACK SURGERY       NASAL SEPTUM SURGERY       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 Her smoking use  included cigarettes. She has never used smokeless tobacco. She reports current alcohol use. She reports that she does not use drugs.   family history includes Colon cancer (age of onset: 30) in her father; Dementia in her mother; Heart failure in her father.        Allergies  Allergen Reactions   Amoxicillin-Pot Clavulanate Anaphylaxis      Has patient had a PCN reaction causing immediate rash, facial/tongue/throat swelling, SOB or lightheadedness with hypotension: Yes Has patient had a PCN reaction causing severe rash involving mucus membranes or skin necrosis: Yes Has patient had a PCN reaction that required hospitalization Yes Has patient had a PCN reaction occurring within the last 10 years: No If all of the above answers are "NO", then may proceed with Cephalosporin use.   Augmentin [Amoxicillin-Pot Clavulanate]     Nickel Rash          PHYSICAL EXAMINATION: Vital signs: BP (!) 138/58   Pulse (!) 52   Ht 5' 6.5" (1.689 m)   Wt 234 lb (106.1 kg)   SpO2 97%   BMI 37.20 kg/m   Constitutional: generally well-appearing, no acute distress Psychiatric: alert and oriented x3, cooperative Eyes: extraocular movements intact, anicteric, conjunctiva pink Mouth: oral pharynx moist, no lesions Neck: supple no lymphadenopathy Cardiovascular: heart  regular rate and rhythm, no murmur Lungs: clear to auscultation bilaterally Abdomen: soft, nontender, nondistended, no obvious ascites, no peritoneal signs, normal bowel sounds, no organomegaly Rectal: Deferred until colonoscopy Extremities: no clubbing, cyanosis, or lower extremity edema bilaterally Skin: no lesions on visible extremities Neuro: No focal deficits.  Cranial nerves intact   ASSESSMENT:   1.  Chronic constipation 2.  Anal fissure 3.  History of adenomatous colon polyps and family history of colon cancer.  Due for surveillance 4.  Colonoscopy 2022 aborted due to poor prep     PLAN:   1.  Discussed regular MiraLAX use  for constipation.  The proper way to use the medication and medication risks reviewed. 2.  Refill diltiazem 2% ointment for anal fissure.  Proper way to use the medication and medication risks reviewed. 3.  Schedule surveillance colonoscopy. The nature of the procedure, as well as the risks, benefits, and alternatives were carefully and thoroughly reviewed with the patient. Ample time for discussion and questions allowed. The patient understood, was satisfied, and agreed to proceed. 4.  The patient will require 2-day prep with 2 days of clear liquids, magnesium citrate (or MiraLAX) in the morning the day prior to the procedure followed by split Suprep (not tablets).  Also, we will prescribe Zofran 8 mg.  Dispense 2.  Take 120 minutes before each prep session.  Use hard candy for nausea as well.

## 2021-10-02 ENCOUNTER — Telehealth: Payer: Self-pay | Admitting: *Deleted

## 2021-10-02 NOTE — Telephone Encounter (Signed)
  Follow up Call-     10/01/2021   10:06 AM 07/02/2020   10:58 AM  Call back number  Post procedure Call Back phone  # [phone]  Permission to leave phone message Yes Yes     Patient questions:  Do you have a fever, pain , or abdominal swelling? No. Pain Score  0 *  Have you tolerated food without any problems? Yes.    Have you been able to return to your normal activities? Yes.    Do you have any questions about your discharge instructions: Diet   No. Medications  No. Follow up visit  No.  Do you have questions or concerns about your Care? No.  Actions: * If pain score is 4 or above: No action needed, pain <4.

## 2021-10-05 ENCOUNTER — Encounter: Payer: Self-pay | Admitting: Internal Medicine

## 2022-01-26 ENCOUNTER — Other Ambulatory Visit: Payer: Self-pay | Admitting: Family Medicine

## 2022-01-26 DIAGNOSIS — E785 Hyperlipidemia, unspecified: Secondary | ICD-10-CM

## 2022-03-01 ENCOUNTER — Other Ambulatory Visit: Payer: Self-pay | Admitting: Family Medicine

## 2022-03-01 DIAGNOSIS — F339 Major depressive disorder, recurrent, unspecified: Secondary | ICD-10-CM

## 2022-05-18 DIAGNOSIS — M1712 Unilateral primary osteoarthritis, left knee: Secondary | ICD-10-CM | POA: Diagnosis not present

## 2022-05-26 ENCOUNTER — Other Ambulatory Visit: Payer: Self-pay | Admitting: Family Medicine

## 2022-05-26 DIAGNOSIS — F339 Major depressive disorder, recurrent, unspecified: Secondary | ICD-10-CM

## 2022-05-30 IMAGING — CT CT ABD-PELV W/ CM
1 of 3 series · 13 of 32 positions shown, 19 images · IV contrast (APPLIED)
Comparison: 10/03/2015 from [HOSPITAL]

CLINICAL DATA: Left lower quadrant pain.  Melena.

Creatinine was obtained on site at [HOSPITAL] at [HOSPITAL].
Results: Creatinine 0.9 mg/dL.
EXAM:
CT ABDOMEN AND PELVIS WITH CONTRAST
TECHNIQUE: Multidetector CT imaging of the abdomen and pelvis was performed
using the standard protocol following bolus administration of
intravenous contrast.
CONTRAST:  100mL [id number] IOPAMIDOL ([id number]) INJECTION 61%

[Series 2: abd/pelvis w/cm · axial · 0.82mm/px · z∈[-693,-313]mm · 13 of 90 slices shown, 19 images]
[im 7/90  soft-tissue]
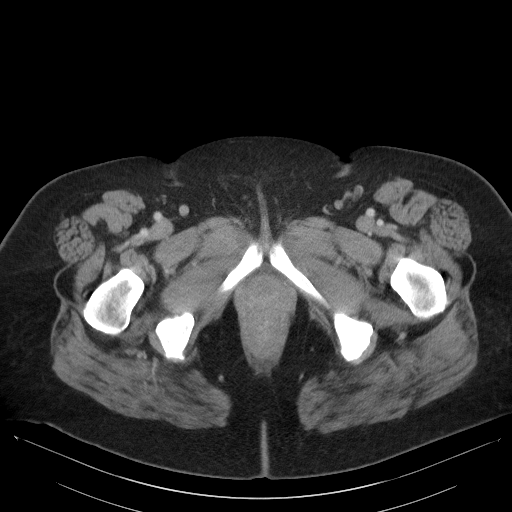
[im 7/90  bone]
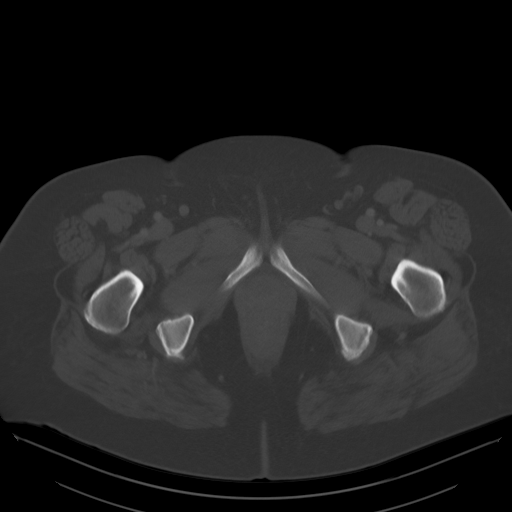
[im 13/90  soft-tissue]
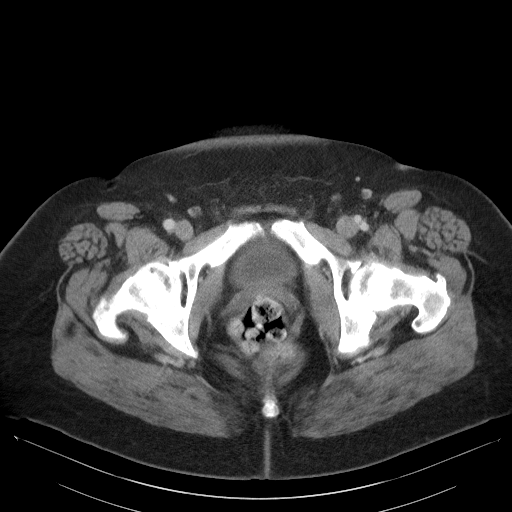
[im 20/90  soft-tissue]
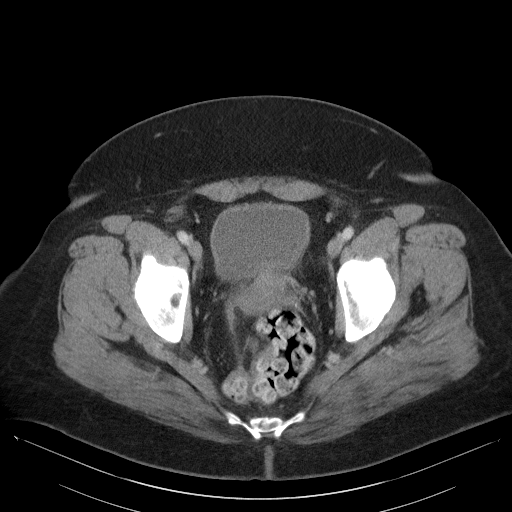
[im 26/90  soft-tissue]
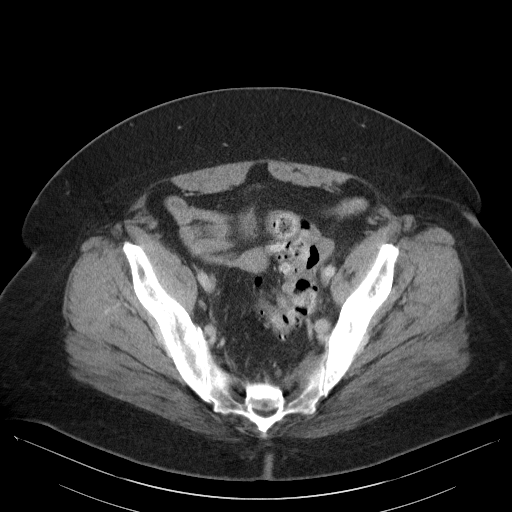
[im 32/90  soft-tissue]
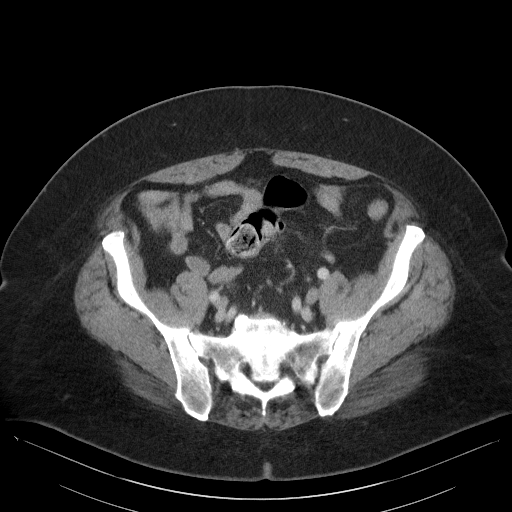
[im 39/90  soft-tissue]
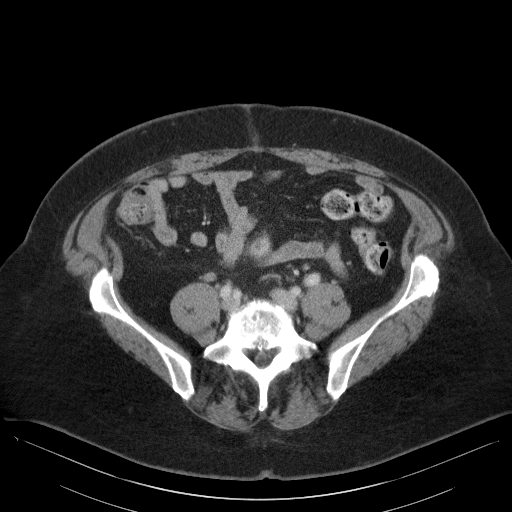
[im 45/90  soft-tissue]
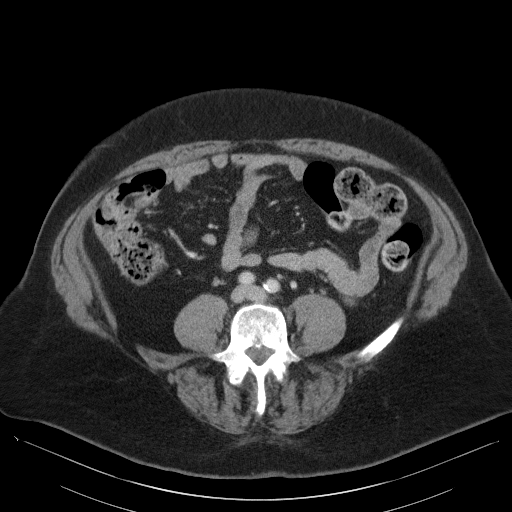
[im 51/90  soft-tissue]
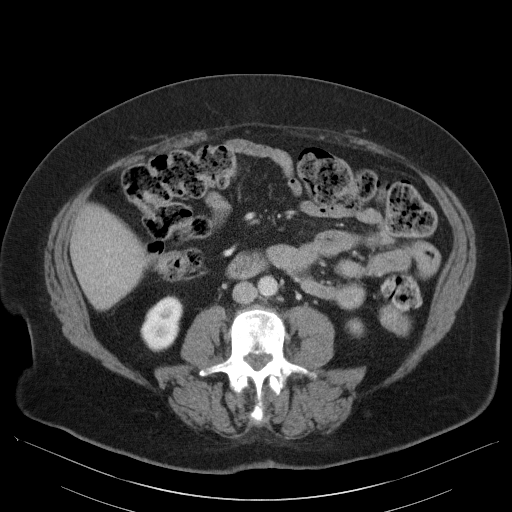
[im 58/90  soft-tissue]
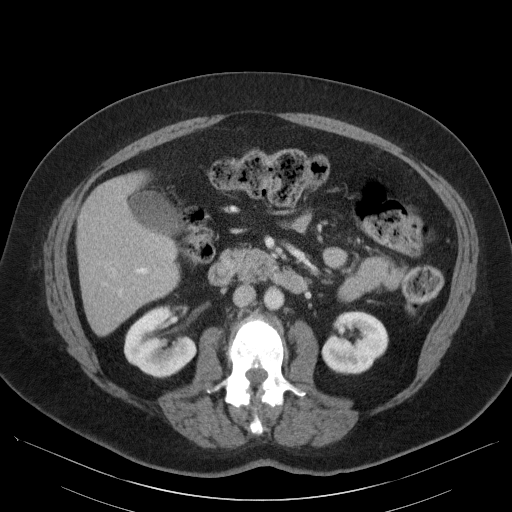
[im 58/90  bone]
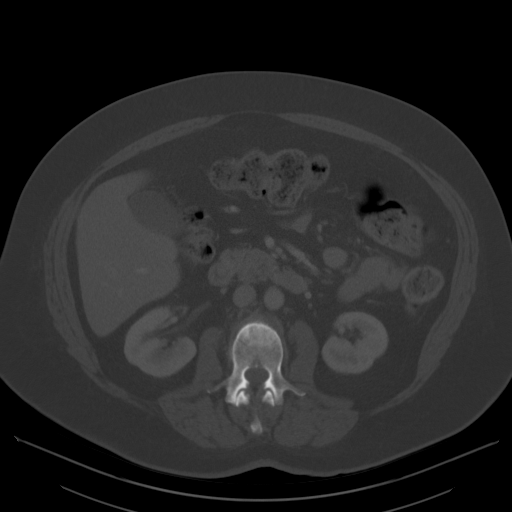
[im 64/90  soft-tissue]
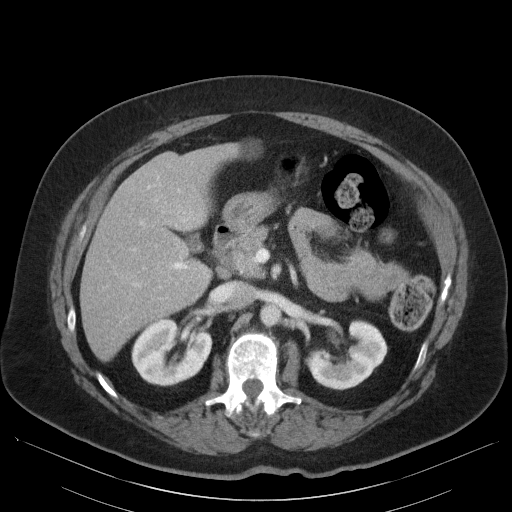
[im 64/90  lung]
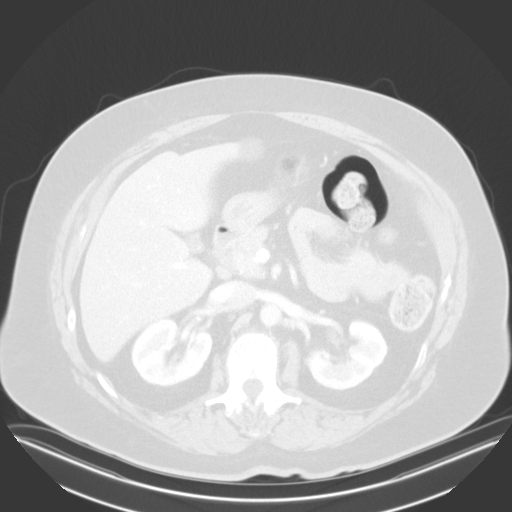
[im 70/90  soft-tissue]
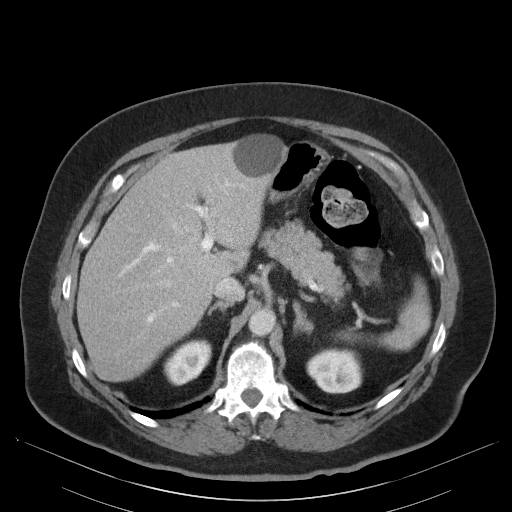
[im 70/90  lung]
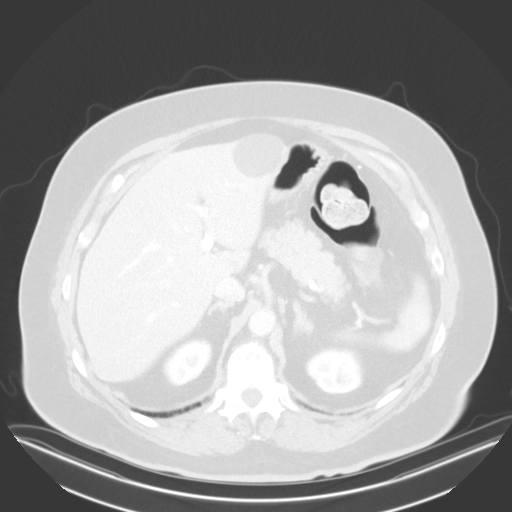
[im 77/90  soft-tissue]
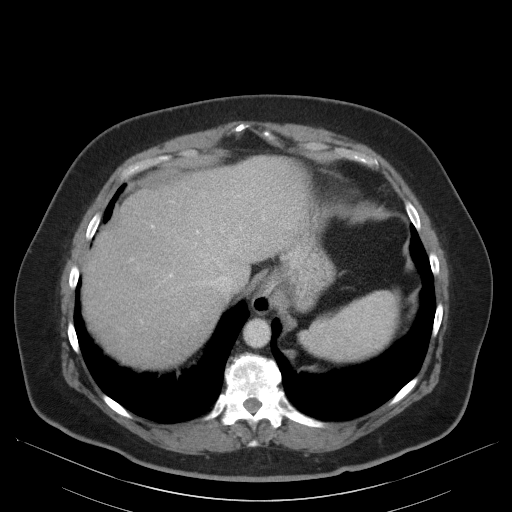
[im 77/90  lung]
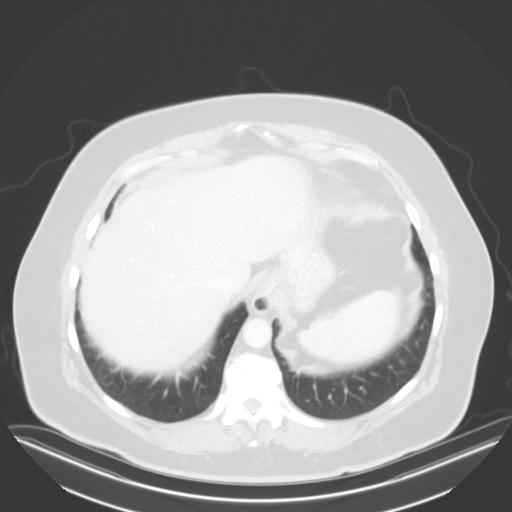
[im 83/90  soft-tissue]
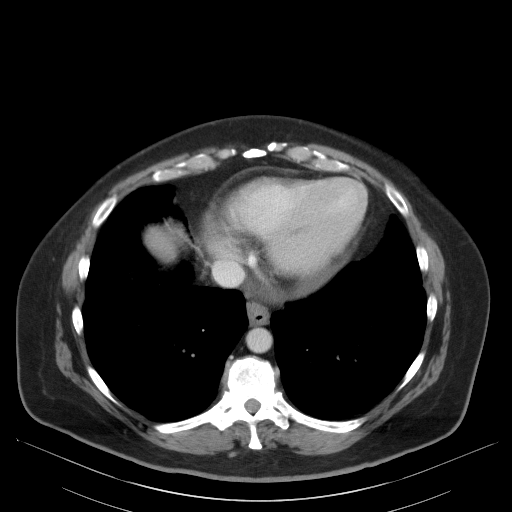
[im 83/90  lung]
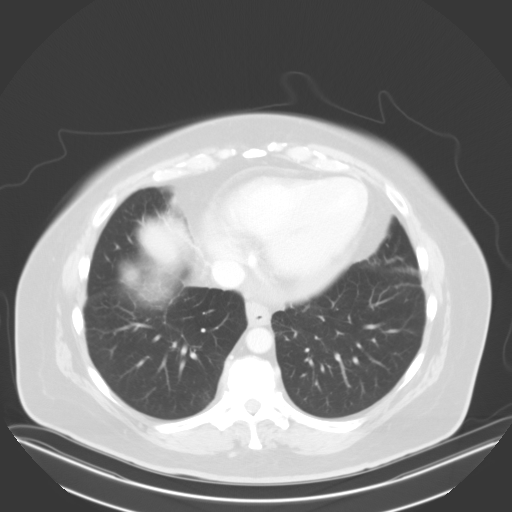

[13 of 32 positions shown; findings below may reference images not displayed]

FINDINGS: Lower Chest: No acute findings.

Hepatobiliary: No hepatic masses identified. Two small cysts in the
left hepatic lobe remains stable. Gallbladder is unremarkable. No
evidence of biliary ductal dilatation.

Pancreas:  No mass or inflammatory changes.

Spleen: Within normal limits in size and appearance.

Adrenals/Urinary Tract: No masses identified. No evidence of
ureteral calculi or hydronephrosis.

Stomach/Bowel: Tiny hiatal hernia noted. No evidence of obstruction,
inflammatory process or abnormal fluid collections. Diverticulosis
is seen mainly involving the sigmoid colon, however there is no
evidence of diverticulitis.

Vascular/Lymphatic: No pathologically enlarged lymph nodes. No
abdominal aortic aneurysm. Aortic atherosclerosis noted.

Reproductive:  No mass or other significant abnormality.

Other:  None.

Musculoskeletal:  No suspicious bone lesions identified.
IMPRESSION: Sigmoid diverticulosis, without radiographic evidence of
diverticulitis or other acute findings.

Tiny hiatal hernia.

Aortic Atherosclerosis ([id number]-QM0.0).

## 2022-06-10 DIAGNOSIS — M542 Cervicalgia: Secondary | ICD-10-CM | POA: Diagnosis not present

## 2022-06-10 DIAGNOSIS — M9901 Segmental and somatic dysfunction of cervical region: Secondary | ICD-10-CM | POA: Diagnosis not present

## 2022-06-10 DIAGNOSIS — M9903 Segmental and somatic dysfunction of lumbar region: Secondary | ICD-10-CM | POA: Diagnosis not present

## 2022-06-10 DIAGNOSIS — M62830 Muscle spasm of back: Secondary | ICD-10-CM | POA: Diagnosis not present

## 2022-06-16 ENCOUNTER — Other Ambulatory Visit: Payer: Self-pay | Admitting: Family Medicine

## 2022-06-16 DIAGNOSIS — I10 Essential (primary) hypertension: Secondary | ICD-10-CM

## 2022-06-23 ENCOUNTER — Ambulatory Visit (INDEPENDENT_AMBULATORY_CARE_PROVIDER_SITE_OTHER): Payer: PPO | Admitting: Family Medicine

## 2022-06-23 VITALS — BP 130/68 | HR 53 | Temp 98.9°F | Ht 66.0 in | Wt 238.0 lb

## 2022-06-23 DIAGNOSIS — I10 Essential (primary) hypertension: Secondary | ICD-10-CM

## 2022-06-23 DIAGNOSIS — H65191 Other acute nonsuppurative otitis media, right ear: Secondary | ICD-10-CM

## 2022-06-23 DIAGNOSIS — J302 Other seasonal allergic rhinitis: Secondary | ICD-10-CM | POA: Diagnosis not present

## 2022-06-23 DIAGNOSIS — H409 Unspecified glaucoma: Secondary | ICD-10-CM

## 2022-06-23 DIAGNOSIS — H9191 Unspecified hearing loss, right ear: Secondary | ICD-10-CM

## 2022-06-23 DIAGNOSIS — L821 Other seborrheic keratosis: Secondary | ICD-10-CM

## 2022-06-23 DIAGNOSIS — M65351 Trigger finger, right little finger: Secondary | ICD-10-CM | POA: Diagnosis not present

## 2022-06-23 DIAGNOSIS — M65342 Trigger finger, left ring finger: Secondary | ICD-10-CM | POA: Diagnosis not present

## 2022-06-23 DIAGNOSIS — M65341 Trigger finger, right ring finger: Secondary | ICD-10-CM

## 2022-06-23 DIAGNOSIS — L570 Actinic keratosis: Secondary | ICD-10-CM | POA: Diagnosis not present

## 2022-06-23 DIAGNOSIS — E785 Hyperlipidemia, unspecified: Secondary | ICD-10-CM | POA: Diagnosis not present

## 2022-06-23 DIAGNOSIS — R7303 Prediabetes: Secondary | ICD-10-CM

## 2022-06-23 DIAGNOSIS — Z0001 Encounter for general adult medical examination with abnormal findings: Secondary | ICD-10-CM

## 2022-06-23 MED ORDER — AMLODIPINE BESYLATE 10 MG PO TABS: 10.0000 mg | ORAL_TABLET | Freq: Every day | ORAL | 3 refills | Status: DC

## 2022-06-23 MED ORDER — ROSUVASTATIN CALCIUM 10 MG PO TABS: 10.0000 mg | ORAL_TABLET | Freq: Every day | ORAL | 3 refills | Status: DC

## 2022-06-23 NOTE — Patient Instructions (Signed)
Try a different allergy medication to see if you notice improvement in the your hearing.  If you would like a referral to have the steroid injection  for your fingers, let us know.

## 2022-06-23 NOTE — Progress Notes (Signed)
Established Patient Office Visit   Subjective  Patient ID: Susan Jenkins, female    DOB: 1945-12-01  Age: 77 y.o. MRN: [id number]  Chief Complaint  Patient presents with   Medical Management of Chronic Issues   Medication Management   Annual Exam    Patient is a 77 year old female who presents today for CPE and follow-up.  Patient states she is doing well overall.  Needs refills on BP meds.  Endorses occasional dizzy sensation.  At times occurs while at home if rounds the corner too quickly.  Patient staying active playing pickle ball, walking, and doing water aerobics several times per week.  Patient planning to [place] in the next few months.  Patient has an appointment with dermatology tomorrow for area of dry skin on left temple.  States does not always wear sunscreen.  Patient notes fingers of bilateral hands getting stuck left ring finger worse than right ring finger and right fifth digit.  Patient's chiropractor feels like it is an issue with her wrist.  Patient endorses decreased hearing in right ear.      ROS Negative unless stated above    Objective:     BP 130/68   Pulse (!) 53   Temp 98.9 F (37.2 C) (Oral)   Ht 5' 6"$  (1.676 m)   Wt 238 lb (108 kg)   SpO2 99%   BMI 38.41 kg/m    Physical Exam Constitutional:      Appearance: Normal appearance.  HENT:     Head: Normocephalic and atraumatic.     Right Ear: Tympanic membrane, ear canal and external ear normal.     Left Ear: Tympanic membrane, ear canal and external ear normal.     Nose: Nose normal.     Mouth/Throat:     Mouth: Mucous membranes are moist.     Pharynx: No oropharyngeal exudate or posterior oropharyngeal erythema.  Eyes:     General: No scleral icterus.    Extraocular Movements: Extraocular movements intact.     Conjunctiva/sclera: Conjunctivae normal.     Pupils: Pupils are equal, round, and reactive to light.  Neck:     Thyroid: No thyromegaly.  Cardiovascular:     Rate and  Rhythm: Normal rate and regular rhythm.     Pulses: Normal pulses.     Heart sounds: Normal heart sounds. No murmur heard.    No friction rub.  Pulmonary:     Effort: Pulmonary effort is normal.     Breath sounds: Normal breath sounds. No wheezing, rhonchi or rales.  Abdominal:     General: Bowel sounds are normal.     Palpations: Abdomen is soft.     Tenderness: There is no abdominal tenderness.       Comments: Palpable, mobile nodule 60m.  TTP.  Musculoskeletal:        General: No deformity. Normal range of motion.  Lymphadenopathy:     Cervical: No cervical adenopathy.  Skin:    General: Skin is warm and dry.     Findings: No lesion.          Comments: AK of L temple.  SK of r neck.  Neurological:     General: No focal deficit present.     Mental Status: She is alert and oriented to person, place, and time.  Psychiatric:        Mood and Affect: Mood normal.        Thought Content: Thought content normal.  No results found for any visits on 06/23/22.    Assessment & Plan:  Encounter for well adult exam with abnormal findings -Anticipatory guidance given including wearing seatbelts, smoke detectors in the home, increasing physical activity, increasing p.o. intake of water and vegetables. -Labs -Immunizations reviewed and up-to-date -Given handout -Next CPE in 1 year  Essential hypertension -     amLODIPine Besylate; Take 1 tablet (10 mg total) by mouth daily.  Dispense: 90 tablet; Refill: 3 -     Comprehensive metabolic panel -     CBC with Differential/Platelet -     TSH  Hyperlipidemia, unspecified hyperlipidemia type -     Rosuvastatin Calcium; Take 1 tablet (10 mg total) by mouth daily.  Dispense: 90 tablet; Refill: 3 -     Lipid panel  Trigger ring finger of left hand -Given info on trigger finger. -Discussed referral to Ortho or hand surgery for steroid injection.  For continued symptoms consider trigger release.  Patient wishes to wait on these  options.  Trigger ring finger of right hand  Trigger little finger of right hand  Glaucoma of both eyes, unspecified glaucoma type -Continue follow-up with ophthalmology. -Continue timolol eyedrops  Seasonal allergies -Patient advised to switch to a different allergy medication to see if she notices improvement in hearing and bilateral TMs full on exam. -     CBC with Differential/Platelet  Prediabetes -     Hemoglobin A1c  Decreased hearing of right ear -Left ear normal -Right ear only heard 500 mHz -Possibly 2/2 effusion of ear from allergy symptoms. -Will have patient switch OTC allergy medications.  For continued or worsening symptoms refer to audiology.  Acute effusion of right ear -Antihistamine  Actinic keratosis -Area on left temple -Continue follow-up with dermatology  Seborrheic keratosis -Continue follow-up with dermatology -Area on right neck and left cheek   Return in about 4 months (around 10/22/2022).   Billie Ruddy, MD

## 2022-06-24 DIAGNOSIS — L570 Actinic keratosis: Secondary | ICD-10-CM | POA: Diagnosis not present

## 2022-06-24 DIAGNOSIS — L820 Inflamed seborrheic keratosis: Secondary | ICD-10-CM | POA: Diagnosis not present

## 2022-06-24 LAB — COMPREHENSIVE METABOLIC PANEL
ALT: 22 U/L (ref 0–35)
AST: 26 U/L (ref 0–37)
Albumin: 4.7 g/dL (ref 3.5–5.2)
Alkaline Phosphatase: 47 U/L (ref 39–117)
BUN: 15 mg/dL (ref 6–23)
CO2: 30 mEq/L (ref 19–32)
Calcium: 10.2 mg/dL (ref 8.4–10.5)
Chloride: 95 mEq/L — ABNORMAL LOW (ref 96–112)
Creatinine, Ser: 0.77 mg/dL (ref 0.40–1.20)
GFR: 76.06 mL/min (ref >60.00)
Glucose, Bld: 86 mg/dL (ref 70–99)
Potassium: 3.6 mEq/L (ref 3.5–5.1)
Sodium: 136 mEq/L (ref 135–145)
Total Bilirubin: 0.3 mg/dL (ref 0.2–1.2)
Total Protein: 7.2 g/dL (ref 6.0–8.3)

## 2022-06-24 LAB — CBC WITH DIFFERENTIAL/PLATELET
Basophils Absolute: 0.1 10*3/uL (ref 0.0–0.1)
Basophils Relative: 0.8 % (ref 0.0–3.0)
Eosinophils Absolute: 0.1 10*3/uL (ref 0.0–0.7)
Eosinophils Relative: 1.7 % (ref 0.0–5.0)
HCT: 39.3 % (ref 36.0–46.0)
Hemoglobin: 13.6 g/dL (ref 12.0–15.0)
Lymphocytes Relative: 23.7 % (ref 12.0–46.0)
Lymphs Abs: 1.9 10*3/uL (ref 0.7–4.0)
MCHC: 34.5 g/dL (ref 30.0–36.0)
MCV: 100.4 fl — ABNORMAL HIGH (ref 78.0–100.0)
Monocytes Absolute: 0.8 10*3/uL (ref 0.1–1.0)
Monocytes Relative: 10 % (ref 3.0–12.0)
Neutro Abs: 5.1 10*3/uL (ref 1.4–7.7)
Neutrophils Relative %: 63.8 % (ref 43.0–77.0)
Platelets: 278 10*3/uL (ref 150.0–400.0)
RBC: 3.92 Mil/uL (ref 3.87–5.11)
RDW: 12.7 % (ref 11.5–15.5)
WBC: 8 10*3/uL (ref 4.0–10.5)

## 2022-06-24 LAB — LIPID PANEL
Cholesterol: 195 mg/dL (ref 0–200)
HDL: 78.4 mg/dL (ref >39.00)
LDL Cholesterol: 80 mg/dL (ref 0–99)
NonHDL: 116.59
Total CHOL/HDL Ratio: 2
Triglycerides: 183 mg/dL — ABNORMAL HIGH (ref 0.0–149.0)
VLDL: 36.6 mg/dL (ref 0.0–40.0)

## 2022-06-24 LAB — HEMOGLOBIN A1C: Hgb A1c MFr Bld: 6.2 % (ref 4.6–6.5)

## 2022-06-24 LAB — TSH: TSH: 2.28 u[IU]/mL (ref 0.35–5.50)

## 2022-07-15 ENCOUNTER — Other Ambulatory Visit: Payer: Self-pay | Admitting: Family Medicine

## 2022-07-15 DIAGNOSIS — I10 Essential (primary) hypertension: Secondary | ICD-10-CM

## 2022-07-15 DIAGNOSIS — M9903 Segmental and somatic dysfunction of lumbar region: Secondary | ICD-10-CM | POA: Diagnosis not present

## 2022-07-15 DIAGNOSIS — M9901 Segmental and somatic dysfunction of cervical region: Secondary | ICD-10-CM | POA: Diagnosis not present

## 2022-07-15 DIAGNOSIS — M542 Cervicalgia: Secondary | ICD-10-CM | POA: Diagnosis not present

## 2022-07-15 DIAGNOSIS — M62830 Muscle spasm of back: Secondary | ICD-10-CM | POA: Diagnosis not present

## 2022-07-22 DIAGNOSIS — M1712 Unilateral primary osteoarthritis, left knee: Secondary | ICD-10-CM | POA: Diagnosis not present

## 2022-08-12 DIAGNOSIS — M62830 Muscle spasm of back: Secondary | ICD-10-CM | POA: Diagnosis not present

## 2022-08-12 DIAGNOSIS — M9901 Segmental and somatic dysfunction of cervical region: Secondary | ICD-10-CM | POA: Diagnosis not present

## 2022-08-12 DIAGNOSIS — M9903 Segmental and somatic dysfunction of lumbar region: Secondary | ICD-10-CM | POA: Diagnosis not present

## 2022-08-12 DIAGNOSIS — M542 Cervicalgia: Secondary | ICD-10-CM | POA: Diagnosis not present

## 2022-08-22 ENCOUNTER — Other Ambulatory Visit: Payer: Self-pay | Admitting: Family Medicine

## 2022-08-22 DIAGNOSIS — F339 Major depressive disorder, recurrent, unspecified: Secondary | ICD-10-CM

## 2022-09-02 DIAGNOSIS — Z1231 Encounter for screening mammogram for malignant neoplasm of breast: Secondary | ICD-10-CM | POA: Diagnosis not present

## 2022-09-02 LAB — HM MAMMOGRAPHY

## 2022-09-09 DIAGNOSIS — M9901 Segmental and somatic dysfunction of cervical region: Secondary | ICD-10-CM | POA: Diagnosis not present

## 2022-09-09 DIAGNOSIS — M542 Cervicalgia: Secondary | ICD-10-CM | POA: Diagnosis not present

## 2022-09-09 DIAGNOSIS — M62830 Muscle spasm of back: Secondary | ICD-10-CM | POA: Diagnosis not present

## 2022-09-09 DIAGNOSIS — M9903 Segmental and somatic dysfunction of lumbar region: Secondary | ICD-10-CM | POA: Diagnosis not present

## 2022-09-20 ENCOUNTER — Ambulatory Visit (INDEPENDENT_AMBULATORY_CARE_PROVIDER_SITE_OTHER): Payer: PPO

## 2022-09-20 VITALS — Ht 66.0 in | Wt 238.0 lb

## 2022-09-20 DIAGNOSIS — Z0000 Encounter for general adult medical examination without abnormal findings: Secondary | ICD-10-CM | POA: Diagnosis not present

## 2022-09-20 NOTE — Patient Instructions (Addendum)
Susan Jenkins , Thank you for taking time to come for your Medicare Wellness Visit. I appreciate your ongoing commitment to your health goals. Please review the following plan we discussed and let me know if I can assist you in the future.   These are the goals we discussed:  Goals       Patient Stated (pt-stated)      I Will continue to play sports and remain socially active.        This is a list of the screening recommended for you and due dates:  Health Maintenance  Topic Date Due   COVID-19 Vaccine (6 - 2023-24 season) 10/06/2022*   Hepatitis C Screening: USPSTF Recommendation to screen - Ages 18-79 yo.  09/20/2023*   Colon Cancer Screening  10/02/2022   Flu Shot  12/02/2022   Mammogram  09/02/2023   Medicare Annual Wellness Visit  09/20/2023   DTaP/Tdap/Td vaccine (3 - Td or Tdap) 07/05/2027   Pneumonia Vaccine  Completed   DEXA scan (bone density measurement)  Completed   Zoster (Shingles) Vaccine  Completed   HPV Vaccine  Aged Out  *Topic was postponed. The date shown is not the original due date.    Advanced directives: Please bring a copy of your health care power of attorney and living will to the office to be added to your chart at your convenience.   Conditions/risks identified: None  Next appointment: Follow up in one year for your annual wellness visit    Preventive Care 65 Years and Older, Female Preventive care refers to lifestyle choices and visits with your health care provider that can promote health and wellness. What does preventive care include? A yearly physical exam. This is also called an annual well check. Dental exams once or twice a year. Routine eye exams. Ask your health care provider how often you should have your eyes checked. Personal lifestyle choices, including: Daily care of your teeth and gums. Regular physical activity. Eating a healthy diet. Avoiding tobacco and drug use. Limiting alcohol use. Practicing safe sex. Taking low-dose  aspirin every day. Taking vitamin and mineral supplements as recommended by your health care provider. What happens during an annual well check? The services and screenings done by your health care provider during your annual well check will depend on your age, overall health, lifestyle risk factors, and family history of disease. Counseling  Your health care provider may ask you questions about your: Alcohol use. Tobacco use. Drug use. Emotional well-being. Home and relationship well-being. Sexual activity. Eating habits. History of falls. Memory and ability to understand (cognition). Work and work Astronomer. Reproductive health. Screening  You may have the following tests or measurements: Height, weight, and BMI. Blood pressure. Lipid and cholesterol levels. These may be checked every 5 years, or more frequently if you are over 34 years old. Skin check. Lung cancer screening. You may have this screening every year starting at age 49 if you have a 30-pack-year history of smoking and currently smoke or have quit within the past 15 years. Fecal occult blood test (FOBT) of the stool. You may have this test every year starting at age 33. Flexible sigmoidoscopy or colonoscopy. You may have a sigmoidoscopy every 5 years or a colonoscopy every 10 years starting at age 59. Hepatitis C blood test. Hepatitis B blood test. Sexually transmitted disease (STD) testing. Diabetes screening. This is done by checking your blood sugar (glucose) after you have not eaten for a while (fasting). You may have  this done every 1-3 years. Bone density scan. This is done to screen for osteoporosis. You may have this done starting at age 44. Mammogram. This may be done every 1-2 years. Talk to your health care provider about how often you should have regular mammograms. Talk with your health care provider about your test results, treatment options, and if necessary, the need for more tests. Vaccines  Your  health care provider may recommend certain vaccines, such as: Influenza vaccine. This is recommended every year. Tetanus, diphtheria, and acellular pertussis (Tdap, Td) vaccine. You may need a Td booster every 10 years. Zoster vaccine. You may need this after age 51. Pneumococcal 13-valent conjugate (PCV13) vaccine. One dose is recommended after age 45. Pneumococcal polysaccharide (PPSV23) vaccine. One dose is recommended after age 70. Talk to your health care provider about which screenings and vaccines you need and how often you need them. This information is not intended to replace advice given to you by your health care provider. Make sure you discuss any questions you have with your health care provider. Document Released: 05/16/2015 Document Revised: 01/07/2016 Document Reviewed: 02/18/2015 Elsevier Interactive Patient Education  2017 [employer].  Fall Prevention in the Home Falls can cause injuries. They can happen to people of all ages. There are many things you can do to make your home safe and to help prevent falls. What can I do on the outside of my home? Regularly fix the edges of walkways and driveways and fix any cracks. Remove anything that might make you trip as you walk through a door, such as a raised step or threshold. Trim any bushes or trees on the path to your home. Use bright outdoor lighting. Clear any walking paths of anything that might make someone trip, such as rocks or tools. Regularly check to see if handrails are loose or broken. Make sure that both sides of any steps have handrails. Any raised decks and porches should have guardrails on the edges. Have any leaves, snow, or ice cleared regularly. Use sand or salt on walking paths during winter. Clean up any spills in your garage right away. This includes oil or grease spills. What can I do in the bathroom? Use night lights. Install grab bars by the toilet and in the tub and shower. Do not use towel bars as  grab bars. Use non-skid mats or decals in the tub or shower. If you need to sit down in the shower, use a plastic, non-slip stool. Keep the floor dry. Clean up any water that spills on the floor as soon as it happens. Remove soap buildup in the tub or shower regularly. Attach bath mats securely with double-sided non-slip rug tape. Do not have throw rugs and other things on the floor that can make you trip. What can I do in the bedroom? Use night lights. Make sure that you have a light by your bed that is easy to reach. Do not use any sheets or blankets that are too big for your bed. They should not hang down onto the floor. Have a firm chair that has side arms. You can use this for support while you get dressed. Do not have throw rugs and other things on the floor that can make you trip. What can I do in the kitchen? Clean up any spills right away. Avoid walking on wet floors. Keep items that you use a lot in easy-to-reach places. If you need to reach something above you, use a strong step stool  that has a grab bar. Keep electrical cords out of the way. Do not use floor polish or wax that makes floors slippery. If you must use wax, use non-skid floor wax. Do not have throw rugs and other things on the floor that can make you trip. What can I do with my stairs? Do not leave any items on the stairs. Make sure that there are handrails on both sides of the stairs and use them. Fix handrails that are broken or loose. Make sure that handrails are as long as the stairways. Check any carpeting to make sure that it is firmly attached to the stairs. Fix any carpet that is loose or worn. Avoid having throw rugs at the top or bottom of the stairs. If you do have throw rugs, attach them to the floor with carpet tape. Make sure that you have a light switch at the top of the stairs and the bottom of the stairs. If you do not have them, ask someone to add them for you. What else can I do to help prevent  falls? Wear shoes that: Do not have high heels. Have rubber bottoms. Are comfortable and fit you well. Are closed at the toe. Do not wear sandals. If you use a stepladder: Make sure that it is fully opened. Do not climb a closed stepladder. Make sure that both sides of the stepladder are locked into place. Ask someone to hold it for you, if possible. Clearly mark and make sure that you can see: Any grab bars or handrails. First and last steps. Where the edge of each step is. Use tools that help you move around (mobility aids) if they are needed. These include: Canes. Walkers. Scooters. Crutches. Turn on the lights when you go into a dark area. Replace any light bulbs as soon as they burn out. Set up your furniture so you have a clear path. Avoid moving your furniture around. If any of your floors are uneven, fix them. If there are any pets around you, be aware of where they are. Review your medicines with your doctor. Some medicines can make you feel dizzy. This can increase your chance of falling. Ask your doctor what other things that you can do to help prevent falls. This information is not intended to replace advice given to you by your health care provider. Make sure you discuss any questions you have with your health care provider. Document Released: 02/13/2009 Document Revised: 09/25/2015 Document Reviewed: 05/24/2014 Elsevier Interactive Patient Education  2017 [employer].

## 2022-09-20 NOTE — Progress Notes (Signed)
Subjective:   Susan Jenkins is a 77 y.o. female who presents for Medicare Annual (Subsequent) preventive examination.  Review of Systems    Virtual Visit via Telephone Note  I connected with  Susan Jenkins on 09/20/22 at  8:15 AM EDT by telephone and verified that I am speaking with the correct person using two identifiers.  Location: Patient: Home Provider: Office Persons participating in the virtual visit: patient/Nurse Health Advisor   I discussed the limitations, risks, security and privacy concerns of performing an evaluation and management service by telephone and the availability of in person appointments. The patient expressed understanding and agreed to proceed.  Interactive audio and video telecommunications were attempted between this nurse and patient, however failed, due to patient having technical difficulties OR patient did not have access to video capability.  We continued and completed visit with audio only.  Some vital signs may be absent or patient reported.   Susan Rung, LPN  Cardiac Risk Factors include: advanced age (>74men, >63 women);hypertension     Objective:    Today's Vitals   09/20/22 0827  Weight: 238 lb (108 kg)  Height: 5\' 6"  (1.676 m)   Body mass index is 38.41 kg/m.     09/20/2022    8:34 AM 09/09/2021    9:11 AM 09/04/2020    8:31 AM 09/28/2017   11:56 AM 07/18/2017    3:06 PM 07/13/2017   11:31 AM 07/04/2017   11:20 AM  Advanced Directives  Does Patient Have a Medical Advance Directive? Yes Yes Yes Yes Yes Yes Yes  Type of Estate agent of [place];Living will Healthcare Power of [place];Living will Healthcare Power of [place];Living will  Healthcare Power of [place];Living will Healthcare Power of [place];Living will Healthcare Power of [place];Living will  Does patient want to make changes to medical advance directive?  No - Patient declined No - Patient declined  No - Patient declined No -  Patient declined   Copy of Healthcare Power of Attorney in Chart? No - copy requested No - copy requested No - copy requested   No - copy requested     Current Medications (verified) Outpatient Encounter Medications as of 09/20/2022  Medication Sig   Acetaminophen (TYLENOL ARTHRITIS EXT RELIEF PO) Take by mouth in the morning and at bedtime.   AMBULATORY NON FORMULARY MEDICATION 2% Diltiazem ointment Apply to the anal sphincter 5 times daily (Patient taking differently: daily as needed. 2% Diltiazem ointment Apply to the anal sphincter 5 times daily)   amLODipine (NORVASC) 10 MG tablet Take 1 tablet (10 mg total) by mouth daily.   aspirin 81 MG tablet Take 81 mg by mouth daily.   diltiazem 2 % GEL Apply 1 application. topically 2 (two) times daily.   hydrochlorothiazide (HYDRODIURIL) 25 MG tablet TAKE 1 TABLET (25 MG TOTAL) BY MOUTH DAILY.   ibuprofen (ADVIL,MOTRIN) 200 MG tablet Take 200 mg by mouth as needed.   Loratadine 10 MG CAPS Take by mouth daily.   meclizine (ANTIVERT) 25 MG tablet Take 1 tablet (25 mg total) by mouth 3 (three) times daily as needed for dizziness.   MELATONIN PO Take 3 mg by mouth daily.   Metamucil Fiber CHEW Chew by mouth daily as needed.   Misc Natural Products (GLUCOSAMINE CHONDROITIN ADV PO) Take by mouth 2 (two) times daily.   Multiple Vitamins-Minerals (MULTIVITAMIN ADULT PO) Take by mouth daily.   Omega-3 Fatty Acids (FISH OIL PO) Take by mouth daily.  ondansetron (ZOFRAN) 8 MG tablet Take 1 tablet (8 mg total) by mouth every 8 (eight) hours as needed for nausea or vomiting. Take one 20 minutes before drinking each half of the prep   OVER THE COUNTER MEDICATION Calcium carbonate, vit D, min   Probiotic Product (PROBIOTIC PO) Take by mouth at bedtime.   pyridOXINE (VITAMIN B-6) 50 MG tablet Take 50 mg by mouth daily.   rosuvastatin (CRESTOR) 10 MG tablet Take 1 tablet (10 mg total) by mouth daily.   timolol (TIMOPTIC) 0.5 % ophthalmic solution Place 1 drop  into both eyes 2 (two) times daily.    venlafaxine XR (EFFEXOR-XR) 75 MG 24 hr capsule TAKE 1 CAPSULE BY MOUTH EVERY DAY   [DISCONTINUED] Netarsudil Dimesylate (RHOPRESSA OP) Apply to eye.   No facility-administered encounter medications on file as of 09/20/2022.    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   Procedure: RIGHT TOTAL KNEE ARTHROPLASTY;  Surgeon: Ollen Gross, MD;  Location: [facility];  Service: Orthopedics;  Laterality: Right;   Family History  Problem Relation Age of Onset   Dementia Mother    Colon cancer Father 99   Heart failure Father    Esophageal cancer Maternal Grandmother    Allergic rhinitis Neg Hx    Asthma Neg Hx    Eczema Neg Hx    Urticaria Neg Hx    Angioedema Neg Hx    Rectal cancer Neg Hx    Stomach cancer Neg Hx    Social History   Socioeconomic History   Marital status: Single    Spouse name: Not on file   Number of children: Not on file   Years of education: Not on file   Highest education level: 12th grade  Occupational History   Not on file  Tobacco Use   Smoking status: Former    Types: Cigarettes    Quit date: 05/03/1980    Years since quitting: 42.4   Smokeless tobacco: Never   Tobacco comments:    a .50 a pack;   Vaping Use   Vaping Use: Never used  Substance and Sexual Activity   Alcohol use: Yes    Comment: rarely   Drug use: No   Sexual  activity: Yes  Other Topics Concern   Not on file  Social History Narrative   Not on file   Social Determinants of Health   Financial Resource Strain: Low Risk  (09/20/2022)   Overall Financial Resource Strain (CARDIA)    Difficulty of Paying Living Expenses: Not hard at all  Food Insecurity: No Food Insecurity (09/20/2022)   Hunger Vital Sign    Worried About Running Out of Food in the Last Year: Never true    Ran Out of Food in the Last Year: Never true  Transportation Needs: No Transportation Needs (09/20/2022)   PRAPARE - Transportation  Lack of Transportation (Medical): No    Lack of Transportation (Non-Medical): No  Physical Activity: Sufficiently Active (09/20/2022)   Exercise Vital Sign    Days of Exercise per Week: 5 days    Minutes of Exercise per Session: 60 min  Recent Concern: Physical Activity - Insufficiently Active (06/23/2022)   Exercise Vital Sign    Days of Exercise per Week: 3 days    Minutes of Exercise per Session: 30 min  Stress: No Stress Concern Present (09/20/2022)   [employer] of Occupational Health - Occupational Stress Questionnaire    Feeling of Stress : Not at all  Recent Concern: Stress - Stress Concern Present (06/23/2022)   [employer] of Occupational Health - Occupational Stress Questionnaire    Feeling of Stress : To some extent  Social Connections: Moderately Integrated (09/20/2022)   Social Connection and Isolation Panel [NHANES]    Frequency of Communication with Friends and Family: More than three times a week    Frequency of Social Gatherings with Friends and Family: More than three times a week    Attends Religious Services: More than 4 times per year    Active Member of [employer] or Organizations: Yes    Attends Engineer, structural: More than 4 times per year    Marital Status: Never married    Tobacco Counseling Counseling given: Not Answered Tobacco comments: a .50 a pack;    Clinical Intake:  Pre-visit  preparation completed: Yes  Pain : No/denies pain     BMI - recorded: 38.41 Nutritional Status: BMI > 30  Obese Nutritional Risks: None Diabetes: No  How often do you need to have someone help you when you read instructions, pamphlets, or other written materials from your doctor or pharmacy?: 1 - Never  Diabetic?  No  Interpreter Needed?: No  Information entered by :: Theresa Mulligan LPN   Activities of Daily Living    09/20/2022    8:33 AM 09/19/2022    8:44 AM  In your present state of health, do you have any difficulty performing the following activities:  Hearing? 0 0  Vision? 0 0  Difficulty concentrating or making decisions? 0 0  Walking or climbing stairs? 0 0  Dressing or bathing? 0 0  Doing errands, shopping? 0 0  Preparing Food and eating ? N N  Using the Toilet? N N  In the past six months, have you accidently leaked urine? N N  Do you have problems with loss of bowel control? N N  Managing your Medications? N N  Managing your Finances? N N  Housekeeping or managing your Housekeeping? N N    Patient Care Team: Deeann Saint, MD as PCP - General (Family Medicine) Ollen Gross, MD as Consulting Physician (Orthopedic Surgery) Maris Berger, MD as Consulting Physician (Ophthalmology)  Indicate any recent Medical Services you may have received from other than [facility] providers in the past year (date may be approximate).     Assessment:   This is a routine wellness examination for Rever.  Hearing/Vision screen Hearing Screening - Comments:: Denies hearing difficulties   Vision Screening - Comments:: Wears rx glasses - up to date with routine eye exams with  Maggie Schwalbe  Dietary issues and exercise activities discussed: Exercise limited by: None identified   Goals Addressed               This Visit's Progress     Patient Stated (pt-stated)        I  Will continue to play sports and remain socially active.       Depression Screen     09/20/2022    8:32 AM 06/23/2022    2:09 PM 09/09/2021    9:07 AM 07/01/2021    2:02 PM 09/04/2020    8:32 AM 03/18/2020   12:36 PM 08/27/2019   10:11 AM  PHQ 2/9 Scores  PHQ - 2 Score 0 1 0 0 0 0 0  PHQ- 9 Score  3  1  1      Fall Risk    09/20/2022    8:33 AM 09/19/2022    8:44 AM 09/10/2022   11:23 AM 06/23/2022    2:09 PM 06/23/2022   10:21 AM  Fall Risk   Falls in the past year? 0 0 0 0 0  Number falls in past yr: 0 0 0 0   Injury with Fall? 0 0 0 0   Risk for fall due to : No Fall Risks   No Fall Risks   Follow up Falls prevention discussed   Falls evaluation completed     FALL RISK PREVENTION PERTAINING TO THE HOME:  Any stairs in or around the home? No  If so, are there any without handrails? No  Home free of loose throw rugs in walkways, pet beds, electrical cords, etc? Yes  Adequate lighting in your home to reduce risk of falls? Yes   ASSISTIVE DEVICES UTILIZED TO PREVENT FALLS:  Life alert? No  Use of a cane, walker or w/c? No  Grab bars in the bathroom? Yes Shower chair or bench in shower? No  Elevated toilet seat or a handicapped toilet? Yes  TIMED UP AND GO:  Was the test performed? No . Audio Visit   Cognitive Function:        09/20/2022    8:34 AM 09/09/2021    9:11 AM  6CIT Screen  What Year? 0 points 0 points  What month? 0 points 0 points  What time? 0 points 0 points  Count back from 20 0 points 0 points  Months in reverse 0 points 0 points  Repeat phrase 0 points 0 points  Total Score 0 points 0 points    Immunizations Immunization History  Administered Date(s) Administered   Covid-19, Mrna,Vaccine(Spikevax)31yrs and older 01/25/2022   Fluad Quad(high Dose 65+) 01/13/2022   Hepatitis A 06/03/2010, 04/14/2011   Hepatitis B 06/03/2010, 04/14/2011, 09/15/2011   Influenza Split 03/18/2015   Influenza Whole 02/20/2013   Influenza, High Dose Seasonal PF 02/16/2018, 02/16/2019   Influenza-Unspecified 02/14/2014, 01/29/2016, 03/05/2017,  01/30/2021   Moderna Covid-19 Vaccine Bivalent Booster 14yrs & up 01/30/2021   Moderna Sars-Covid-2 Vaccination 06/01/2019, 07/02/2019, 03/06/2020   Pneumococcal Conjugate-13 03/06/2014   Pneumococcal Polysaccharide-23 04/01/2015   Respiratory Syncytial Virus Vaccine,Recomb Aduvanted(Arexvy) 01/13/2022   Tdap 06/03/2010, 07/04/2017   Zoster Recombinat (Shingrix) 02/17/2018, 06/21/2018   Zoster, Live 01/01/2009    TDAP status: Up to date  Flu Vaccine status: Up to date  Pneumococcal vaccine status: Up to date  Covid-19 vaccine status: Completed vaccines  Qualifies for Shingles Vaccine? Yes   Zostavax completed Yes   Shingrix Completed?: Yes  Screening Tests Health Maintenance  Topic Date Due   COVID-19 Vaccine (6 - 2023-24 season) 10/06/2022 (Originally 03/22/2022)   Hepatitis C Screening  09/20/2023 (Originally 03/09/1964)   COLONOSCOPY (Pts 45-57yrs Insurance coverage will need to be confirmed)  10/02/2022   INFLUENZA VACCINE  12/02/2022   MAMMOGRAM  09/02/2023   Medicare Annual Wellness (AWV)  09/20/2023   DTaP/Tdap/Td (3 - Td or Tdap) 07/05/2027   Pneumonia Vaccine 28+ Years old  Completed   DEXA SCAN  Completed   Zoster Vaccines- Shingrix  Completed   HPV VACCINES  Aged Out    Health Maintenance  There are no preventive care reminders to display for this patient.   Colorectal cancer screening: Type of screening: Colonoscopy. Completed 10/01/21. Repeat every 1 years  Mammogram status: Completed 09/02/22. Repeat every year  Bone Density status: Completed 11/08/19. Results reflect: Bone density results: OSTEOPOROSIS. Repeat every   years.  Lung Cancer Screening: (Low Dose CT Chest recommended if Age 48-80 years, 30 pack-year currently smoking OR have quit w/in 15years.) does not qualify.     Additional Screening:  Hepatitis C Screening: does qualify; Deferred  Vision Screening: Recommended annual ophthalmology exams for early detection of glaucoma and other  disorders of the eye. Is the patient up to date with their annual eye exam?  Yes  Who is the provider or what is the name of the office in which the patient attends annual eye exams? [facility] If pt is not established with a provider, would they like to be referred to a provider to establish care? No .   Dental Screening: Recommended annual dental exams for proper oral hygiene  Community Resource Referral / Chronic Care Management:  CRR required this visit?  No   CCM required this visit?  No      Plan:     I have personally reviewed and noted the following in the patient's chart:   Medical and social history Use of alcohol, tobacco or illicit drugs  Current medications and supplements including opioid prescriptions. Patient is not currently taking opioid prescriptions. Functional ability and status Nutritional status Physical activity Advanced directives List of other physicians Hospitalizations, surgeries, and ER visits in previous 12 months Vitals Screenings to include cognitive, depression, and falls Referrals and appointments  In addition, I have reviewed and discussed with patient certain preventive protocols, quality metrics, and best practice recommendations. A written personalized care plan for preventive services as well as general preventive health recommendations were provided to patient.     Susan Rung, LPN   [id number]   Nurse Notes:Patient due Hep-C Screening

## 2022-09-23 DIAGNOSIS — M1712 Unilateral primary osteoarthritis, left knee: Secondary | ICD-10-CM | POA: Diagnosis not present

## 2022-09-30 DIAGNOSIS — M1712 Unilateral primary osteoarthritis, left knee: Secondary | ICD-10-CM | POA: Diagnosis not present

## 2022-10-06 DIAGNOSIS — M1712 Unilateral primary osteoarthritis, left knee: Secondary | ICD-10-CM | POA: Diagnosis not present

## 2022-10-14 DIAGNOSIS — M9903 Segmental and somatic dysfunction of lumbar region: Secondary | ICD-10-CM | POA: Diagnosis not present

## 2022-10-14 DIAGNOSIS — M542 Cervicalgia: Secondary | ICD-10-CM | POA: Diagnosis not present

## 2022-10-14 DIAGNOSIS — M9901 Segmental and somatic dysfunction of cervical region: Secondary | ICD-10-CM | POA: Diagnosis not present

## 2022-10-14 DIAGNOSIS — M62830 Muscle spasm of back: Secondary | ICD-10-CM | POA: Diagnosis not present

## 2022-11-09 DIAGNOSIS — M1712 Unilateral primary osteoarthritis, left knee: Secondary | ICD-10-CM | POA: Diagnosis not present

## 2022-11-10 DIAGNOSIS — H532 Diplopia: Secondary | ICD-10-CM | POA: Diagnosis not present

## 2022-11-10 DIAGNOSIS — H401122 Primary open-angle glaucoma, left eye, moderate stage: Secondary | ICD-10-CM | POA: Diagnosis not present

## 2022-11-10 DIAGNOSIS — H401111 Primary open-angle glaucoma, right eye, mild stage: Secondary | ICD-10-CM | POA: Diagnosis not present

## 2022-11-10 DIAGNOSIS — H2513 Age-related nuclear cataract, bilateral: Secondary | ICD-10-CM | POA: Diagnosis not present

## 2022-11-10 DIAGNOSIS — H52203 Unspecified astigmatism, bilateral: Secondary | ICD-10-CM | POA: Diagnosis not present

## 2022-11-11 DIAGNOSIS — M542 Cervicalgia: Secondary | ICD-10-CM | POA: Diagnosis not present

## 2022-11-11 DIAGNOSIS — M62830 Muscle spasm of back: Secondary | ICD-10-CM | POA: Diagnosis not present

## 2022-11-11 DIAGNOSIS — M9903 Segmental and somatic dysfunction of lumbar region: Secondary | ICD-10-CM | POA: Diagnosis not present

## 2022-11-11 DIAGNOSIS — M9901 Segmental and somatic dysfunction of cervical region: Secondary | ICD-10-CM | POA: Diagnosis not present

## 2022-11-22 ENCOUNTER — Other Ambulatory Visit: Payer: Self-pay | Admitting: Family Medicine

## 2022-11-22 DIAGNOSIS — F339 Major depressive disorder, recurrent, unspecified: Secondary | ICD-10-CM

## 2022-12-14 DIAGNOSIS — M9901 Segmental and somatic dysfunction of cervical region: Secondary | ICD-10-CM | POA: Diagnosis not present

## 2022-12-14 DIAGNOSIS — M62830 Muscle spasm of back: Secondary | ICD-10-CM | POA: Diagnosis not present

## 2022-12-14 DIAGNOSIS — M9903 Segmental and somatic dysfunction of lumbar region: Secondary | ICD-10-CM | POA: Diagnosis not present

## 2022-12-14 DIAGNOSIS — M542 Cervicalgia: Secondary | ICD-10-CM | POA: Diagnosis not present

## 2022-12-16 DIAGNOSIS — M1712 Unilateral primary osteoarthritis, left knee: Secondary | ICD-10-CM | POA: Diagnosis not present

## 2023-01-06 NOTE — Progress Notes (Unsigned)
Called patient to sch an appt for pre-op paperwork to be filled out, left a VM to call back

## 2023-01-13 ENCOUNTER — Ambulatory Visit (INDEPENDENT_AMBULATORY_CARE_PROVIDER_SITE_OTHER): Payer: PPO | Admitting: Family Medicine

## 2023-01-13 ENCOUNTER — Encounter: Payer: Self-pay | Admitting: Family Medicine

## 2023-01-13 VITALS — BP 124/70 | HR 48 | Temp 98.1°F | Ht 66.0 in | Wt 238.6 lb

## 2023-01-13 DIAGNOSIS — M62830 Muscle spasm of back: Secondary | ICD-10-CM | POA: Diagnosis not present

## 2023-01-13 DIAGNOSIS — Z01818 Encounter for other preprocedural examination: Secondary | ICD-10-CM | POA: Diagnosis not present

## 2023-01-13 DIAGNOSIS — Z23 Encounter for immunization: Secondary | ICD-10-CM | POA: Diagnosis not present

## 2023-01-13 DIAGNOSIS — M9901 Segmental and somatic dysfunction of cervical region: Secondary | ICD-10-CM | POA: Diagnosis not present

## 2023-01-13 DIAGNOSIS — M542 Cervicalgia: Secondary | ICD-10-CM | POA: Diagnosis not present

## 2023-01-13 DIAGNOSIS — M9903 Segmental and somatic dysfunction of lumbar region: Secondary | ICD-10-CM | POA: Diagnosis not present

## 2023-01-13 DIAGNOSIS — I10 Essential (primary) hypertension: Secondary | ICD-10-CM

## 2023-01-13 LAB — POCT GLYCOSYLATED HEMOGLOBIN (HGB A1C): Hemoglobin A1C: 5.9 % — AB (ref 4.0–5.6)

## 2023-01-13 NOTE — Patient Instructions (Signed)
We will fax over the pre-op form to your Orthopedist.   You will need to stop taking Aspirin 5-7 days prior to your surgery.

## 2023-01-13 NOTE — Progress Notes (Signed)
Chief Complaint  Patient presents with   Pre-op Exam    Patient is having left knee surgery in Oct     HPI:  Patient is seen for optimization of general medical care prior to surgery. Surgery type: L TKR Date of surgery: 02/28/23  Kidney disease? No Prior surgeries/Issues following anesthesia? H/o PE s/p surgery in 1971? Hx MI, heart arrythmia, CHF, angina or stroke? none Epilepsy or Seizures? none Arthritis or problems with neck or jaw? none Thyroid disease? none Liver disease? none Asthma, COPD or chronic lung disease? none Diabetes? none (Needs to be evaluated by anesthesia if yes to these questions.)  Other: Poor nutrition, Frail or other: no  METS:  ?Can take care of self, such as eat, dress, or use the toilet (1 MET). yes ?Can walk up a flight of steps or a hill (4 METs).yes ?Can do heavy work around the house such as scrubbing floors or lifting or moving heavy furniture (between 4 and 10 METs). yes ?Can participate in strenuous sports such as swimming, singles tennis, football, basketball, and skiing (>10 METs) . AHA Risks: Major predictors that require intensive management and may lead to delay in or cancellation of the operative procedure unless emergent: NONE   Unstable coronary syndromes including unstable or severe angina or recent MI   Decompensated heart failure including NYHA functional class IV or worsening or new-onset HF   Significant arrhythmias including high grade AV block, symptomatic ventricular arrhythmias, supraventricular arrhythmias with ventricular rate >100 bpm at rest, symptomatic bradycardia, and newly recognized ventricular tachycardia   Severe heart valve disease including severe aortic stenosis or symptomatic mitral stenosis   Other clinical predictors that warrant careful assessment of current status: NONE   History of ischemic heart disease  History of cerebrovascular disease   History of compensated heart failure or prior heart failure    Diabetes mellitus   Renal insufficiency  Type of surgery and Risk: 1) High risk (reported risk of cardiac death or nonfatal myocardial infarction [MI] often greater than 5 percent):   Aortic and other major vascular surgery   Peripheral artery surgery   2)Intermediate risk (reported risk of cardiac death or nonfatal MI generally 1 to 5 percent):   Carotid endarterectomy   Head and neck surgery   Intraperitoneal and intrathoracic surgery   Orthopedic surgery   Prostate surgery   3)Low risk (reported risk of cardiac death or nonfatal MI generally less than 1 percent):   Ambulatory surgery   Endoscopic procedures   Superficial procedure   Cataract surgery   Breast surgery  Medications that need to be addressed prior to surgery: None Discontinue acei/arbs/non-statin lipid lowering drugs day of surgery ASA stop 7 days before or discuss with cardiology if CV risks, other anticoagulants discuss with cardiology.   ROS: See pertinent positives and negatives per HPI. 11 point ROS negative except where noted.  Past Medical History:  Diagnosis Date   Complication of anesthesia    Depression    Glaucoma    Hemorrhoids    History of diverticulitis 09/2015   HOCM (hypertrophic obstructive cardiomyopathy) (HCC)    Hyperlipidemia    Hypertension    PONV (postoperative nausea and vomiting)    Pulmonary embolus (HCC) 1971   following surgery   Vitamin D deficiency     Past Surgical History:  Procedure Laterality Date   ANKLE SURGERY     BACK SURGERY     NASAL SEPTUM SURGERY     TOTAL KNEE ARTHROPLASTY Right 07/18/2017  Procedure: RIGHT TOTAL KNEE ARTHROPLASTY;  Surgeon: Ollen Gross, MD;  Location: [facility];  Service: Orthopedics;  Laterality: Right;    Family History  Problem Relation Age of Onset   Dementia Mother    Colon cancer Father 45   Heart failure Father    Esophageal cancer Maternal Grandmother    Allergic rhinitis Neg Hx    Asthma Neg Hx    Eczema Neg Hx     Urticaria Neg Hx    Angioedema Neg Hx    Rectal cancer Neg Hx    Stomach cancer Neg Hx     Social History   Socioeconomic History   Marital status: Single    Spouse name: Not on file   Number of children: Not on file   Years of education: Not on file   Highest education level: 12th grade  Occupational History   Not on file  Tobacco Use   Smoking status: Former    Current packs/day: 0.00    Types: Cigarettes    Quit date: 05/03/1980    Years since quitting: 42.7   Smokeless tobacco: Never   Tobacco comments:    a .50 a pack;   Vaping Use   Vaping status: Never Used  Substance and Sexual Activity   Alcohol use: Yes    Comment: rarely   Drug use: No   Sexual activity: Yes  Other Topics Concern   Not on file  Social History Narrative   Not on file   Social Determinants of Health   Financial Resource Strain: Low Risk  (09/20/2022)   Overall Financial Resource Strain (CARDIA)    Difficulty of Paying Living Expenses: Not hard at all  Food Insecurity: No Food Insecurity (09/20/2022)   Hunger Vital Sign    Worried About Running Out of Food in the Last Year: Never true    Ran Out of Food in the Last Year: Never true  Transportation Needs: No Transportation Needs (09/20/2022)   PRAPARE - Administrator, Civil Service (Medical): No    Lack of Transportation (Non-Medical): No  Physical Activity: Sufficiently Active (09/20/2022)   Exercise Vital Sign    Days of Exercise per Week: 5 days    Minutes of Exercise per Session: 60 min  Recent Concern: Physical Activity - Insufficiently Active (06/23/2022)   Exercise Vital Sign    Days of Exercise per Week: 3 days    Minutes of Exercise per Session: 30 min  Stress: No Stress Concern Present (09/20/2022)   [employer] of Occupational Health - Occupational Stress Questionnaire    Feeling of Stress : Not at all  Recent Concern: Stress - Stress Concern Present (06/23/2022)   [employer] of Occupational Health -  Occupational Stress Questionnaire    Feeling of Stress : To some extent  Social Connections: Moderately Integrated (09/20/2022)   Social Connection and Isolation Panel [NHANES]    Frequency of Communication with Friends and Family: More than three times a week    Frequency of Social Gatherings with Friends and Family: More than three times a week    Attends Religious Services: More than 4 times per year    Active Member of Clubs or Organizations: Yes    Attends Engineer, structural: More than 4 times per year    Marital Status: Never married     Current Outpatient Medications:    Acetaminophen (TYLENOL ARTHRITIS EXT RELIEF PO), Take by mouth in the morning and at bedtime., Disp: , Rfl:  AMBULATORY NON FORMULARY MEDICATION, 2% Diltiazem ointment Apply to the anal sphincter 5 times daily (Patient taking differently: daily as needed. 2% Diltiazem ointment Apply to the anal sphincter 5 times daily), Disp: 30 g, Rfl: 2   amLODipine (NORVASC) 10 MG tablet, Take 1 tablet (10 mg total) by mouth daily., Disp: 90 tablet, Rfl: 3   aspirin 81 MG tablet, Take 81 mg by mouth daily., Disp: , Rfl:    diltiazem 2 % GEL, Apply 1 application. topically 2 (two) times daily., Disp: 30 g, Rfl: 1   hydrochlorothiazide (HYDRODIURIL) 25 MG tablet, TAKE 1 TABLET (25 MG TOTAL) BY MOUTH DAILY., Disp: 90 tablet, Rfl: 3   ibuprofen (ADVIL,MOTRIN) 200 MG tablet, Take 200 mg by mouth as needed., Disp: , Rfl:    latanoprost (XALATAN) 0.005 % ophthalmic solution, 1 drop at bedtime., Disp: , Rfl:    Loratadine 10 MG CAPS, Take by mouth daily., Disp: , Rfl:    meclizine (ANTIVERT) 25 MG tablet, Take 1 tablet (25 mg total) by mouth 3 (three) times daily as needed for dizziness., Disp: 30 tablet, Rfl: 0   MELATONIN PO, Take 3 mg by mouth daily., Disp: , Rfl:    Metamucil Fiber CHEW, Chew by mouth daily as needed., Disp: , Rfl:    Misc Natural Products (GLUCOSAMINE CHONDROITIN ADV PO), Take by mouth 2 (two) times  daily., Disp: , Rfl:    Multiple Vitamins-Minerals (MULTIVITAMIN ADULT PO), Take by mouth daily., Disp: , Rfl:    Omega-3 Fatty Acids (FISH OIL PO), Take by mouth daily., Disp: , Rfl:    ondansetron (ZOFRAN) 8 MG tablet, Take 1 tablet (8 mg total) by mouth every 8 (eight) hours as needed for nausea or vomiting. Take one 20 minutes before drinking each half of the prep, Disp: 2 tablet, Rfl: 0   OVER THE COUNTER MEDICATION, Calcium carbonate, vit D, min, Disp: , Rfl:    Probiotic Product (PROBIOTIC PO), Take by mouth at bedtime., Disp: , Rfl:    pyridOXINE (VITAMIN B-6) 50 MG tablet, Take 50 mg by mouth daily., Disp: , Rfl:    rosuvastatin (CRESTOR) 10 MG tablet, Take 1 tablet (10 mg total) by mouth daily., Disp: 90 tablet, Rfl: 3   timolol (TIMOPTIC) 0.5 % ophthalmic solution, Place 1 drop into both eyes 2 (two) times daily. , Disp: , Rfl:    venlafaxine XR (EFFEXOR-XR) 75 MG 24 hr capsule, TAKE 1 CAPSULE BY MOUTH EVERY DAY, Disp: 90 capsule, Rfl: 0  EXAM:  Vitals:   01/13/23 0816  BP: 124/70  Pulse: (!) 48  Temp: 98.1 F (36.7 C)  SpO2: 96%    Body mass index is 38.51 kg/m.  GENERAL: vitals reviewed and listed above, alert, oriented, appears well hydrated and in no acute distress  HEENT: atraumatic, conjunttiva clear, no obvious abnormalities on inspection of external nose and ears  NECK: no obvious masses on inspection, no carotid bruits  LUNGS: clear to auscultation bilaterally, no wheezes, rales or rhonchi, good air movement  CV: HRRR, no peripheral edema, no JVD, BP normal range, normal radial pulses  MS: moves all extremities without noticeable abnormality  PSYCH: pleasant and cooperative, no obvious depression or anxiety  ASSESSMENT AND PLAN:  Discussed the following assessment and plan:  Preop examination -Hgb A1C 5.9% this visit - Plan: Hemoglobin A1c  Need for influenza vaccination - Plan: Flu Vaccine Trivalent High Dose (Fluad)  Assessment: -Risk factors:  h/o prior PE after surgery. -Surgery Risks:intermediate -age, nutritional status, fraility: good  nutritional status, age >74, no fraility -functional capacity: > 4 METs without symptoms -comorbidities: none Patient Specific Risks: patient is low risk for intermediate risks surgery  Recommendations for optimizing general medical care prior to surgery: -advised patient to discuss specific risks morbidity and mortality of surgery with surgeon, CV risks discussed with patient -advised patient will defer to surgeon for post-op DVT prophylaxis and post op care -no specific medical recommendations for this patient at this time and no recommendations to defer surgery or for further CV testing prior to surgery -form for pre-op optimization of general medical care prior to surgery faxed to surgeon office  -Patient advised to return or notify a doctor immediately if symptoms worsen or persist or new concerns arise.  Patient Instructions  We will fax over the pre-op form to your Orthopedist.   You will need to stop taking Aspirin 5-7 days prior to your surgery.   Deeann Saint

## 2023-01-14 NOTE — Progress Notes (Unsigned)
Pre-op paperwork has been faxed to [phone]

## 2023-02-01 DIAGNOSIS — L918 Other hypertrophic disorders of the skin: Secondary | ICD-10-CM | POA: Diagnosis not present

## 2023-02-01 DIAGNOSIS — L578 Other skin changes due to chronic exposure to nonionizing radiation: Secondary | ICD-10-CM | POA: Diagnosis not present

## 2023-02-01 DIAGNOSIS — L570 Actinic keratosis: Secondary | ICD-10-CM | POA: Diagnosis not present

## 2023-02-01 DIAGNOSIS — D1801 Hemangioma of skin and subcutaneous tissue: Secondary | ICD-10-CM | POA: Diagnosis not present

## 2023-02-01 DIAGNOSIS — L814 Other melanin hyperpigmentation: Secondary | ICD-10-CM | POA: Diagnosis not present

## 2023-02-01 DIAGNOSIS — L821 Other seborrheic keratosis: Secondary | ICD-10-CM | POA: Diagnosis not present

## 2023-02-08 DIAGNOSIS — M25562 Pain in left knee: Secondary | ICD-10-CM | POA: Diagnosis not present

## 2023-02-08 NOTE — H&P (Signed)
TOTAL KNEE ADMISSION H&P  Patient is being admitted for left total knee arthroplasty.  Subjective:  Chief Complaint: Left knee pain.  HPI: Susan Jenkins, 77 y.o. female has a history of pain and functional disability in the left knee due to arthritis and has failed non-surgical conservative treatments for greater than 12 weeks to include corticosteriod injections, viscosupplementation injections, and activity modification. Onset of symptoms was gradual, starting  several  years ago with gradually worsening course since that time. The patient noted no past surgery on the left knee.  Patient currently rates pain in the left knee at 7 out of 10 with activity. Patient has night pain, worsening of pain with activity and weight bearing, pain with passive range of motion, and crepitus. Patient has evidence of  bone-on-bone arthritis in the medial and patellofemoral compartments of the left knee  by imaging studies.There is no active infection.  Patient Active Problem List   Diagnosis Date Noted   OA (osteoarthritis) of knee 07/18/2017   Allergic contact dermatitis due to metals 06/30/2017   Acute colitis 09/29/2015   Hyponatremia 09/29/2015   Family history of cancer of GI tract 08/29/2013   HOCM (hypertrophic obstructive cardiomyopathy) (HCC) 06/11/2010   OTHER SPECIFIED DISORDERS OF LIVER 05/07/2010   UNSPECIFIED VITAMIN D DEFICIENCY 03/17/2010   Hyperlipemia 03/17/2010   DEPRESSION 03/17/2010   GLUCOMA 03/17/2010   DIPLOPIA 03/17/2010   Essential hypertension 03/17/2010   MITRAL VALVE PROLAPSE 03/17/2010   UNSPEC HEMORRHOIDS WITHOUT MENTION COMPLICATION 03/17/2010   BLOOD IN STOOL 03/17/2010   JOINT PAIN, HAND 03/17/2010   SCIATICA 03/17/2010   BACK PAIN 03/17/2010   CHEST PAIN 03/17/2010    Past Medical History:  Diagnosis Date   Complication of anesthesia    Depression    Glaucoma    Hemorrhoids    History of diverticulitis 09/2015   HOCM (hypertrophic obstructive  cardiomyopathy) (HCC)    Hyperlipidemia    Hypertension    PONV (postoperative nausea and vomiting)    Pulmonary embolus (HCC) 1971   following surgery   Vitamin D deficiency     Past Surgical History:  Procedure Laterality Date   ANKLE SURGERY     BACK SURGERY     NASAL SEPTUM SURGERY     TOTAL KNEE ARTHROPLASTY Right 07/18/2017   Procedure: RIGHT TOTAL KNEE ARTHROPLASTY;  Surgeon: Ollen Gross, MD;  Location: [facility];  Service: Orthopedics;  Laterality: Right;    Prior to Admission medications   Medication Sig Start Date End Date Taking? Authorizing Provider  acetaminophen (TYLENOL) 650 MG CR tablet Take 650 mg by mouth in the morning and at bedtime.   Yes [provider]  amLODipine (NORVASC) 10 MG tablet Take 1 tablet (10 mg total) by mouth daily. 06/23/22  Yes Deeann Saint, MD  aspirin 81 MG tablet Take 81 mg by mouth daily.   Yes [provider]  Calcium Carbonate-Vitamin D (CALCIUM 600-VITAMIN D3 PO) Take 1 tablet by mouth daily.   Yes [provider]  diltiazem 2 % GEL Apply 1 application. topically 2 (two) times daily. 08/03/21  Yes Hilarie Fredrickson, MD  hydrochlorothiazide (HYDRODIURIL) 25 MG tablet TAKE 1 TABLET (25 MG TOTAL) BY MOUTH DAILY. 07/15/22  Yes Deeann Saint, MD  ibuprofen (ADVIL,MOTRIN) 200 MG tablet Take 200 mg by mouth every 8 (eight) hours as needed for moderate pain.   Yes [provider]  latanoprost (XALATAN) 0.005 % ophthalmic solution Place 1 drop into both eyes at bedtime. 12/22/22  Yes [provider]  loratadine (CLARITIN) 10 MG tablet Take 10 mg by mouth daily.   Yes [provider]  meclizine (ANTIVERT) 25 MG tablet Take 1 tablet (25 mg total) by mouth 3 (three) times daily as needed for dizziness. 06/26/18  Yes Deeann Saint, MD  MELATONIN PO Take 3 mg by mouth at bedtime.   Yes [provider]  Metamucil Fiber CHEW Chew 2-3 each by mouth daily.   Yes [provider]  Misc  Natural Products (GLUCOSAMINE CHONDROITIN ADV PO) Take 1 tablet by mouth 2 (two) times daily.   Yes [provider]  Multiple Vitamins-Minerals (MULTIVITAMIN ADULT PO) Take 1 tablet by mouth daily.   Yes [provider]  Omega-3 Fatty Acids (FISH OIL PO) Take 1 capsule by mouth daily.   Yes [provider]  Probiotic Product (PROBIOTIC PO) Take 1 capsule by mouth at bedtime.   Yes [provider]  pyridOXINE (VITAMIN B-6) 50 MG tablet Take 50 mg by mouth daily.   Yes [provider]  rosuvastatin (CRESTOR) 10 MG tablet Take 1 tablet (10 mg total) by mouth daily. 06/23/22  Yes Deeann Saint, MD  timolol (TIMOPTIC) 0.5 % ophthalmic solution Place 1 drop into both eyes 2 (two) times daily.    Yes [provider]  venlafaxine XR (EFFEXOR-XR) 75 MG 24 hr capsule TAKE 1 CAPSULE BY MOUTH EVERY DAY 11/22/22  Yes Deeann Saint, MD  AMBULATORY NON FORMULARY MEDICATION 2% Diltiazem ointment Apply to the anal sphincter 5 times daily Patient not taking: Reported on 02/08/2023 07/02/20   Hilarie Fredrickson, MD    Allergies  Allergen Reactions   Amoxicillin-Pot Clavulanate Anaphylaxis    Has patient had a PCN reaction causing immediate rash, facial/tongue/throat swelling, SOB or lightheadedness with hypotension: Yes Has patient had a PCN reaction causing severe rash involving mucus membranes or skin necrosis: Yes Has patient had a PCN reaction that required hospitalization Yes Has patient had a PCN reaction occurring within the last 10 years: No If all of the above answers are "NO", then may proceed with Cephalosporin use.   Augmentin [Amoxicillin-Pot Clavulanate] Anaphylaxis   Nickel Rash    Social History   Socioeconomic History   Marital status: Single    Spouse name: Not on file   Number of children: Not on file   Years of education: Not on file   Highest education level: 12th grade  Occupational History   Not on file  Tobacco Use   Smoking  status: Former    Current packs/day: 0.00    Types: Cigarettes    Quit date: 05/03/1980    Years since quitting: 42.7   Smokeless tobacco: Never   Tobacco comments:    a .50 a pack;   Vaping Use   Vaping status: Never Used  Substance and Sexual Activity   Alcohol use: Yes    Comment: rarely   Drug use: No   Sexual activity: Yes  Other Topics Concern   Not on file  Social History Narrative   Not on file   Social Determinants of Health   Financial Resource Strain: Low Risk  (09/20/2022)   Overall Financial Resource Strain (CARDIA)    Difficulty of Paying Living Expenses: Not hard at all  Food Insecurity: No Food Insecurity (09/20/2022)   Hunger Vital Sign    Worried About Running Out of Food in the Last Year: Never true    Ran Out of Food in the Last  Year: Never true  Transportation Needs: No Transportation Needs (09/20/2022)   PRAPARE - Administrator, Civil Service (Medical): No    Lack of Transportation (Non-Medical): No  Physical Activity: Sufficiently Active (09/20/2022)   Exercise Vital Sign    Days of Exercise per Week: 5 days    Minutes of Exercise per Session: 60 min  Recent Concern: Physical Activity - Insufficiently Active (06/23/2022)   Exercise Vital Sign    Days of Exercise per Week: 3 days    Minutes of Exercise per Session: 30 min  Stress: No Stress Concern Present (09/20/2022)   [employer] of Occupational Health - Occupational Stress Questionnaire    Feeling of Stress : Not at all  Recent Concern: Stress - Stress Concern Present (06/23/2022)   [employer] of Occupational Health - Occupational Stress Questionnaire    Feeling of Stress : To some extent  Social Connections: Moderately Integrated (09/20/2022)   Social Connection and Isolation Panel [NHANES]    Frequency of Communication with Friends and Family: More than three times a week    Frequency of Social Gatherings with Friends and Family: More than three times a week    Attends  Religious Services: More than 4 times per year    Active Member of [employer] or Organizations: Yes    Attends Engineer, structural: More than 4 times per year    Marital Status: Never married  Intimate Partner Violence: Not At Risk (09/20/2022)   Humiliation, Afraid, Rape, and Kick questionnaire    Fear of Current or Ex-Partner: No    Emotionally Abused: No    Physically Abused: No    Sexually Abused: No    Tobacco Use: Medium Risk (01/13/2023)   Patient History    Smoking Tobacco Use: Former    Smokeless Tobacco Use: Never    Passive Exposure: Not on file   Social History   Substance and Sexual Activity  Alcohol Use Yes   Comment: rarely    Family History  Problem Relation Age of Onset   Dementia Mother    Colon cancer Father 48   Heart failure Father    Esophageal cancer Maternal Grandmother    Allergic rhinitis Neg Hx    Asthma Neg Hx    Eczema Neg Hx    Urticaria Neg Hx    Angioedema Neg Hx    Rectal cancer Neg Hx    Stomach cancer Neg Hx     ROS  Objective:  Physical Exam: Well nourished and well developed.  General: Alert and oriented x3, cooperative and pleasant, no acute distress.  Head: normocephalic, atraumatic, neck supple.  Eyes: EOMI.  Musculoskeletal:  Left knee No effusion. Range of motion is 0 to 125 degrees with crepitus on range of motion. Very tender medially. No lateral tenderness or instability noted.  Calves soft and nontender. Motor function intact in LE. Strength 5/5 LE bilaterally. Neuro: Distal pulses 2+. Sensation to light touch intact in LE.   Imaging Review Plain radiographs demonstrate severe degenerative joint disease of the left knee. The overall alignment is mild varus. The bone quality appears to be adequate for age and reported activity level.  Assessment/Plan:  End stage arthritis, left knee   The patient history, physical examination, clinical judgment of the provider and imaging studies are consistent with end  stage degenerative joint disease of the left knee and total knee arthroplasty is deemed medically necessary. The treatment options including medical management, injection therapy arthroscopy and arthroplasty were  discussed at length. The risks and benefits of total knee arthroplasty were presented and reviewed. The risks due to aseptic loosening, infection, stiffness, patella tracking problems, thromboembolic complications and other imponderables were discussed. The patient acknowledged the explanation, agreed to proceed with the plan and consent was signed. Patient is being admitted for inpatient treatment for surgery, pain control, PT, OT, prophylactic antibiotics, VTE prophylaxis, progressive ambulation and ADLs and discharge planning. The patient is planning to be discharged  home .   Patient's anticipated LOS is less than 2 midnights, meeting these requirements: - Lives within 1 hour of care - Has a competent adult at home to recover with post-op recover - NO history of  - Chronic pain requiring opioids  - Diabetes  - Coronary Artery Disease  - Heart failure  - Heart attack  - Stroke  - Cardiac arrhythmia  - Respiratory Failure/COPD  - Renal failure  - Anemia  - Advanced Liver disease  Therapy Plans: Outpatient therapy at [facility] Disposition: Home with sister Planned DVT Prophylaxis: Xarelto 10 mg QD DME Needed: None PCP: Abbe Amsterdam, MD (clearance received) TXA: Topical Allergies: NICKEL, augmentin Metal Allergy: YES Anesthesia Concerns: Nausea BMI: 37.7 Last HgbA1c: Not diabetic Pain Regimen: Oxycodone Pharmacy: [facility] ([address])  Other: - It was discussed at [facility] that she had right thigh pain from tourniquet, we discussed this further today and she DOES want the tourniquet - Already scheduled for [facility] - Hx of DVT/PE with previous right knee surgery (in the 80s w/ Dr. Fannie Knee)  - Patient was instructed on what medications to stop prior to surgery. - Follow-up  visit in 2 weeks with Dr. Lequita Halt - Begin physical therapy following surgery - Pre-operative lab work as pre-surgical testing - Prescriptions will be provided in hospital at time of discharge  Arther Abbott, PA-C Orthopedic Surgery [facility] [place]

## 2023-02-10 DIAGNOSIS — M62830 Muscle spasm of back: Secondary | ICD-10-CM | POA: Diagnosis not present

## 2023-02-10 DIAGNOSIS — M542 Cervicalgia: Secondary | ICD-10-CM | POA: Diagnosis not present

## 2023-02-10 DIAGNOSIS — M9901 Segmental and somatic dysfunction of cervical region: Secondary | ICD-10-CM | POA: Diagnosis not present

## 2023-02-10 DIAGNOSIS — M9903 Segmental and somatic dysfunction of lumbar region: Secondary | ICD-10-CM | POA: Diagnosis not present

## 2023-02-14 NOTE — Progress Notes (Signed)
COVID Vaccine Completed: yes  Date of COVID positive in last 90 days:  PCP - Abbe Amsterdam, MD Cardiologist -   Clearance by Abbe Amsterdam, MD 01/13/23 Epic  Chest x-ray -  EKG -  Stress Test -  ECHO -  Cardiac Cath - 1990s Pacemaker/ICD device last checked: Spinal Cord Stimulator:  Bowel Prep -   Sleep Study -  CPAP -   Fasting Blood Sugar - preDM Checks Blood Sugar _____ times a day  Last dose of GLP1 agonist-  N/A GLP1 instructions:  N/A   Last dose of SGLT-2 inhibitors-  N/A SGLT-2 instructions: N/A   Blood Thinner Instructions:  Time Aspirin Instructions: ASA 81, hold 7 days Last Dose:  Activity level:  Can go up a flight of stairs and perform activities of daily living without stopping and without symptoms of chest pain or shortness of breath.  Able to exercise without symptoms  Unable to go up a flight of stairs without symptoms of     Anesthesia review: HTN, mitral valve prolapse, cardiomyopathy, PE post surgery  Patient denies shortness of breath, fever, cough and chest pain at PAT appointment  Patient verbalized understanding of instructions that were given to them at the PAT appointment. Patient was also instructed that they will need to review over the PAT instructions again at home before surgery.

## 2023-02-14 NOTE — Patient Instructions (Signed)
SURGICAL WAITING ROOM VISITATION  Patients having surgery or a procedure may have no more than 2 support people in the waiting area - these visitors may rotate.    Children under the age of 19 must have an adult with them who is not the patient.  Due to an increase in RSV and influenza rates and associated hospitalizations, children ages 71 and under may not visit patients in [facility] hospitals.  If the patient needs to stay at the hospital during part of their recovery, the visitor guidelines for inpatient rooms apply. Pre-op nurse will coordinate an appropriate time for 1 support person to accompany patient in pre-op.  This support person may not rotate.    Please refer to the [facility] website for the visitor guidelines for Inpatients (after your surgery is over and you are in a regular room).    Your procedure is scheduled on: 02/28/23   Report to [facility] Main Entrance    Report to admitting at 9:05 AM   Call this number if you have problems the morning of surgery [phone]   Do not eat food :After Midnight.   After Midnight you may have the following liquids until 8:35 AM DAY OF SURGERY  Water Non-Citrus Juices (without pulp, NO RED-Apple, White grape, White cranberry) Black Coffee (NO MILK/CREAM OR CREAMERS, sugar ok)  Clear Tea (NO MILK/CREAM OR CREAMERS, sugar ok) regular and decaf                             Plain Jell-O (NO RED)                                           Fruit ices (not with fruit pulp, NO RED)                                     Popsicles (NO RED)                                                               Sports drinks like Gatorade (NO RED)                 The day of surgery:  Drink ONE (1) Pre-Surgery G2 at 8:35 AM the morning of surgery. Drink in one sitting. Do not sip.  This drink was given to you during your hospital  pre-op appointment visit. Nothing else to drink after completing the  Pre-Surgery G2.          If you  have questions, please contact your surgeon's office.   FOLLOW BOWEL PREP AND ANY ADDITIONAL PRE OP INSTRUCTIONS YOU RECEIVED FROM YOUR SURGEON'S OFFICE!!!     Oral Hygiene is also important to reduce your risk of infection.                                    Remember - BRUSH YOUR TEETH THE MORNING OF SURGERY WITH YOUR REGULAR TOOTHPASTE  DENTURES WILL BE REMOVED  PRIOR TO SURGERY PLEASE DO NOT APPLY "Poly grip" OR ADHESIVES!!!   Stop all vitamins and herbal supplements 7 days before surgery.   Take these medicines the morning of surgery with A SIP OF WATER: Tylenol, Amlodipine, Claritin, Meclizine, Rosuvastatin, Venlafaxine                               You may not have any metal on your body including hair pins, jewelry, and body piercing             Do not wear make-up, lotions, powders, perfumes, or deodorant  Do not wear nail polish including gel and S&S, artificial/acrylic nails, or any other type of covering on natural nails including finger and toenails. If you have artificial nails, gel coating, etc. that needs to be removed by a nail salon please have this removed prior to surgery or surgery may need to be canceled/ delayed if the surgeon/ anesthesia feels like they are unable to be safely monitored.   Do not shave  48 hours prior to surgery.    Do not bring valuables to the hospital. [facility] IS NOT             RESPONSIBLE   FOR VALUABLES.   Contacts, glasses, dentures or bridgework may not be worn into surgery.   Bring small overnight bag day of surgery.   DO NOT BRING YOUR HOME MEDICATIONS TO THE HOSPITAL. PHARMACY WILL DISPENSE MEDICATIONS LISTED ON YOUR MEDICATION LIST TO YOU DURING YOUR ADMISSION IN THE HOSPITAL!   Special Instructions: Bring a copy of your healthcare power of attorney and living will documents the day of surgery if you haven't scanned them before.              Please read over the following fact sheets you were given: IF YOU HAVE QUESTIONS ABOUT  YOUR PRE-OP INSTRUCTIONS PLEASE CALL [name]    If you received a COVID test during your pre-op visit  it is requested that you wear a mask when out in public, stay away from anyone that may not be feeling well and notify your surgeon if you develop symptoms. If you test positive for Covid or have been in contact with anyone that has tested positive in the last 10 days please notify you surgeon.      Pre-operative 5 CHG Bath Instructions   You can play a key role in reducing the risk of infection after surgery. Your skin needs to be as free of germs as possible. You can reduce the number of germs on your skin by washing with CHG (chlorhexidine gluconate) soap before surgery. CHG is an antiseptic soap that kills germs and continues to kill germs even after washing.   DO NOT use if you have an allergy to chlorhexidine/CHG or antibacterial soaps. If your skin becomes reddened or irritated, stop using the CHG and notify one of our RNs at [phone].   Please shower with the CHG soap starting 4 days before surgery using the following schedule:     Please keep in mind the following:  DO NOT shave, including legs and underarms, starting the day of your first shower.   You may shave your face at any point before/day of surgery.  Place clean sheets on your bed the day you start using CHG soap. Use a clean washcloth (not used since being washed) for each shower. DO NOT sleep with pets once you start  using the CHG.   CHG Shower Instructions:  If you choose to wash your hair and private area, wash first with your normal shampoo/soap.  After you use shampoo/soap, rinse your hair and body thoroughly to remove shampoo/soap residue.  Turn the water OFF and apply about 3 tablespoons (45 ml) of CHG soap to a CLEAN washcloth.  Apply CHG soap ONLY FROM YOUR NECK DOWN TO YOUR TOES (washing for 3-5 minutes)  DO NOT use CHG soap on face, private areas, open wounds, or sores.  Pay special  attention to the area where your surgery is being performed.  If you are having back surgery, having someone wash your back for you may be helpful. Wait 2 minutes after CHG soap is applied, then you may rinse off the CHG soap.  Pat dry with a clean towel  Put on clean clothes/pajamas   If you choose to wear lotion, please use ONLY the CHG-compatible lotions on the back of this paper.     Additional instructions for the day of surgery: DO NOT APPLY any lotions, deodorants, cologne, or perfumes.   Put on clean/comfortable clothes.  Brush your teeth.  Ask your nurse before applying any prescription medications to the skin.      CHG Compatible Lotions   Aveeno Moisturizing lotion  Cetaphil Moisturizing Cream  Cetaphil Moisturizing Lotion  Clairol Herbal Essence Moisturizing Lotion, Dry Skin  Clairol Herbal Essence Moisturizing Lotion, Extra Dry Skin  Clairol Herbal Essence Moisturizing Lotion, Normal Skin  Curel Age Defying Therapeutic Moisturizing Lotion with Alpha Hydroxy  Curel Extreme Care Body Lotion  Curel Soothing Hands Moisturizing Hand Lotion  Curel Therapeutic Moisturizing Cream, Fragrance-Free  Curel Therapeutic Moisturizing Lotion, Fragrance-Free  Curel Therapeutic Moisturizing Lotion, Original Formula  Eucerin Daily Replenishing Lotion  Eucerin Dry Skin Therapy Plus Alpha Hydroxy Crme  Eucerin Dry Skin Therapy Plus Alpha Hydroxy Lotion  Eucerin Original Crme  Eucerin Original Lotion  Eucerin Plus Crme Eucerin Plus Lotion  Eucerin TriLipid Replenishing Lotion  Keri Anti-Bacterial Hand Lotion  Keri Deep Conditioning Original Lotion Dry Skin Formula Softly Scented  Keri Deep Conditioning Original Lotion, Fragrance Free Sensitive Skin Formula  Keri Lotion Fast Absorbing Fragrance Free Sensitive Skin Formula  Keri Lotion Fast Absorbing Softly Scented Dry Skin Formula  Keri Original Lotion  Keri Skin Renewal Lotion Keri Silky Smooth Lotion  Keri Silky Smooth  Sensitive Skin Lotion  Nivea Body Creamy Conditioning Oil  Nivea Body Extra Enriched Lotion  Nivea Body Original Lotion  Nivea Body Sheer Moisturizing Lotion Nivea Crme  Nivea Skin Firming Lotion  NutraDerm 30 Skin Lotion  NutraDerm Skin Lotion  NutraDerm Therapeutic Skin Cream  NutraDerm Therapeutic Skin Lotion  ProShield Protective Hand Cream  Provon moisturizing lotion   Incentive Spirometer  An incentive spirometer is a tool that can help keep your lungs clear and active. This tool measures how well you are filling your lungs with each breath. Taking long deep breaths may help reverse or decrease the chance of developing breathing (pulmonary) problems (especially infection) following: A long period of time when you are unable to move or be active. BEFORE THE PROCEDURE  If the spirometer includes an indicator to show your best effort, your nurse or respiratory therapist will set it to a desired goal. If possible, sit up straight or lean slightly forward. Try not to slouch. Hold the incentive spirometer in an upright position. INSTRUCTIONS FOR USE  Sit on the edge of your bed if possible, or sit up as far as  you can in bed or on a chair. Hold the incentive spirometer in an upright position. Breathe out normally. Place the mouthpiece in your mouth and seal your lips tightly around it. Breathe in slowly and as deeply as possible, raising the piston or the ball toward the top of the column. Hold your breath for 3-5 seconds or for as long as possible. Allow the piston or ball to fall to the bottom of the column. Remove the mouthpiece from your mouth and breathe out normally. Rest for a few seconds and repeat Steps 1 through 7 at least 10 times every 1-2 hours when you are awake. Take your time and take a few normal breaths between deep breaths. The spirometer may include an indicator to show your best effort. Use the indicator as a goal to work toward during each repetition. After each  set of 10 deep breaths, practice coughing to be sure your lungs are clear. If you have an incision (the cut made at the time of surgery), support your incision when coughing by placing a pillow or rolled up towels firmly against it. Once you are able to get out of bed, walk around indoors and cough well. You may stop using the incentive spirometer when instructed by your caregiver.  RISKS AND COMPLICATIONS Take your time so you do not get dizzy or light-headed. If you are in pain, you may need to take or ask for pain medication before doing incentive spirometry. It is harder to take a deep breath if you are having pain. AFTER USE Rest and breathe slowly and easily. It can be helpful to keep track of a log of your progress. Your caregiver can provide you with a simple table to help with this. If you are using the spirometer at home, follow these instructions: SEEK MEDICAL CARE IF:  You are having difficultly using the spirometer. You have trouble using the spirometer as often as instructed. Your pain medication is not giving enough relief while using the spirometer. You develop fever of 100.5 F (38.1 C) or higher. SEEK IMMEDIATE MEDICAL CARE IF:  You cough up bloody sputum that had not been present before. You develop fever of 102 F (38.9 C) or greater. You develop worsening pain at or near the incision site. MAKE SURE YOU:  Understand these instructions. Will watch your condition. Will get help right away if you are not doing well or get worse. Document Released: 08/30/2006 Document Revised: 07/12/2011 Document Reviewed: 10/31/2006 [facility] Patient Information 2014 [place], [place].   ________________________________________________________________________

## 2023-02-15 ENCOUNTER — Encounter (HOSPITAL_COMMUNITY)
Admission: RE | Admit: 2023-02-15 | Discharge: 2023-02-15 | Disposition: A | Discharge status: Home / Self Care | Payer: PPO | Source: Ambulatory Visit | Attending: Orthopedic Surgery | Admitting: Orthopedic Surgery

## 2023-02-15 ENCOUNTER — Other Ambulatory Visit: Payer: Self-pay

## 2023-02-15 ENCOUNTER — Encounter (HOSPITAL_COMMUNITY): Payer: Self-pay

## 2023-02-15 VITALS — BP 154/67 | HR 61 | Temp 98.4°F | Resp 18 | Ht 66.0 in | Wt 237.0 lb

## 2023-02-15 DIAGNOSIS — M62830 Muscle spasm of back: Secondary | ICD-10-CM | POA: Diagnosis not present

## 2023-02-15 DIAGNOSIS — I10 Essential (primary) hypertension: Secondary | ICD-10-CM | POA: Diagnosis not present

## 2023-02-15 DIAGNOSIS — M9901 Segmental and somatic dysfunction of cervical region: Secondary | ICD-10-CM | POA: Diagnosis not present

## 2023-02-15 DIAGNOSIS — Z01818 Encounter for other preprocedural examination: Secondary | ICD-10-CM | POA: Diagnosis not present

## 2023-02-15 DIAGNOSIS — M542 Cervicalgia: Secondary | ICD-10-CM | POA: Diagnosis not present

## 2023-02-15 DIAGNOSIS — M9903 Segmental and somatic dysfunction of lumbar region: Secondary | ICD-10-CM | POA: Diagnosis not present

## 2023-02-15 LAB — CBC
HCT: 38.8 % (ref 36.0–46.0)
Hemoglobin: 13.2 g/dL (ref 12.0–15.0)
MCH: 34.8 pg — ABNORMAL HIGH (ref 26.0–34.0)
MCHC: 34 g/dL (ref 30.0–36.0)
MCV: 102.4 fL — ABNORMAL HIGH (ref 80.0–100.0)
Platelets: 318 10*3/uL (ref 150–400)
RBC: 3.79 MIL/uL — ABNORMAL LOW (ref 3.87–5.11)
RDW: 12.1 % (ref 11.5–15.5)
WBC: 9.2 10*3/uL (ref 4.0–10.5)
nRBC: 0 % (ref 0.0–0.2)

## 2023-02-15 LAB — BASIC METABOLIC PANEL
Anion gap: 10 (ref 5–15)
BUN: 13 mg/dL (ref 8–23)
CO2: 28 mmol/L (ref 22–32)
Calcium: 9.7 mg/dL (ref 8.9–10.3)
Chloride: 97 mmol/L — ABNORMAL LOW (ref 98–111)
Creatinine, Ser: 0.82 mg/dL (ref 0.44–1.00)
GFR, Estimated: >60 mL/min (ref >60)
Glucose, Bld: 113 mg/dL — ABNORMAL HIGH (ref 70–99)
Potassium: 3.9 mmol/L (ref 3.5–5.1)
Sodium: 135 mmol/L (ref 135–145)

## 2023-02-15 LAB — SURGICAL PCR SCREEN
MRSA, PCR: NEGATIVE
Staphylococcus aureus: NEGATIVE

## 2023-02-16 ENCOUNTER — Encounter (HOSPITAL_COMMUNITY): Payer: Self-pay

## 2023-02-16 ENCOUNTER — Telehealth: Payer: Self-pay | Admitting: *Deleted

## 2023-02-16 DIAGNOSIS — Z01818 Encounter for other preprocedural examination: Secondary | ICD-10-CM

## 2023-02-16 DIAGNOSIS — I421 Obstructive hypertrophic cardiomyopathy: Secondary | ICD-10-CM

## 2023-02-16 NOTE — Progress Notes (Signed)
DISCUSSION: Susan Jenkins is a 77 yo female who presents to PAT prior to left TKA. PMH of HTN, HLD, HOCM, remote hx of PE (in 1970s)  Complication from anesthesia includes PONV  Patient was followed with Cardiology in the past. She was seen in 2011 for chest pain. She had a cath in 1990s and May 2007 which showed normal coronaries and EF. EKG has always been abnormal with marked sinus bradycardia, usually in the 40s and diffuse T wave inversions. She had a CT coronary on 04/06/2010 which showed likely HOCM. Echo on 04/23/2010 (in media tab) showed moderate LVH, grade 2 DD, trivial AR, mildly dilated LA. Holter monitor was recommended which showed sinus rhythm and no nonsustained V. tach.  She was advised to follow-up in 1 year and has since been lost to follow-up.  She had a right TKA in 2019 without any anesthesia complications.  Patient had a preop exam with her PCP on 01/13/23. She was cleared for surgery.  Cardiac clearance requested due to being lost to follow up and request for updated echo. Echo showed LV EF 60-65% with "apical variant hypertrophic cardiomyopathy". Had a f/u with Dr. Eden Emms on 02/22/23 and was cleared for surgery. 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 Will get cardiac MRI to further asses apical defect and scar burden in regard to arrhythmic substrate She will need 5 mg valium before study and knowsn to get a driver to transport her"  VS: BP (!) 154/67   Pulse 61   Temp 36.9 C (Oral)   Resp 18   Ht 5\' 6"  (1.676 m)   Wt 107.5 kg   SpO2 95%   BMI 38.25 kg/m   PROVIDERS: Deeann Saint, MD Cardiology: Charlton Haws, MD  LABS: Labs reviewed: Acceptable for surgery. (all labs ordered are listed, but only abnormal results are displayed)  Labs Reviewed   BASIC METABOLIC PANEL - Abnormal; Notable for the following components:      Result Value   Chloride 97 (*)    Glucose, Bld 113 (*)    All other components within normal limits  CBC - Abnormal; Notable for the following components:   RBC 3.79 (*)    MCV 102.4 (*)    MCH 34.8 (*)    All other components within normal limits  SURGICAL PCR SCREEN     IMAGES:   EKG 02/15/23:  Sinus bradycardia, 54 Diffuse T wave inversions Appears similar to prior   CV:  Echo 02/17/2023:  IMPRESSIONS     1. Left ventricular ejection fraction, by estimation, is 60 to 65%. The  left ventricle has normal function. The left ventricle demonstrates  regional wall motion abnormalities with a small dyskinetic pouch at the  true apex. There is asymmetric moderate-severe apical hypertrophy with the small apical dyskinetic pouch  seen best in the 4-chamber view. There is a mid-cavity LV gradient, peak  around 30 mmHg. Left ventricular diastolic parameters are consistent with  Grade I diastolic dysfunction (impaired relaxation). This picture is most consistent with apical variant  hypertrophic cardiomyopathy. Consider cardiac MRI for delayed enhancement evaluation/risk stratification.   2. Right ventricular systolic function is mildly reduced. The right  ventricular size is normal. Tricuspid regurgitation signal is inadequate  for assessing PA pressure.   3. Left atrial size  was moderately dilated.   4. The mitral valve is normal in structure. Trivial mitral valve  regurgitation. No evidence of mitral stenosis.   5. The aortic valve is tricuspid. There is mild calcification of the  aortic valve. Aortic valve regurgitation is mild. Aortic valve  sclerosis/calcification is present, without any evidence of aortic  stenosis.   6. The inferior vena cava is normal in size with greater than 50%  respiratory variability, suggesting right atrial pressure of 3 mmHg.   7. Cystic structure in liver measuring  5.5 x 4.3 cm, consider dedicated  ultrasound.    CTA coronary 04/06/2010: Impression:      1)    Calcium Score 0         2)    Normal right dominant coronary arteries  3)    No significant lung or soft tissue findings  4)    Abnormal LV apex.  Suggest correlation with echo or MRI.  Most likely apical hypertrophic cardiomyopathy with apical  ballooning.   Past Medical History:  Diagnosis Date   Complication of anesthesia    Depression    Glaucoma    Hemorrhoids    History of diverticulitis 09/2015   HOCM (hypertrophic obstructive cardiomyopathy) (HCC)    Hyperlipidemia    Hypertension    PONV (postoperative nausea and vomiting)    Pulmonary embolus (HCC) 1971   following surgery   Vitamin D deficiency     Past Surgical History:  Procedure Laterality Date   ANKLE SURGERY     BACK SURGERY     DG THUMB LEFT HAND     tendon   NASAL SEPTUM SURGERY     TOTAL KNEE ARTHROPLASTY Right 07/18/2017   Procedure: RIGHT TOTAL KNEE ARTHROPLASTY;  Surgeon: Ollen Gross, MD;  Location: [facility] ORS;  Service: Orthopedics;  Laterality: Right;    MEDICATIONS:  acetaminophen (TYLENOL) 650 MG CR tablet   AMBULATORY NON FORMULARY MEDICATION   amLODipine (NORVASC) 10 MG tablet   aspirin 81 MG tablet   Calcium Carbonate-Vitamin D (CALCIUM 600-VITAMIN D3 PO)   diltiazem 2 % GEL   hydrochlorothiazide (HYDRODIURIL) 25 MG tablet   ibuprofen (ADVIL,MOTRIN) 200 MG tablet   latanoprost (XALATAN) 0.005 % ophthalmic solution   loratadine (CLARITIN) 10 MG tablet   meclizine (ANTIVERT) 25 MG tablet   MELATONIN PO   Metamucil Fiber CHEW   Misc Natural Products (GLUCOSAMINE CHONDROITIN ADV PO)   Multiple Vitamins-Minerals (MULTIVITAMIN ADULT PO)   Omega-3 Fatty Acids (FISH OIL PO)   Probiotic Product (PROBIOTIC PO)   pyridOXINE (VITAMIN B-6) 50 MG tablet   rosuvastatin (CRESTOR) 10 MG tablet   timolol (TIMOPTIC) 0.5 % ophthalmic solution   venlafaxine XR (EFFEXOR-XR) 75 MG 24 hr capsule   No  current facility-administered medications for this encounter.   Marcille Blanco [facility] Surgical Short Stay/Anesthesiology [facility] Phone [phone] 02/23/2023 8:54 AM

## 2023-02-16 NOTE — Telephone Encounter (Signed)
Pt has new pt appt with Dr. Eden Emms. I will forward this to pre op APP as well if needing to add any notes .

## 2023-02-16 NOTE — Telephone Encounter (Signed)
   Pre-operative Risk Assessment    Patient Name: Susan Jenkins  DOB: October 29, 1945 MRN: [id number]    DATE OF LAST VISIT: NONE; PT NEEDS NEW PT APPT; OUR SCHEDULING TEAM IS WORKING HARD TO FIND A NEW PT APPT  DATE OF NEXT VISIT: NONE   Request for Surgical Clearance    Procedure:   LEFT TOTAL KNEE ARTHROPLASTY  Date of Surgery:  Clearance 02/28/23                                 Surgeon:  DR. Ollen Gross Surgeon's Group or Practice Name:  [facility] Phone number:  [phone] ATTN: Aida Raider Fax number:  [phone]   Type of Clearance Requested:   - Medical ; ASA    Type of Anesthesia:   CHOICE   Additional requests/questions:    Elpidio Anis   02/16/2023, 3:38 PM

## 2023-02-16 NOTE — Progress Notes (Signed)
CARDIOLOGY CONSULT NOTE       Patient ID: Susan Jenkins MRN: [id number] DOB/AGE: May 28, 1945 77 y.o.  Admit date: (Not on file) Referring Physician: Alusio Primary Physician: Deeann Saint, MD Primary Cardiologist: New Reason for Consultation: Preoperative evaluation   HPI:  77 y.o. referred by Dr Despina Hick for preoperative consult prior to left TKR. History of HLD, HTN, post surgical PE and diverticulitis. Surgery scheduled for 02/28/23 Was seen by family medicine Abbe Amsterdam MD 01/13/23 with very thorough note indicated ok for surgery without any further testing ??  Seen by Dr Jens Som in past. Had normal cath 2007 normal Cardiac CT 2011 with calcium score of 0. ? Abnormal apex with apical hypertrophic cardiomyopathy. ? Apical ballooning Does not appear that MRI done to confirm and no recent TTE in Epic to review.   ECG done 10/15 with SR LVH inferior lateral T wave inversions ECG was same and stable since 2013.   TTE 02/17/23 EF 60-65% small dyskinetic pouch at apex peak mid cavitary gradient 30 mmHg   She has mild exertional dyspnea playing soft ball. No pre syncope palpitations or chest pain BP under good control with norvasc. 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 [employer]  ROS All other systems reviewed and negative except as noted above  Past Medical History:  Diagnosis Date   Complication of anesthesia    Depression    Glaucoma    Hemorrhoids    History of diverticulitis 09/2015   HOCM (hypertrophic obstructive cardiomyopathy) (HCC)    Hyperlipidemia    Hypertension    PONV (postoperative nausea and vomiting)    Pulmonary embolus (HCC) 1971   following surgery   Vitamin D deficiency     Family History  Problem Relation Age of Onset   Dementia Mother    Colon cancer Father 38   Heart failure Father    Esophageal cancer Maternal Grandmother    Allergic rhinitis Neg Hx    Asthma Neg Hx    Eczema Neg Hx     Urticaria Neg Hx    Angioedema Neg Hx    Rectal cancer Neg Hx    Stomach cancer Neg Hx     Social History   Socioeconomic History   Marital status: Single    Spouse name: Not on file   Number of children: Not on file   Years of education: Not on file   Highest education level: 12th grade  Occupational History   Not on file  Tobacco Use   Smoking status: Former    Current packs/day: 0.00    Types: Cigarettes    Quit date: 05/03/1980    Years since quitting: 42.8   Smokeless tobacco: Never   Tobacco comments:    a .50 a pack;   Vaping Use   Vaping status: Never Used  Substance and Sexual Activity   Alcohol use: Yes    Comment: rarely   Drug use: No   Sexual activity: Yes  Other Topics Concern   Not on file  Social History Narrative   Not on file   Social Determinants of Health   Financial Resource Strain: Low Risk  (09/20/2022)   Overall Financial Resource Strain (CARDIA)    Difficulty of Paying Living Expenses: Not hard at all  Food Insecurity: No Food Insecurity (09/20/2022)   Hunger Vital Sign    Worried About Running Out of Food in the Last Year: Never true  Ran Out of Food in the Last Year: Never true  Transportation Needs: No Transportation Needs (09/20/2022)   PRAPARE - Administrator, Civil Service (Medical): No    Lack of Transportation (Non-Medical): No  Physical Activity: Sufficiently Active (09/20/2022)   Exercise Vital Sign    Days of Exercise per Week: 5 days    Minutes of Exercise per Session: 60 min  Recent Concern: Physical Activity - Insufficiently Active (06/23/2022)   Exercise Vital Sign    Days of Exercise per Week: 3 days    Minutes of Exercise per Session: 30 min  Stress: No Stress Concern Present (09/20/2022)   [employer] of Occupational Health - Occupational Stress Questionnaire    Feeling of Stress : Not at all  Recent Concern: Stress - Stress Concern Present (06/23/2022)   [employer] of Occupational Health -  Occupational Stress Questionnaire    Feeling of Stress : To some extent  Social Connections: Moderately Integrated (09/20/2022)   Social Connection and Isolation Panel [NHANES]    Frequency of Communication with Friends and Family: More than three times a week    Frequency of Social Gatherings with Friends and Family: More than three times a week    Attends Religious Services: More than 4 times per year    Active Member of [employer] or Organizations: Yes    Attends Banker Meetings: More than 4 times per year    Marital Status: Never married  Intimate Partner Violence: Not At Risk (09/20/2022)   Humiliation, Afraid, Rape, and Kick questionnaire    Fear of Current or Ex-Partner: No    Emotionally Abused: No    Physically Abused: No    Sexually Abused: No    Past Surgical History:  Procedure Laterality Date   ANKLE SURGERY     BACK SURGERY     DG THUMB LEFT HAND     tendon   NASAL SEPTUM SURGERY     TOTAL KNEE ARTHROPLASTY Right 07/18/2017   Procedure: RIGHT TOTAL KNEE ARTHROPLASTY;  Surgeon: Ollen Gross, MD;  Location: [facility];  Service: Orthopedics;  Laterality: Right;      Current Outpatient Medications:    acetaminophen (TYLENOL) 650 MG CR tablet, Take 650 mg by mouth in the morning and at bedtime., Disp: , Rfl:    AMBULATORY NON FORMULARY MEDICATION, 2% Diltiazem ointment Apply to the anal sphincter 5 times daily, Disp: 30 g, Rfl: 2   amLODipine (NORVASC) 10 MG tablet, Take 1 tablet (10 mg total) by mouth daily., Disp: 90 tablet, Rfl: 3   aspirin 81 MG tablet, Take 81 mg by mouth daily., Disp: , Rfl:    Calcium Carbonate-Vitamin D (CALCIUM 600-VITAMIN D3 PO), Take 1 tablet by mouth daily., Disp: , Rfl:    diltiazem 2 % GEL, Apply 1 application. topically 2 (two) times daily., Disp: 30 g, Rfl: 1   hydrochlorothiazide (HYDRODIURIL) 25 MG tablet, TAKE 1 TABLET (25 MG TOTAL) BY MOUTH DAILY., Disp: 90 tablet, Rfl: 3   ibuprofen (ADVIL,MOTRIN) 200 MG tablet, Take 200 mg by  mouth every 8 (eight) hours as needed for moderate pain., Disp: , Rfl:    latanoprost (XALATAN) 0.005 % ophthalmic solution, Place 1 drop into both eyes at bedtime., Disp: , Rfl:    loratadine (CLARITIN) 10 MG tablet, Take 10 mg by mouth daily., Disp: , Rfl:    meclizine (ANTIVERT) 25 MG tablet, Take 1 tablet (25 mg total) by mouth 3 (three) times daily as needed for dizziness.,  Disp: 30 tablet, Rfl: 0   MELATONIN PO, Take 3 mg by mouth at bedtime., Disp: , Rfl:    Metamucil Fiber CHEW, Chew 2-3 each by mouth daily., Disp: , Rfl:    Misc Natural Products (GLUCOSAMINE CHONDROITIN ADV PO), Take 1 tablet by mouth 2 (two) times daily., Disp: , Rfl:    Multiple Vitamins-Minerals (MULTIVITAMIN ADULT PO), Take 1 tablet by mouth daily., Disp: , Rfl:    Omega-3 Fatty Acids (FISH OIL PO), Take 1 capsule by mouth daily., Disp: , Rfl:    Probiotic Product (PROBIOTIC PO), Take 1 capsule by mouth at bedtime., Disp: , Rfl:    pyridOXINE (VITAMIN B-6) 50 MG tablet, Take 50 mg by mouth daily., Disp: , Rfl:    rosuvastatin (CRESTOR) 10 MG tablet, Take 1 tablet (10 mg total) by mouth daily., Disp: 90 tablet, Rfl: 3   timolol (TIMOPTIC) 0.5 % ophthalmic solution, Place 1 drop into both eyes 2 (two) times daily. , Disp: , Rfl:    venlafaxine XR (EFFEXOR-XR) 75 MG 24 hr capsule, TAKE 1 CAPSULE BY MOUTH EVERY DAY, Disp: 90 capsule, Rfl: 0    Physical Exam: Blood pressure 118/62, pulse 74, height 5\' 6"  (1.676 m), weight 238 lb (108 kg), SpO2 97%.    Affect appropriate Healthy:  appears stated age HEENT: normal Neck supple with no adenopathy JVP normal no bruits no thyromegaly Lungs clear with no wheezing and good diaphragmatic motion Heart:  S1/S2 no murmur, no rub, gallop or click PMI normal Abdomen: benighn, BS positve, no tenderness, no AAA no bruit.  No HSM or HJR Distal pulses intact with no bruits No edema Neuro non-focal Left knee arthritis    Labs:   Lab Results  Component Value Date   WBC  9.2 02/15/2023   HGB 13.2 02/15/2023   HCT 38.8 02/15/2023   MCV 102.4 (H) 02/15/2023   PLT 318 02/15/2023    No results for input(s): "NA", "K", "CL", "CO2", "BUN", "CREATININE", "CALCIUM", "PROT", "BILITOT", "ALKPHOS", "ALT", "AST", "GLUCOSE" in the last 168 hours.  Invalid input(s): "LABALBU"  No results found for: "CKTOTAL", "CKMB", "CKMBINDEX", "TROPONINI"  Lab Results  Component Value Date   CHOL 195 06/23/2022   CHOL 202 (H) 08/27/2019   CHOL 215 (H) 03/08/2018   Lab Results  Component Value Date   HDL 78.40 06/23/2022   HDL 63.70 08/27/2019   HDL 62.80 03/08/2018   Lab Results  Component Value Date   LDLCALC 80 06/23/2022   LDLCALC 107 (H) 08/27/2019   LDLCALC 94 08/10/2016   Lab Results  Component Value Date   TRIG 183.0 (H) 06/23/2022   TRIG 155.0 (H) 08/27/2019   TRIG 208.0 (H) 03/08/2018   Lab Results  Component Value Date   CHOLHDL 2 06/23/2022   CHOLHDL 3 08/27/2019   CHOLHDL 3 03/08/2018   Lab Results  Component Value Date   LDLDIRECT 133.0 03/08/2018   LDLDIRECT 237.0 05/11/2016   LDLDIRECT 105.4 02/28/2013      Radiology: ECHOCARDIOGRAM COMPLETE  Result Date: 02/17/2023    ECHOCARDIOGRAM REPORT   Patient Name:   Rien Mcgurl Date of Exam: 02/17/2023 Medical Rec #:  [id number]              Height:       66.0 in Accession #:    [id number]             Weight:       237.0 lb Date of Birth:  1945/10/10  BSA:          2.149 m Patient Age:    76 years               BP:           124/70 mmHg Patient Gender: F                      HR:           59 bpm. Exam Location:  [address] Procedure: 2D Echo, Cardiac Doppler, Color Doppler and Intracardiac            Opacification Agent Indications:    I42.1 HOCM  History:        Patient has no prior history of Echocardiogram examinations.                 Hypertrophic Cardiomyopathy; Risk Factors:Family History of                 Coronary Artery Disease, Hypertension, Dyslipidemia and Former                  Smoker. Suspected HOCM with Apical Ballooning, History of                 Pulmonary Embolism, Pre-Operative Eval for Knee Replacement.  Sonographer:    Farrel Conners RDCS Referring Phys: Deeann Saint IMPRESSIONS  1. Left ventricular ejection fraction, by estimation, is 60 to 65%. The left ventricle has normal function. The left ventricle demonstrates regional wall motion abnormalities with a small dyskinetic pouch at the true apex. There is asymmetric moderate-severe apical hypertrophy with the small apical dyskinetic pouch seen best in the 4-chamber view. There is a mid-cavity LV gradient, peak around 30 mmHg. Left ventricular diastolic parameters are consistent with Grade I diastolic dysfunction (impaired relaxation). 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 Consider cardiac MRI for delayed enhancement evaluation/risk stratification.  2. Right ventricular systolic function is mildly reduced. The right ventricular size is normal. Tricuspid regurgitation signal is inadequate for assessing PA pressure.  3. Left atrial size was moderately dilated.  4. The mitral valve is normal in structure. Trivial mitral valve regurgitation. No evidence of mitral stenosis.  5. The aortic valve is tricuspid. There is mild calcification of the aortic valve. Aortic valve regurgitation is mild. Aortic valve sclerosis/calcification is present, without any evidence of aortic stenosis.  6. The inferior vena cava is normal in size with greater than 50% respiratory variability, suggesting right atrial pressure of 3 mmHg.  7. Cystic structure in liver measuring 5.5 x 4.3 cm, consider dedicated ultrasound. FINDINGS  Left Ventricle: Left ventricular ejection fraction, by estimation, is 60 to 65%. The left ventricle has normal function. The left ventricle demonstrates regional wall motion abnormalities. Definity contrast agent was given IV to delineate the left ventricular endocardial  borders. The left ventricular internal cavity size was normal in size. There is asymmetric moderate-severe apical hypertrophy with a small apical dyskinetic pouch seen best in the 4-chamber view left ventricular hypertrophy. Left ventricular diastolic parameters are consistent with Grade I diastolic dysfunction (impaired relaxation). Right Ventricle: The right ventricular size is normal. No increase in right ventricular wall thickness. Right ventricular systolic function is mildly reduced. Tricuspid regurgitation signal is inadequate for assessing PA pressure. Left Atrium: Left atrial size was moderately dilated. Right Atrium: Right atrial size was normal in size. Pericardium: Trivial pericardial effusion is present. Mitral Valve: The mitral valve is normal in  structure. Trivial mitral valve regurgitation. No evidence of mitral valve stenosis. Tricuspid Valve: The tricuspid valve is normal in structure. Tricuspid valve regurgitation is trivial. Aortic Valve: The aortic valve is tricuspid. There is mild calcification of the aortic valve. Aortic valve regurgitation is mild. Aortic regurgitation PHT measures 562 msec. Aortic valve sclerosis/calcification is present, without any evidence of aortic stenosis. Aortic valve mean gradient measures 8.0 mmHg. Aortic valve peak gradient measures 14.9 mmHg. Aortic valve area, by VTI measures 2.05 cm. Pulmonic Valve: The pulmonic valve was normal in structure. Pulmonic valve regurgitation is trivial. Aorta: The aortic root is normal in size and structure. Venous: The inferior vena cava is normal in size with greater than 50% respiratory variability, suggesting right atrial pressure of 3 mmHg. IAS/Shunts: No atrial level shunt detected by color flow Doppler.  LEFT VENTRICLE PLAX 2D LVIDd:         5.60 cm   Diastology LVIDs:         3.00 cm   LV e' medial:    5.72 cm/s LV PW:         1.30 cm   LV E/e' medial:  14.6 LV IVS:        1.10 cm   LV e' lateral:   8.58 cm/s LVOT diam:      2.10 cm   LV E/e' lateral: 9.8 LV SV:         94 LV SV Index:   44 LVOT Area:     3.46 cm  RIGHT VENTRICLE RV Basal diam:  3.30 cm RV S prime:     17.70 cm/s TAPSE (M-mode): 2.4 cm LEFT ATRIUM              Index        RIGHT ATRIUM           Index LA diam:        5.50 cm  2.56 cm/m   RA Pressure: 3.00 mmHg LA Vol (A2C):   89.2 ml  41.50 ml/m  RA Area:     13.40 cm LA Vol (A4C):   111.0 ml 51.64 ml/m  RA Volume:   32.40 ml  15.07 ml/m LA Biplane Vol: 101.0 ml 46.99 ml/m  AORTIC VALVE AV Area (Vmax):    1.97 cm AV Area (Vmean):   1.97 cm AV Area (VTI):     2.05 cm AV Vmax:           193.00 cm/s AV Vmean:          126.000 cm/s AV VTI:            0.456 m AV Peak Grad:      14.9 mmHg AV Mean Grad:      8.0 mmHg LVOT Vmax:         110.00 cm/s LVOT Vmean:        71.800 cm/s LVOT VTI:          0.270 m LVOT/AV VTI ratio: 0.59 AI PHT:            562 msec  AORTA Ao Root diam: 3.50 cm Ao Asc diam:  3.50 cm MITRAL VALVE               TRICUSPID VALVE MV Area (PHT): cm         Estimated RAP:  3.00 mmHg MV Decel Time: 190 msec MV E velocity: 83.70 cm/s  SHUNTS MV A velocity: 86.55 cm/s  Systemic VTI:  0.27 m MV E/A ratio:  0.97        Systemic Diam: 2.10 cm Dalton McleanMD Electronically signed by Wilfred Lacy Signature Date/Time: 02/17/2023/12:53:52 PM    Final     EKG: see HPI   ASSESSMENT AND PLAN:   Preoperative:  clear to have surgery Monday Avoid dehydration  Apical Hypertrophic Cardiomyopathy:  ECG is consistent with this and not ischemia stable since 2013 and normal cath , calcium score 0 in past. Will get cardiac MRI to further asses apical defect and scar burden in regard to arrhythmic substrate She will need 5 mg valium before study and knowsn to get a driver to transport her  HTN:  continue norvasc HLD:  continue crestor LDL 80 reasonable in setting of no CAD and calcium score 0 in past update calcium score Pre DM:  A1c 5.9 low carb diet  Depression :  on Effexor mood appropriate   Cardiac  MRI Calcium score Both to be done after knee surgery   F/U in 3 months  Signed: Charlton Haws 02/22/2023, 2:33 PM

## 2023-02-17 ENCOUNTER — Ambulatory Visit (HOSPITAL_COMMUNITY): Payer: PPO | Attending: Cardiology

## 2023-02-17 DIAGNOSIS — I421 Obstructive hypertrophic cardiomyopathy: Secondary | ICD-10-CM | POA: Diagnosis not present

## 2023-02-17 DIAGNOSIS — M9901 Segmental and somatic dysfunction of cervical region: Secondary | ICD-10-CM | POA: Diagnosis not present

## 2023-02-17 DIAGNOSIS — Z01818 Encounter for other preprocedural examination: Secondary | ICD-10-CM | POA: Insufficient documentation

## 2023-02-17 DIAGNOSIS — M9903 Segmental and somatic dysfunction of lumbar region: Secondary | ICD-10-CM | POA: Diagnosis not present

## 2023-02-17 DIAGNOSIS — M62830 Muscle spasm of back: Secondary | ICD-10-CM | POA: Diagnosis not present

## 2023-02-17 DIAGNOSIS — M542 Cervicalgia: Secondary | ICD-10-CM | POA: Diagnosis not present

## 2023-02-17 LAB — ECHOCARDIOGRAM COMPLETE
AR max vel: 1.97 cm2
AV Area VTI: 2.05 cm2
AV Area mean vel: 1.97 cm2
AV Mean grad: 8 mm[Hg]
AV Peak grad: 14.9 mm[Hg]
Ao pk vel: 1.93 m/s
Area-P 1/2: 3.99 cm2
P 1/2 time: 562 ms
S' Lateral: 3 cm

## 2023-02-17 MED ORDER — PERFLUTREN LIPID MICROSPHERE
1.0000 mL | INTRAVENOUS | Status: AC | PRN
Start: 2023-02-17 — End: 2023-02-17
  Administered 2023-02-17: 3 mL via INTRAVENOUS

## 2023-02-17 NOTE — Telephone Encounter (Signed)
-----   Message from Charlton Haws sent at 02/16/2023  5:47 PM EDT ----- Preop clearance new to me next Tuesday. Needs echo with definity to r/o apical HOCM with ballooning use definity would be nice to do before visit

## 2023-02-17 NOTE — Telephone Encounter (Signed)
Placed order for echo.

## 2023-02-22 ENCOUNTER — Ambulatory Visit: Payer: PPO | Attending: Cardiovascular Disease | Admitting: Cardiovascular Disease

## 2023-02-22 ENCOUNTER — Encounter: Payer: Self-pay | Admitting: Cardiovascular Disease

## 2023-02-22 VITALS — BP 118/62 | HR 74 | Ht 66.0 in | Wt 238.0 lb

## 2023-02-22 DIAGNOSIS — I10 Essential (primary) hypertension: Secondary | ICD-10-CM | POA: Diagnosis not present

## 2023-02-22 DIAGNOSIS — I422 Other hypertrophic cardiomyopathy: Secondary | ICD-10-CM | POA: Diagnosis not present

## 2023-02-22 DIAGNOSIS — I421 Obstructive hypertrophic cardiomyopathy: Secondary | ICD-10-CM

## 2023-02-22 DIAGNOSIS — M9901 Segmental and somatic dysfunction of cervical region: Secondary | ICD-10-CM | POA: Diagnosis not present

## 2023-02-22 DIAGNOSIS — Z01818 Encounter for other preprocedural examination: Secondary | ICD-10-CM

## 2023-02-22 DIAGNOSIS — I253 Aneurysm of heart: Secondary | ICD-10-CM

## 2023-02-22 DIAGNOSIS — M62830 Muscle spasm of back: Secondary | ICD-10-CM | POA: Diagnosis not present

## 2023-02-22 DIAGNOSIS — M542 Cervicalgia: Secondary | ICD-10-CM | POA: Diagnosis not present

## 2023-02-22 DIAGNOSIS — M9903 Segmental and somatic dysfunction of lumbar region: Secondary | ICD-10-CM | POA: Diagnosis not present

## 2023-02-22 MED ORDER — DIAZEPAM 5 MG PO TABS: ORAL_TABLET | ORAL | 2 refills | Status: DC

## 2023-02-22 NOTE — Patient Instructions (Addendum)
Medication Instructions:  Your physician recommends that you continue on your current medications as directed. Please refer to the Current Medication list given to you today.  *If you need a refill on your cardiac medications before your next appointment, please call your pharmacy*  Lab Work: If you have labs (blood work) drawn today and your tests are completely normal, you will receive your results only by: MyChart Message (if you have MyChart) OR A paper copy in the mail If you have any lab test that is abnormal or we need to change your treatment, we will call you to review the results.  Testing/Procedures: Your physician has requested that you have a cardiac MRI in 1 month. Cardiac MRI uses a computer to create images of your heart as its beating, producing both still and moving pictures of your heart and major blood vessels. For further information please visit [email or web address]. Please follow the instruction sheet given to you today for more information.   Your physician has requested that you have CT for calcium score in 1 month. Cardiac computed tomography (CT) is a painless test that uses an x-ray machine to take clear, detailed pictures of your heart. For further information please visit [email or web address] Please follow instruction sheet as given.  Follow-Up: At [facility], you and your health needs are our priority.  As part of our continuing mission to provide you with exceptional heart care, we have created designated Provider Care Teams.  These Care Teams include your primary Cardiologist (physician) and Advanced Practice Providers (APPs -  Physician Assistants and Nurse Practitioners) who all work together to provide you with the care you need, when you need it.  We recommend signing up for the patient portal called "MyChart".  Sign up information is provided on this After Visit Summary.  MyChart is used to connect with patients for Virtual Visits (Telemedicine).   Patients are able to view lab/test results, encounter notes, upcoming appointments, etc.  Non-urgent messages can be sent to your provider as well.   To learn more about what you can do with MyChart, go to [email or web address].    Your next appointment:   4 month(s)  Provider:   Charlton Haws, MD

## 2023-02-23 NOTE — Anesthesia Preprocedure Evaluation (Addendum)
Anesthesia Evaluation  Patient identified by MRN, date of birth, ID band Patient awake    Reviewed: Allergy & Precautions, NPO status , Patient's Chart, lab work & pertinent test results  History of Anesthesia Complications (+) PONV and history of anesthetic complications  Airway Mallampati: III  TM Distance: >3 FB Neck ROM: Full    Dental  (+) Teeth Intact, Dental Advisory Given   Pulmonary former smoker   breath sounds clear to auscultation       Cardiovascular hypertension, Pt. on medications  Rhythm:Regular Rate:Normal  Echo: 1. Left ventricular ejection fraction, by estimation, is 60 to 65%. The  left ventricle has normal function. The left ventricle demonstrates  regional wall motion abnormalities with a small dyskinetic pouch at the  true apex. There is asymmetric  moderate-severe apical hypertrophy with the small apical dyskinetic pouch  seen best in the 4-chamber view. There is a mid-cavity LV gradient, peak  around 30 mmHg. Left ventricular diastolic parameters are consistent with  Grade I diastolic dysfunction  (impaired relaxation). This picture is most consistent with apical variant  hypertrophic cardiomyopathy. Consider cardiac MRI for delayed enhancement  evaluation/risk stratification.   2. Right ventricular systolic function is mildly reduced. The right  ventricular size is normal. Tricuspid regurgitation signal is inadequate  for assessing PA pressure.   3. Left atrial size was moderately dilated.   4. The mitral valve is normal in structure. Trivial mitral valve  regurgitation. No evidence of mitral stenosis.   5. The aortic valve is tricuspid. There is mild calcification of the  aortic valve. Aortic valve regurgitation is mild. Aortic valve  sclerosis/calcification is present, without any evidence of aortic  stenosis.   6. The inferior vena cava is normal in size with greater than 50%  respiratory  variability, suggesting right atrial pressure of 3 mmHg.   7. Cystic structure in liver measuring 5.5 x 4.3 cm, consider dedicated  ultrasound.      Neuro/Psych  PSYCHIATRIC DISORDERS  Depression     Neuromuscular disease    GI/Hepatic negative GI ROS, Neg liver ROS,,,  Endo/Other  negative endocrine ROS    Renal/GU negative Renal ROS     Musculoskeletal  (+) Arthritis ,    Abdominal   Peds  Hematology negative hematology ROS (+)   Anesthesia Other Findings - HLD  Reproductive/Obstetrics                             Anesthesia Physical Anesthesia Plan  ASA: 3  Anesthesia Plan: Spinal   Post-op Pain Management: Regional block*   Induction: Intravenous  PONV Risk Score and Plan: 4 or greater and Ondansetron, Dexamethasone, Midazolam, Treatment may vary due to age or medical condition and Propofol infusion  Airway Management Planned: Natural Airway and Nasal Cannula  Additional Equipment: None  Intra-op Plan:   Post-operative Plan:   Informed Consent: I have reviewed the patients History and Physical, chart, labs and discussed the procedure including the risks, benefits and alternatives for the proposed anesthesia with the patient or authorized representative who has indicated his/her understanding and acceptance.       Plan Discussed with: CRNA  Anesthesia Plan Comments: (See PAT note from 10/15 by Sherlie Ban PA-C  Lab Results      Component                Value  Date                      WBC                      9.2                 02/15/2023                HGB                      13.2                02/15/2023                HCT                      38.8                02/15/2023                MCV                      102.4 (H)           02/15/2023                PLT                      318                 02/15/2023           )        Anesthesia Quick Evaluation

## 2023-02-24 ENCOUNTER — Other Ambulatory Visit: Payer: Self-pay | Admitting: Family Medicine

## 2023-02-24 DIAGNOSIS — M9903 Segmental and somatic dysfunction of lumbar region: Secondary | ICD-10-CM | POA: Diagnosis not present

## 2023-02-24 DIAGNOSIS — F339 Major depressive disorder, recurrent, unspecified: Secondary | ICD-10-CM

## 2023-02-24 DIAGNOSIS — M62830 Muscle spasm of back: Secondary | ICD-10-CM | POA: Diagnosis not present

## 2023-02-24 DIAGNOSIS — M9901 Segmental and somatic dysfunction of cervical region: Secondary | ICD-10-CM | POA: Diagnosis not present

## 2023-02-24 DIAGNOSIS — M542 Cervicalgia: Secondary | ICD-10-CM | POA: Diagnosis not present

## 2023-02-28 ENCOUNTER — Encounter (HOSPITAL_COMMUNITY): Payer: Self-pay | Admitting: Orthopedic Surgery

## 2023-02-28 ENCOUNTER — Encounter (HOSPITAL_COMMUNITY)
Admission: RE | Disposition: A | Discharge status: Home / Self Care | Payer: Self-pay | Source: Ambulatory Visit | Attending: Orthopedic Surgery

## 2023-02-28 ENCOUNTER — Ambulatory Visit (HOSPITAL_COMMUNITY): Payer: PPO | Admitting: Anesthesiology

## 2023-02-28 ENCOUNTER — Ambulatory Visit (HOSPITAL_COMMUNITY): Payer: PPO | Admitting: Medical

## 2023-02-28 ENCOUNTER — Other Ambulatory Visit: Payer: Self-pay

## 2023-02-28 ENCOUNTER — Observation Stay (HOSPITAL_COMMUNITY)
Admission: RE | Admit: 2023-02-28 | Discharge: 2023-03-01 | Disposition: A | Discharge status: Home / Self Care | Payer: PPO | Source: Ambulatory Visit | Attending: Orthopedic Surgery | Admitting: Orthopedic Surgery

## 2023-02-28 DIAGNOSIS — Z7982 Long term (current) use of aspirin: Secondary | ICD-10-CM | POA: Insufficient documentation

## 2023-02-28 DIAGNOSIS — Z79899 Other long term (current) drug therapy: Secondary | ICD-10-CM | POA: Diagnosis not present

## 2023-02-28 DIAGNOSIS — M1712 Unilateral primary osteoarthritis, left knee: Secondary | ICD-10-CM | POA: Diagnosis not present

## 2023-02-28 DIAGNOSIS — Z96651 Presence of right artificial knee joint: Secondary | ICD-10-CM | POA: Diagnosis not present

## 2023-02-28 DIAGNOSIS — I10 Essential (primary) hypertension: Secondary | ICD-10-CM | POA: Insufficient documentation

## 2023-02-28 DIAGNOSIS — Z86711 Personal history of pulmonary embolism: Secondary | ICD-10-CM | POA: Diagnosis not present

## 2023-02-28 DIAGNOSIS — Z87891 Personal history of nicotine dependence: Secondary | ICD-10-CM | POA: Insufficient documentation

## 2023-02-28 DIAGNOSIS — G8918 Other acute postprocedural pain: Secondary | ICD-10-CM | POA: Diagnosis not present

## 2023-02-28 DIAGNOSIS — M179 Osteoarthritis of knee, unspecified: Principal | ICD-10-CM | POA: Diagnosis present

## 2023-02-28 HISTORY — PX: TOTAL KNEE ARTHROPLASTY: SHX125

## 2023-02-28 SURGERY — ARTHROPLASTY, KNEE, TOTAL
Anesthesia: Spinal | Site: Knee | Laterality: Left

## 2023-02-28 MED ORDER — VENLAFAXINE HCL ER 75 MG PO CP24
75.0000 mg | ORAL_CAPSULE | Freq: Every day | ORAL | Status: DC
  Administered 2023-03-01: 75 mg via ORAL
  Filled 2023-02-28: qty 1

## 2023-02-28 MED ORDER — ACETAMINOPHEN 325 MG PO TABS
325.0000 mg | ORAL_TABLET | Freq: Once | ORAL | Status: DC | PRN
Start: 2023-02-28 — End: 2023-02-28

## 2023-02-28 MED ORDER — BISACODYL 10 MG RE SUPP: 10.0000 mg | Freq: Every day | RECTAL | Status: DC | PRN

## 2023-02-28 MED ORDER — ROSUVASTATIN CALCIUM 10 MG PO TABS
10.0000 mg | ORAL_TABLET | Freq: Every day | ORAL | Status: DC
  Administered 2023-03-01: 10 mg via ORAL
  Filled 2023-02-28: qty 1

## 2023-02-28 MED ORDER — SODIUM CHLORIDE (PF) 0.9 % IJ SOLN
INTRAMUSCULAR | Status: DC | PRN
  Administered 2023-02-28: 60 mL

## 2023-02-28 MED ORDER — CEFAZOLIN SODIUM-DEXTROSE 2-4 GM/100ML-% IV SOLN: 2.0000 g | Freq: Four times a day (QID) | INTRAVENOUS | Status: DC

## 2023-02-28 MED ORDER — CEFAZOLIN SODIUM-DEXTROSE 2-4 GM/100ML-% IV SOLN
2.0000 g | INTRAVENOUS | Status: DC
  Filled 2023-02-28: qty 100

## 2023-02-28 MED ORDER — MENTHOL 3 MG MT LOZG: 1.0000 | LOZENGE | OROMUCOSAL | Status: DC | PRN

## 2023-02-28 MED ORDER — METHOCARBAMOL 1000 MG/10ML IJ SOLN: 500.0000 mg | Freq: Four times a day (QID) | INTRAMUSCULAR | Status: DC | PRN

## 2023-02-28 MED ORDER — VANCOMYCIN HCL 1500 MG/300ML IV SOLN
INTRAVENOUS | Status: AC
  Filled 2023-02-28: qty 300

## 2023-02-28 MED ORDER — LACTATED RINGERS IV SOLN: INTRAVENOUS | Status: DC

## 2023-02-28 MED ORDER — HYDROCHLOROTHIAZIDE 25 MG PO TABS
25.0000 mg | ORAL_TABLET | Freq: Every day | ORAL | Status: DC
  Administered 2023-03-01: 25 mg via ORAL
  Filled 2023-02-28: qty 1

## 2023-02-28 MED ORDER — CHLORHEXIDINE GLUCONATE 0.12 % MT SOLN
15.0000 mL | Freq: Once | OROMUCOSAL | Status: AC
  Administered 2023-02-28: 15 mL via OROMUCOSAL

## 2023-02-28 MED ORDER — MEPERIDINE HCL 50 MG/ML IJ SOLN: 6.2500 mg | INTRAMUSCULAR | Status: DC | PRN

## 2023-02-28 MED ORDER — FLEET ENEMA RE ENEM: 1.0000 | ENEMA | Freq: Once | RECTAL | Status: DC | PRN

## 2023-02-28 MED ORDER — PROPOFOL 500 MG/50ML IV EMUL
INTRAVENOUS | Status: DC | PRN
  Administered 2023-02-28: 85 ug/kg/min via INTRAVENOUS

## 2023-02-28 MED ORDER — AMLODIPINE BESYLATE 10 MG PO TABS
10.0000 mg | ORAL_TABLET | Freq: Every day | ORAL | Status: DC
  Administered 2023-03-01: 10 mg via ORAL
  Filled 2023-02-28: qty 1

## 2023-02-28 MED ORDER — OXYCODONE HCL 5 MG PO TABS
5.0000 mg | ORAL_TABLET | ORAL | Status: DC | PRN
  Administered 2023-02-28 (×2): 10 mg via ORAL
  Filled 2023-02-28 (×2): qty 2

## 2023-02-28 MED ORDER — STERILE WATER FOR IRRIGATION IR SOLN
Status: DC | PRN
  Administered 2023-02-28: 1000 mL

## 2023-02-28 MED ORDER — TRANEXAMIC ACID 1000 MG/10ML IV SOLN
INTRAVENOUS | Status: DC | PRN
  Administered 2023-02-28: 2000 mg via TOPICAL

## 2023-02-28 MED ORDER — MIDAZOLAM HCL 2 MG/2ML IJ SOLN
INTRAMUSCULAR | Status: AC
  Administered 2023-02-28: 2 mg
  Filled 2023-02-28: qty 2

## 2023-02-28 MED ORDER — DROPERIDOL 2.5 MG/ML IJ SOLN: 0.6250 mg | Freq: Once | INTRAMUSCULAR | Status: DC | PRN

## 2023-02-28 MED ORDER — ACETAMINOPHEN 500 MG PO TABS
1000.0000 mg | ORAL_TABLET | Freq: Four times a day (QID) | ORAL | Status: AC
  Administered 2023-02-28 – 2023-03-01 (×4): 1000 mg via ORAL
  Filled 2023-02-28 (×4): qty 2

## 2023-02-28 MED ORDER — BUPIVACAINE LIPOSOME 1.3 % IJ SUSP
INTRAMUSCULAR | Status: DC | PRN
  Administered 2023-02-28: 20 mL

## 2023-02-28 MED ORDER — ROPIVACAINE HCL 5 MG/ML IJ SOLN
INTRAMUSCULAR | Status: DC | PRN
  Administered 2023-02-28: 30 mL via PERINEURAL

## 2023-02-28 MED ORDER — METOCLOPRAMIDE HCL 5 MG PO TABS: 5.0000 mg | ORAL_TABLET | Freq: Three times a day (TID) | ORAL | Status: DC | PRN

## 2023-02-28 MED ORDER — VANCOMYCIN HCL 1000 MG IV SOLR
INTRAVENOUS | Status: DC | PRN
  Administered 2023-02-28: 1500 mg via INTRAVENOUS

## 2023-02-28 MED ORDER — ACETAMINOPHEN 325 MG PO TABS: 325.0000 mg | ORAL_TABLET | Freq: Four times a day (QID) | ORAL | Status: DC | PRN

## 2023-02-28 MED ORDER — BUPIVACAINE IN DEXTROSE 0.75-8.25 % IT SOLN
INTRATHECAL | Status: DC | PRN
  Administered 2023-02-28: 1.6 mL via INTRATHECAL

## 2023-02-28 MED ORDER — ONDANSETRON HCL 4 MG/2ML IJ SOLN: 4.0000 mg | Freq: Four times a day (QID) | INTRAMUSCULAR | Status: DC | PRN

## 2023-02-28 MED ORDER — MORPHINE SULFATE (PF) 2 MG/ML IV SOLN
1.0000 mg | INTRAVENOUS | Status: DC | PRN
  Administered 2023-02-28: 1 mg via INTRAVENOUS
  Filled 2023-02-28: qty 1

## 2023-02-28 MED ORDER — VANCOMYCIN HCL IN DEXTROSE 1-5 GM/200ML-% IV SOLN
1000.0000 mg | Freq: Once | INTRAVENOUS | Status: AC
  Administered 2023-02-28: 1000 mg via INTRAVENOUS
  Filled 2023-02-28: qty 200

## 2023-02-28 MED ORDER — ONDANSETRON HCL 4 MG/2ML IJ SOLN
INTRAMUSCULAR | Status: DC | PRN
  Administered 2023-02-28: 4 mg via INTRAVENOUS

## 2023-02-28 MED ORDER — ORAL CARE MOUTH RINSE: 15.0000 mL | Freq: Once | OROMUCOSAL | Status: AC

## 2023-02-28 MED ORDER — HYDROMORPHONE HCL 1 MG/ML IJ SOLN: 0.2500 mg | INTRAMUSCULAR | Status: DC | PRN

## 2023-02-28 MED ORDER — PHENYLEPHRINE HCL-NACL 20-0.9 MG/250ML-% IV SOLN
INTRAVENOUS | Status: DC | PRN
  Administered 2023-02-28: 50 ug/min via INTRAVENOUS

## 2023-02-28 MED ORDER — BUPIVACAINE LIPOSOME 1.3 % IJ SUSP
INTRAMUSCULAR | Status: AC
  Filled 2023-02-28: qty 20

## 2023-02-28 MED ORDER — MIDAZOLAM HCL 2 MG/2ML IJ SOLN: 2.0000 mg | Freq: Once | INTRAMUSCULAR | Status: DC

## 2023-02-28 MED ORDER — SODIUM CHLORIDE (PF) 0.9 % IJ SOLN
INTRAMUSCULAR | Status: AC
  Filled 2023-02-28: qty 10

## 2023-02-28 MED ORDER — BUPIVACAINE LIPOSOME 1.3 % IJ SUSP: 20.0000 mL | Freq: Once | INTRAMUSCULAR | Status: DC

## 2023-02-28 MED ORDER — PHENOL 1.4 % MT LIQD: 1.0000 | OROMUCOSAL | Status: DC | PRN

## 2023-02-28 MED ORDER — ACETAMINOPHEN 10 MG/ML IV SOLN: 1000.0000 mg | Freq: Once | INTRAVENOUS | Status: DC | PRN

## 2023-02-28 MED ORDER — ACETAMINOPHEN 160 MG/5ML PO SOLN
325.0000 mg | Freq: Once | ORAL | Status: DC | PRN
Start: 2023-02-28 — End: 2023-02-28

## 2023-02-28 MED ORDER — ONDANSETRON HCL 4 MG/2ML IJ SOLN
INTRAMUSCULAR | Status: AC
  Filled 2023-02-28: qty 2

## 2023-02-28 MED ORDER — MECLIZINE HCL 25 MG PO TABS: 25.0000 mg | ORAL_TABLET | Freq: Three times a day (TID) | ORAL | Status: DC | PRN

## 2023-02-28 MED ORDER — DEXAMETHASONE SODIUM PHOSPHATE 10 MG/ML IJ SOLN
INTRAMUSCULAR | Status: AC
  Filled 2023-02-28: qty 1

## 2023-02-28 MED ORDER — DOCUSATE SODIUM 100 MG PO CAPS
100.0000 mg | ORAL_CAPSULE | Freq: Two times a day (BID) | ORAL | Status: DC
  Administered 2023-02-28 – 2023-03-01 (×2): 100 mg via ORAL
  Filled 2023-02-28 (×2): qty 1

## 2023-02-28 MED ORDER — METOCLOPRAMIDE HCL 5 MG/ML IJ SOLN: 5.0000 mg | Freq: Three times a day (TID) | INTRAMUSCULAR | Status: DC | PRN

## 2023-02-28 MED ORDER — DEXAMETHASONE SODIUM PHOSPHATE 10 MG/ML IJ SOLN
INTRAMUSCULAR | Status: DC | PRN
  Administered 2023-02-28: 10 mg via INTRAVENOUS

## 2023-02-28 MED ORDER — SODIUM CHLORIDE (PF) 0.9 % IJ SOLN
INTRAMUSCULAR | Status: AC
  Filled 2023-02-28: qty 50

## 2023-02-28 MED ORDER — PHENYLEPHRINE HCL-NACL 20-0.9 MG/250ML-% IV SOLN
INTRAVENOUS | Status: AC
  Filled 2023-02-28: qty 250

## 2023-02-28 MED ORDER — ONDANSETRON HCL 4 MG PO TABS: 4.0000 mg | ORAL_TABLET | Freq: Four times a day (QID) | ORAL | Status: DC | PRN

## 2023-02-28 MED ORDER — 0.9 % SODIUM CHLORIDE (POUR BTL) OPTIME
TOPICAL | Status: DC | PRN
  Administered 2023-02-28: 1000 mL

## 2023-02-28 MED ORDER — DIPHENHYDRAMINE HCL 12.5 MG/5ML PO ELIX: 12.5000 mg | ORAL_SOLUTION | ORAL | Status: DC | PRN

## 2023-02-28 MED ORDER — METHOCARBAMOL 500 MG PO TABS
500.0000 mg | ORAL_TABLET | Freq: Four times a day (QID) | ORAL | Status: DC | PRN
  Administered 2023-03-01: 500 mg via ORAL
  Filled 2023-02-28: qty 1

## 2023-02-28 MED ORDER — SODIUM CHLORIDE 0.9 % IV SOLN: INTRAVENOUS | Status: DC

## 2023-02-28 MED ORDER — TRANEXAMIC ACID 1000 MG/10ML IV SOLN
2000.0000 mg | Freq: Once | INTRAVENOUS | Status: DC
  Filled 2023-02-28: qty 20

## 2023-02-28 MED ORDER — DEXAMETHASONE SODIUM PHOSPHATE 10 MG/ML IJ SOLN: 8.0000 mg | Freq: Once | INTRAMUSCULAR | Status: DC

## 2023-02-28 MED ORDER — ACETAMINOPHEN 10 MG/ML IV SOLN
1000.0000 mg | Freq: Four times a day (QID) | INTRAVENOUS | Status: DC
  Filled 2023-02-28: qty 100

## 2023-02-28 MED ORDER — RIVAROXABAN 10 MG PO TABS
10.0000 mg | ORAL_TABLET | Freq: Every day | ORAL | Status: DC
  Administered 2023-03-01: 10 mg via ORAL
  Filled 2023-02-28: qty 1

## 2023-02-28 MED ORDER — SODIUM CHLORIDE 0.9% FLUSH: 10.0000 mL | Freq: Two times a day (BID) | INTRAVENOUS | Status: DC

## 2023-02-28 MED ORDER — POLYETHYLENE GLYCOL 3350 17 G PO PACK
17.0000 g | PACK | Freq: Every day | ORAL | Status: DC | PRN
Start: 2023-02-28 — End: 2023-03-01

## 2023-02-28 MED ORDER — SODIUM CHLORIDE 0.9 % IR SOLN
Status: DC | PRN
  Administered 2023-02-28: 1

## 2023-02-28 MED ORDER — OXYCODONE HCL 5 MG PO TABS
10.0000 mg | ORAL_TABLET | ORAL | Status: DC | PRN
  Administered 2023-03-01: 15 mg via ORAL
  Filled 2023-02-28: qty 3

## 2023-02-28 MED ORDER — FENTANYL CITRATE PF 50 MCG/ML IJ SOSY
100.0000 ug | PREFILLED_SYRINGE | Freq: Once | INTRAMUSCULAR | Status: AC
  Administered 2023-02-28: 50 ug via INTRAVENOUS
  Filled 2023-02-28: qty 2

## 2023-02-28 MED ORDER — POVIDONE-IODINE 10 % EX SWAB: 2.0000 | Freq: Once | CUTANEOUS | Status: DC

## 2023-02-28 MED ORDER — DEXAMETHASONE SODIUM PHOSPHATE 10 MG/ML IJ SOLN
10.0000 mg | Freq: Once | INTRAMUSCULAR | Status: AC
  Administered 2023-03-01: 10 mg via INTRAVENOUS
  Filled 2023-02-28: qty 1

## 2023-02-28 SURGICAL SUPPLY — 56 items
ADH SKN CLS APL DERMABOND .7 (GAUZE/BANDAGES/DRESSINGS) ×1
ARTISURF 10M VEL 10-11 EF KNEE (Knees) IMPLANT
BAG COUNTER SPONGE SURGICOUNT (BAG) IMPLANT
BAG SPEC THK2 15X12 ZIP CLS (MISCELLANEOUS) ×1
BAG SPNG CNTER NS LX DISP (BAG)
BAG ZIPLOCK 12X15 (MISCELLANEOUS) ×1 IMPLANT
BLADE SAG 18X100X1.27 (BLADE) ×1 IMPLANT
BLADE SAW SGTL 11.0X1.19X90.0M (BLADE) ×1 IMPLANT
BNDG CMPR 6 X 5 YARDS HK CLSR (GAUZE/BANDAGES/DRESSINGS) ×1
BNDG ELASTIC 6INX 5YD STR LF (GAUZE/BANDAGES/DRESSINGS) ×1 IMPLANT
BOWL SMART MIX CTS (DISPOSABLE) ×1 IMPLANT
BSPLAT TIB 5D E CMNT STM LT (Knees) ×1 IMPLANT
CEMENT HV SMART SET (Cement) ×2 IMPLANT
COMP FEM CMT PS STD 10 LT (Joint) ×1 IMPLANT
COMPONENT FEM CMT PS STD 10 LT (Joint) IMPLANT
COVER SURGICAL LIGHT HANDLE (MISCELLANEOUS) ×1 IMPLANT
CUFF TOURN SGL QUICK 34 (TOURNIQUET CUFF) ×1
CUFF TRNQT CYL 34X4.125X (TOURNIQUET CUFF) ×1 IMPLANT
DERMABOND ADVANCED .7 DNX12 (GAUZE/BANDAGES/DRESSINGS) ×1 IMPLANT
DRAPE U-SHAPE 47X51 STRL (DRAPES) ×1 IMPLANT
DRSG AQUACEL AG ADV 3.5X10 (GAUZE/BANDAGES/DRESSINGS) ×1 IMPLANT
DURAPREP 26ML APPLICATOR (WOUND CARE) ×1 IMPLANT
ELECT REM PT RETURN 15FT ADLT (MISCELLANEOUS) ×1 IMPLANT
GLOVE BIO SURGEON STRL SZ 6.5 (GLOVE) IMPLANT
GLOVE BIO SURGEON STRL SZ8 (GLOVE) ×1 IMPLANT
GLOVE BIOGEL PI IND STRL 6.5 (GLOVE) IMPLANT
GLOVE BIOGEL PI IND STRL 7.0 (GLOVE) IMPLANT
GLOVE BIOGEL PI IND STRL 8 (GLOVE) ×1 IMPLANT
GOWN STRL REUS W/ TWL LRG LVL3 (GOWN DISPOSABLE) ×1 IMPLANT
GOWN STRL REUS W/TWL LRG LVL3 (GOWN DISPOSABLE) ×1
HANDPIECE INTERPULSE COAX TIP (DISPOSABLE) ×1
HOLDER FOLEY CATH W/STRAP (MISCELLANEOUS) IMPLANT
IMMOBILIZER KNEE 20 (SOFTGOODS) ×1
IMMOBILIZER KNEE 20 THIGH 36 (SOFTGOODS) ×1 IMPLANT
KIT TURNOVER KIT A (KITS) IMPLANT
MANIFOLD NEPTUNE II (INSTRUMENTS) ×1 IMPLANT
NS IRRIG 1000ML POUR BTL (IV SOLUTION) ×1 IMPLANT
PACK TOTAL KNEE CUSTOM (KITS) ×1 IMPLANT
PADDING CAST COTTON 6X4 STRL (CAST SUPPLIES) ×2 IMPLANT
PIN DRILL HDLS TROCAR 75 4PK (PIN) IMPLANT
PROTECTOR NERVE ULNAR (MISCELLANEOUS) ×1 IMPLANT
SCREW FEMALE HEX FIX 25X2.5 (ORTHOPEDIC DISPOSABLE SUPPLIES) IMPLANT
SET HNDPC FAN SPRY TIP SCT (DISPOSABLE) ×1 IMPLANT
STEM POLY PAT PLY 35M KNEE (Knees) IMPLANT
STEM TIBIA 5 DEG SZ E L KNEE (Knees) IMPLANT
SUT MNCRL AB 4-0 PS2 18 (SUTURE) ×1 IMPLANT
SUT STRATAFIX 0 PDS 27 VIOLET (SUTURE) ×1
SUT VIC AB 2-0 CT1 27 (SUTURE) ×3
SUT VIC AB 2-0 CT1 TAPERPNT 27 (SUTURE) ×3 IMPLANT
SUTURE STRATFX 0 PDS 27 VIOLET (SUTURE) ×1 IMPLANT
TIBIA STEM 5 DEG SZ E L KNEE (Knees) ×1 IMPLANT
TIP HIGH FLOW IRRIGATION COAX (MISCELLANEOUS) IMPLANT
TRAY FOLEY MTR SLVR 16FR STAT (SET/KITS/TRAYS/PACK) IMPLANT
TUBE SUCTION HIGH CAP CLEAR NV (SUCTIONS) ×1 IMPLANT
WATER STERILE IRR 1000ML POUR (IV SOLUTION) ×2 IMPLANT
WRAP KNEE MAXI GEL POST OP (GAUZE/BANDAGES/DRESSINGS) ×1 IMPLANT

## 2023-02-28 NOTE — Progress Notes (Signed)
Orthopedic Tech Progress Note Patient Details:  Clo Aldi [facility] Apr 19, 1946 [id number]  CPM Left Knee CPM Left Knee: On Left Knee Flexion (Degrees): 40 Left Knee Extension (Degrees): 10  Post Interventions Patient Tolerated: Well  [place] A Gerilyn Pilgrim 02/28/2023, 1:40 PM

## 2023-02-28 NOTE — Anesthesia Postprocedure Evaluation (Signed)
Anesthesia Post Note  Patient: Nance Pear [place]  Procedure(s) Performed: TOTAL KNEE ARTHROPLASTY (Left: Knee)     Patient location during evaluation: PACU Anesthesia Type: Spinal Level of consciousness: oriented and awake and alert Pain management: pain level controlled Vital Signs Assessment: post-procedure vital signs reviewed and stable Respiratory status: spontaneous breathing, respiratory function stable and patient connected to nasal cannula oxygen Cardiovascular status: blood pressure returned to baseline and stable Postop Assessment: no headache, no backache and no apparent nausea or vomiting Anesthetic complications: no  No notable events documented.  Last Vitals:  Vitals:   02/28/23 1537 02/28/23 1724  BP: (!) 147/67 (!) 127/48  Pulse: 67 77  Resp: 16 16  Temp: 36.7 C 36.5 C  SpO2: 93% 92%    Last Pain:  Vitals:   02/28/23 1746  TempSrc:   PainSc: 5                  Shelton Silvas

## 2023-02-28 NOTE — Op Note (Addendum)
OPERATIVE REPORT-TOTAL KNEE ARTHROPLASTY   Pre-operative diagnosis- Osteoarthritis  Left knee(s)  Post-operative diagnosis- Osteoarthritis Left knee(s)  Procedure-  Left  Total Knee Arthroplasty ([employer] secondary to nickel allergy)  Surgeon- Gus Rankin. Chessa Barrasso, MD  Assistant- Arcola Jansky, PA-C   Anesthesia-   Adductor canal block and spinal  EBL-50 mL   Drains None  Tourniquet time- Tourniquet not used  Complications- None  Condition-PACU - hemodynamically stable.   Brief Clinical Note  Susan Jenkins is a 77 y.o. year old female with end stage OA of her left knee with progressively worsening pain and dysfunction. She has constant pain, with activity and at rest and significant functional deficits with difficulties even with ADLs. She has had extensive non-op management including analgesics, injections of cortisone and viscosupplements, and home exercise program, but remains in significant pain with significant dysfunction. Radiographs show bone on bone arthritis medial and patellofemoral. She presents now for left Total Knee Arthroplasty.     Procedure in detail---   The patient is brought into the operating room and positioned supine on the operating table. After successful administration of  Adductor canal block and spinal,   a tourniquet is placed high on the  Left thigh(s) and the lower extremity is prepped and draped in the usual sterile fashion. Time out is performed by the operating team.      A midline incision is made with a ten blade through the subcutaneous tissue to the level of the extensor mechanism. A fresh blade is used to make a medial parapatellar arthrotomy. Soft tissue over the proximal medial tibia is subperiosteally elevated to the joint line with a knife and into the semimembranosus bursa with a Cobb elevator. Soft tissue over the proximal lateral tibia is elevated with attention being paid to avoiding the patellar tendon on the tibial  tubercle. The patella is everted, knee flexed 90 degrees and the ACL and PCL are removed. Findings are bone on bone medial and patellofemoral with large global osteophytes.        The drill is used to create a starting hole in the distal femur and the canal is thoroughly irrigated with sterile saline to remove the fatty contents. The 5 degree Left  valgus alignment guide is placed into the femoral canal and the distal femoral cutting block is pinned to remove 9 mm off the distal femur. Resection is made with an oscillating saw.      The tibia is subluxed forward and the menisci are removed. The extramedullary alignment guide is placed referencing proximally at the medial aspect of the tibial tubercle and distally along the second metatarsal axis and tibial crest. The block is pinned to remove 2mm off the more deficient medial  side. Resection is made with an oscillating saw. Size E is the most appropriate size for the tibia and the proximal tibia is prepared with the modular drill and keel punch for that size.      The femoral sizing guide is placed and size 10 is most appropriate. Rotation is marked off the epicondylar axis and confirmed by creating a rectangular flexion gap at 90 degrees. The size 10 cutting block is pinned in this rotation and the anterior, posterior and chamfer cuts are made with the oscillating saw. The intercondylar block is then placed and that cut is made.      Trial size E tibial component, trial size 10 posterior stabilized femur and a 10  mm posterior stabilized fixed bearing insert trial is placed.  Full extension is achieved with excellent varus/valgus and anterior/posterior balance throughout full range of motion. The patella is everted and thickness measured to be 22  mm. Free hand resection is taken to 12 mm, a 35 template is placed, lug holes are drilled, trial patella is placed, and it tracks normally. Osteophytes are removed off the posterior femur with the trial in place. All  trials are removed and the cut bone surfaces prepared with pulsatile lavage. Cement is mixed and once ready for implantation, the size E tibial implant, size  10 posterior stabilized femoral component, and the size 35 patella are cemented in place and the patella is held with the clamp. The trial insert is placed and the knee held in full extension. The Exparel (20 ml mixed with 60 ml saline) is injected into the extensor mechanism, posterior capsule, medial and lateral gutters and subcutaneous tissues.  All extruded cement is removed and once the cement is hard the permanent 10 mm posterior stabilized fixed bearing insert is placed into the tibial tray.      The wound is copiously irrigated with saline solution and the extensor mechanism closed with # 0 Stratofix suture. Flexion against gravity is 140 degrees and the patella tracks normally. Subcutaneous tissue is closed with 2.0 vicryl and subcuticular with running 4.0 Monocryl. The incision is cleaned and dried and steri-strips and a bulky sterile dressing are applied. The limb is placed into a knee immobilizer and the patient is awakened and transported to recovery in stable condition.      Please note that a surgical assistant was a medical necessity for this procedure in order to perform it in a safe and expeditious manner. Surgical assistant was necessary to retract the ligaments and vital neurovascular structures to prevent injury to them and also necessary for proper positioning of the limb to allow for anatomic placement of the prosthesis.   Gus Rankin Pratik Dalziel, MD    02/28/2023, 12:59 PM  '

## 2023-02-28 NOTE — Anesthesia Procedure Notes (Signed)
Spinal  Start time: 02/28/2023 11:41 AM End time: 02/28/2023 11:43 AM Reason for block: surgical anesthesia Staffing Performed: anesthesiologist  Anesthesiologist: Shelton Silvas, MD Performed by: Shelton Silvas, MD Authorized by: Shelton Silvas, MD   Preanesthetic Checklist Completed: patient identified, IV checked, site marked, risks and benefits discussed, surgical consent, monitors and equipment checked, pre-op evaluation and timeout performed Spinal Block Patient position: sitting Prep: DuraPrep and site prepped and draped Location: L3-4 Injection technique: single-shot Needle Needle type: Pencan  Needle gauge: 24 G Needle length: 10 cm Needle insertion depth: 10 cm Additional Notes Patient tolerated well. No immediate complications.  Functioning IV was confirmed and monitors were applied. Sterile prep and drape, including hand hygiene and sterile gloves were used. The patient was positioned and the back was prepped. The skin was anesthetized with lidocaine. Free flow of clear CSF was obtained prior to injecting local anesthetic into the CSF. The spinal needle aspirated freely following injection. The needle was carefully withdrawn. The patient tolerated the procedure well.

## 2023-02-28 NOTE — Progress Notes (Signed)
Orthopedic Tech Progress Note Patient Details:  Susan Jenkins [facility] 01-Dec-1945 [id number]  CPM Left Knee CPM Left Knee: Off Left Knee Flexion (Degrees): 40 Left Knee Extension (Degrees): 10  Post Interventions Patient Tolerated: Well  Darleen Crocker 02/28/2023, 5:33 PM

## 2023-02-28 NOTE — Interval H&P Note (Signed)
History and Physical Interval Note:  02/28/2023 9:28 AM  Susan Jenkins  has presented today for surgery, with the diagnosis of left knee osteoarthritis.  The various methods of treatment have been discussed with the patient and family. After consideration of risks, benefits and other options for treatment, the patient has consented to  Procedure(s): TOTAL KNEE ARTHROPLASTY (Left) as a surgical intervention.  The patient's history has been reviewed, patient examined, no change in status, stable for surgery.  I have reviewed the patient's chart and labs.  Questions were answered to the patient's satisfaction.     Homero Fellers Pieter Fooks

## 2023-02-28 NOTE — Evaluation (Signed)
Physical Therapy Evaluation Patient Details Name: Susan Jenkins MRN: [id number] DOB: 1945-11-13 Today's Date: 02/28/2023  History of Present Illness  77 yo female presents to therapy s/p L TKA on 02/28/2023 due to failure of conservative measures. Pt PMH includes but is not limited to: HLD, glaucoma, diplopia, HTN, mitral valve prolapse, back pain s/p surgery, ankle surgery, angina, depression, PE, and R TKA (2019).  Clinical Impression      Susan Jenkins is a 77 y.o. female POD 0 s/p L TKA. Patient reports IND with mobility at baseline. Patient is now limited by functional impairments (see PT problem list below) and requires min A for bed mobility and CGA and cues for transfers. Patient was able to ambulate 25 feet with RW and CGA level of assist. Patient instructed in exercise to facilitate ROM and circulation to manage edema.  Patient will benefit from continued skilled PT interventions to address impairments and progress towards PLOF. Acute PT will follow to progress mobility and stair training in preparation for safe discharge home with strong social support and OPPT services scheduled for 10/31.     If plan is discharge home, recommend the following: A little help with walking and/or transfers;A little help with bathing/dressing/bathroom;Assistance with cooking/housework;Assist for transportation;Help with stairs or ramp for entrance   Can travel by private vehicle        Equipment Recommendations None recommended by PT  Recommendations for Other Services       Functional Status Assessment Patient has had a recent decline in their functional status and demonstrates the ability to make significant improvements in function in a reasonable and predictable amount of time.     Precautions / Restrictions Precautions Precautions: Knee;Fall Restrictions Weight Bearing Restrictions: No      Mobility  Bed Mobility Overal bed mobility: Needs Assistance Bed Mobility:  Supine to Sit     Supine to sit: Min assist, HOB elevated, Used rails     General bed mobility comments: min A for L LE initally and pt able to complete supine to sit with CGA    Transfers Overall transfer level: Needs assistance Equipment used: Rolling walker (2 wheels) Transfers: Sit to/from Stand Sit to Stand: Contact guard assist           General transfer comment: min cues    Ambulation/Gait Ambulation/Gait assistance: Contact guard assist Gait Distance (Feet): 25 Feet Assistive device: Rolling walker (2 wheels) Gait Pattern/deviations: Step-to pattern, Antalgic, Trunk flexed Gait velocity: decreased     General Gait Details: step almost to pattern, limited L knee flexion and WB, pt indicated feeling hot during gait tasks and pt directed to recliner  Stairs            Wheelchair Mobility     Tilt Bed    Modified Rankin (Stroke Patients Only)       Balance Overall balance assessment: Needs assistance, History of Falls (1 fall in past 6 months) Sitting-balance support: Feet supported Sitting balance-Leahy Scale: Good     Standing balance support: Bilateral upper extremity supported, During functional activity, Reliant on assistive device for balance Standing balance-Leahy Scale: Poor                               Pertinent Vitals/Pain Pain Assessment Pain Assessment: 0-10 Pain Score: 5  Pain Location: L knee and leg Pain Descriptors / Indicators: Aching, Discomfort, Dull, Grimacing, Operative site guarding Pain Intervention(s): Limited activity within patient's  tolerance, Monitored during session, Premedicated before session, Repositioned, Ice applied    Home Living Family/patient expects to be discharged to:: Private residence Living Arrangements: Alone Available Help at Discharge: Friend(s) Type of Home: House Home Access: Stairs to enter Entrance Stairs-Rails: None Entrance Stairs-Number of Steps: 1   Home Layout: One  level Home Equipment: Agricultural consultant (2 wheels);Cane - single point;BSC/3in1      Prior Function Prior Level of Function : Independent/Modified Independent;Driving             Mobility Comments: IND without AD for all ADLs, self care tasks and IADLs.       Extremity/Trunk Assessment        Lower Extremity Assessment Lower Extremity Assessment: LLE deficits/detail LLE Deficits / Details: ankle DF/PF 5/5; SLR , 10 degree lag LLE Sensation: WNL    Cervical / Trunk Assessment Cervical / Trunk Assessment: Back Surgery  Communication   Communication Communication: No apparent difficulties  Cognition Arousal: Alert Behavior During Therapy: WFL for tasks assessed/performed Overall Cognitive Status: Within Functional Limits for tasks assessed                                          General Comments      Exercises Total Joint Exercises Ankle Circles/Pumps: AROM, Both, 15 reps   Assessment/Plan    PT Assessment Patient needs continued PT services  PT Problem List Decreased strength;Decreased range of motion;Decreased activity tolerance;Decreased balance;Decreased mobility;Decreased coordination;Pain       PT Treatment Interventions DME instruction;Gait training;Functional mobility training;Stair training;Therapeutic activities;Therapeutic exercise;Balance training;Neuromuscular re-education;Patient/family education;Modalities    PT Goals (Current goals can be found in the Care Plan section)  Acute Rehab PT Goals Patient Stated Goal: to be able to run the bases in softball, use stationary bike and return to water aerobics PT Goal Formulation: With patient Time For Goal Achievement: 03/14/23 Potential to Achieve Goals: Good    Frequency 7X/week     Co-evaluation               AM-PAC PT "6 Clicks" Mobility  Outcome Measure Help needed turning from your back to your side while in a flat bed without using bedrails?: A Little Help needed  moving from lying on your back to sitting on the side of a flat bed without using bedrails?: A Little Help needed moving to and from a bed to a chair (including a wheelchair)?: A Little Help needed standing up from a chair using your arms (e.g., wheelchair or bedside chair)?: A Little Help needed to walk in hospital room?: A Little Help needed climbing 3-5 steps with a railing? : Total 6 Click Score: 16    End of Session Equipment Utilized During Treatment: Gait belt Activity Tolerance: Treatment limited secondary to medical complications (Comment) (feeling hot possible precurser to LOC) Patient left: in chair;with call bell/phone within reach;with family/visitor present Nurse Communication: Mobility status;Other (comment) (pt physiological response) PT Visit Diagnosis: Unsteadiness on feet (R26.81);Other abnormalities of gait and mobility (R26.89);Muscle weakness (generalized) (M62.81);History of falling (Z91.81);Difficulty in walking, not elsewhere classified (R26.2);Pain Pain - Right/Left: Left Pain - part of body: Knee;Leg    Time: 4540-9811 PT Time Calculation (min) (ACUTE ONLY): 26 min   Charges:   PT Evaluation $PT Eval Low Complexity: 1 Low PT Treatments $Therapeutic Activity: 8-22 mins PT General Charges $$ ACUTE PT VISIT: 1 Visit  Johnny Bridge, PT Acute Rehab   Jacqualyn Posey 02/28/2023, 6:59 PM

## 2023-02-28 NOTE — Anesthesia Procedure Notes (Signed)
Anesthesia Regional Block: Adductor canal block   Pre-Anesthetic Checklist: , timeout performed,  Correct Patient, Correct Site, Correct Laterality,  Correct Procedure, Correct Position, site marked,  Risks and benefits discussed,  Surgical consent,  Pre-op evaluation,  At surgeon's request and post-op pain management  Laterality: Left  Prep: chloraprep       Needles:  Injection technique: Single-shot  Needle Type: Echogenic Stimulator Needle     Needle Length: 9cm  Needle Gauge: 21     Additional Needles:   Procedures:,,,, ultrasound used (permanent image in chart),,    Narrative:  Start time: 02/28/2023 10:30 AM End time: 02/28/2023 10:35 AM Injection made incrementally with aspirations every 5 mL.  Performed by: Personally  Anesthesiologist: Shelton Silvas, MD  Additional Notes: Discussed risks and benefits of the nerve block in detail, including but not limited vascular injury, permanent nerve damage and infection.   Patient tolerated the procedure well. Local anesthetic introduced in an incremental fashion under minimal resistance after negative aspirations. No paresthesias were elicited. After completion of the procedure, no acute issues were identified and patient continued to be monitored by RN.

## 2023-02-28 NOTE — Care Plan (Signed)
Ortho Bundle Case Management Note  Patient Details  Name: Susan Jenkins MRN: [id number] Date of Birth: 1946-03-08                  L TKA on 02/28/23. D CP: Home with sister.  DME: No needs. Has RW and cane.  PT: EO   DME Arranged:  N/A DME Agency:       Additional Comments: Please contact me with any questions of if this plan should need to change.    Despina Pole, CCM Case Manager, Raechel Chute  [phone] 02/28/2023, 1:32 PM

## 2023-02-28 NOTE — Transfer of Care (Signed)
Immediate Anesthesia Transfer of Care Note  Patient: Susan Jenkins  Procedure(s) Performed: TOTAL KNEE ARTHROPLASTY (Left: Knee)  Patient Location: PACU  Anesthesia Type:MAC combined with regional for post-op pain  Level of Consciousness: awake and alert   Airway & Oxygen Therapy: Patient Spontanous Breathing  Post-op Assessment: Report given to RN and Post -op Vital signs reviewed and stable  Post vital signs: Reviewed and stable  Last Vitals:  Vitals Value Taken Time  BP 132/60 02/28/23 1318  Temp    Pulse 62 02/28/23 1318  Resp 13 02/28/23 1318  SpO2 96 % 02/28/23 1318  Vitals shown include unfiled device data.  Last Pain:  Vitals:   02/28/23 1045  TempSrc:   PainSc: 0-No pain      Patients Stated Pain Goal: 4 (02/28/23 1000)  Complications: No notable events documented.

## 2023-02-28 NOTE — Discharge Instructions (Addendum)
Susan Gross, MD Total Joint Specialist [facility] [address] [place], [place] [address] [phone]  TOTAL KNEE REPLACEMENT POSTOPERATIVE DIRECTIONS    Knee Rehabilitation, Guidelines Following Surgery  Results after knee surgery are often greatly improved when you follow the exercise, range of motion and muscle strengthening exercises prescribed by your doctor. Safety measures are also important to protect the knee from further injury. If any of these exercises cause you to have increased pain or swelling in your knee joint, decrease the amount until you are comfortable again and slowly increase them. If you have problems or questions, call your caregiver or physical therapist for advice.   BLOOD CLOT PREVENTION Take a 10 mg Xarelto once a day for three weeks following surgery. Then resume an 81 mg Aspirin once a day. You may resume your vitamins/supplements once you have discontinued the Xarelto. Do not take any NSAIDs (Advil, Aleve, Ibuprofen, Meloxicam, etc.) until you have discontinued the Xarelto.    Information on my medicine - XARELTO (Rivaroxaban)  Why was Xarelto prescribed for you? Xarelto was prescribed for you to reduce the risk of blood clots forming after orthopedic surgery. The medical term for these abnormal blood clots is venous thromboembolism (VTE).  What do you need to know about xarelto ? Take your Xarelto ONCE DAILY at the same time every day. You may take it either with or without food.  If you have difficulty swallowing the tablet whole, you may crush it and mix in applesauce just prior to taking your dose.  Take Xarelto exactly as prescribed by your doctor and DO NOT stop taking Xarelto without talking to the doctor who prescribed the medication.  Stopping without other VTE prevention medication to take the place of Xarelto may increase your risk of developing a clot.  After discharge, you should have regular  check-up appointments with your healthcare provider that is prescribing your Xarelto.    What do you do if you miss a dose? If you miss a dose, take it as soon as you remember on the same day then continue your regularly scheduled once daily regimen the next day. Do not take two doses of Xarelto on the same day.   Important Safety Information A possible side effect of Xarelto is bleeding. You should call your healthcare provider right away if you experience any of the following: Bleeding from an injury or your nose that does not stop. Unusual colored urine (red or dark brown) or unusual colored stools (red or black). Unusual bruising for unknown reasons. A serious fall or if you hit your head (even if there is no bleeding).  Some medicines may interact with Xarelto and might increase your risk of bleeding while on Xarelto. To help avoid this, consult your healthcare provider or pharmacist prior to using any new prescription or non-prescription medications, including herbals, vitamins, non-steroidal anti-inflammatory drugs (NSAIDs) and supplements.  This website has more information on Xarelto: VisitDestination.com.br.    HOME CARE INSTRUCTIONS  Remove items at home which could result in a fall. This includes throw rugs or furniture in walking pathways.  ICE to the affected knee as much as tolerated. Icing helps control swelling. If the swelling is well controlled you will be more comfortable and rehab easier. Continue to use ice on the knee for pain and swelling from surgery. You may notice swelling that will progress down to the foot and ankle. This is normal after surgery. Elevate the leg when you are not up  walking on it.    Continue to use the breathing machine which will help keep your temperature down. It is common for your temperature to cycle up and down following surgery, especially at night when you are not up moving around and exerting yourself. The breathing machine keeps your lungs  expanded and your temperature down. Do not place pillow under the operative knee, focus on keeping the knee straight while resting  DIET You may resume your previous home diet once you are discharged from the hospital.  DRESSING / WOUND CARE / SHOWERING Keep your bulky bandage on for 2 days. On the third post-operative day you may remove the Ace bandage and gauze. There is a waterproof adhesive bandage on your skin which will stay in place until your first follow-up appointment. Once you remove this you will not need to place another bandage You may begin showering 3 days following surgery, but do not submerge the incision under water.  ACTIVITY For the first 5 days, the key is rest and control of pain and swelling Do your home exercises twice a day starting on post-operative day 3. On the days you go to physical therapy, just do the home exercises once that day. You should rest, ice and elevate the leg for 50 minutes out of every hour. Get up and walk/stretch for 10 minutes per hour. After 5 days you can increase your activity slowly as tolerated. Walk with your walker as instructed. Use the walker until you are comfortable transitioning to a cane. Walk with the cane in the opposite hand of the operative leg. You may discontinue the cane once you are comfortable and walking steadily. Avoid periods of inactivity such as sitting longer than an hour when not asleep. This helps prevent blood clots.  You may discontinue the knee immobilizer once you are able to perform a straight leg raise while lying down. You may resume a sexual relationship in one month or when given the OK by your doctor.  You may return to work once you are cleared by your doctor.  Do not drive a car for 6 weeks or until released by your surgeon.  Do not drive while taking narcotics.  TED HOSE STOCKINGS Wear the elastic stockings on both legs for three weeks following surgery during the day. You may remove them at night for  sleeping.  WEIGHT BEARING Weight bearing as tolerated with assist device (walker, cane, etc) as directed, use it as long as suggested by your surgeon or therapist, typically at least 4-6 weeks.  POSTOPERATIVE CONSTIPATION PROTOCOL Constipation - defined medically as fewer than three stools per week and severe constipation as less than one stool per week.  One of the most common issues patients have following surgery is constipation.  Even if you have a regular bowel pattern at home, your normal regimen is likely to be disrupted due to multiple reasons following surgery.  Combination of anesthesia, postoperative narcotics, change in appetite and fluid intake all can affect your bowels.  In order to avoid complications following surgery, here are some recommendations in order to help you during your recovery period.  Colace (docusate) - Pick up an over-the-counter form of Colace or another stool softener and take twice a day as long as you are requiring postoperative pain medications.  Take with a full glass of water daily.  If you experience loose stools or diarrhea, hold the colace until you stool forms back up. If your symptoms do not get better within 1  week or if they get worse, check with your doctor. Dulcolax (bisacodyl) - Pick up over-the-counter and take as directed by the product packaging as needed to assist with the movement of your bowels.  Take with a full glass of water.  Use this product as needed if not relieved by Colace only.  MiraLax (polyethylene glycol) - Pick up over-the-counter to have on hand. MiraLax is a solution that will increase the amount of water in your bowels to assist with bowel movements.  Take as directed and can mix with a glass of water, juice, soda, coffee, or tea. Take if you go more than two days without a movement. Do not use MiraLax more than once per day. Call your doctor if you are still constipated or irregular after using this medication for 7 days in a  row.  If you continue to have problems with postoperative constipation, please contact the office for further assistance and recommendations.  If you experience "the worst abdominal pain ever" or develop nausea or vomiting, please contact the office immediatly for further recommendations for treatment.  ITCHING If you experience itching with your medications, try taking only a single pain pill, or even half a pain pill at a time.  You can also use Benadryl over the counter for itching or also to help with sleep.   MEDICATIONS See your medication summary on the "After Visit Summary" that the nursing staff will review with you prior to discharge.  You may have some home medications which will be placed on hold until you complete the course of blood thinner medication.  It is important for you to complete the blood thinner medication as prescribed by your surgeon.  Continue your approved medications as instructed at time of discharge.  PRECAUTIONS If you experience chest pain or shortness of breath - call 911 immediately for transfer to the hospital emergency department.  If you develop a fever greater that 101 F, purulent drainage from wound, increased redness or drainage from wound, foul odor from the wound/dressing, or calf pain - CONTACT YOUR SURGEON.                                                   FOLLOW-UP APPOINTMENTS Make sure you keep all of your appointments after your operation with your surgeon and caregivers. You should call the office at the above phone number and make an appointment for approximately two weeks after the date of your surgery or on the date instructed by your surgeon outlined in the "After Visit Summary".  RANGE OF MOTION AND STRENGTHENING EXERCISES  Rehabilitation of the knee is important following a knee injury or an operation. After just a few days of immobilization, the muscles of the thigh which control the knee become weakened and shrink (atrophy). Knee exercises  are designed to build up the tone and strength of the thigh muscles and to improve knee motion. Often times heat used for twenty to thirty minutes before working out will loosen up your tissues and help with improving the range of motion but do not use heat for the first two weeks following surgery. These exercises can be done on a training (exercise) mat, on the floor, on a table or on a bed. Use what ever works the best and is most comfortable for you Knee exercises include:  Leg Lifts -  While your knee is still immobilized in a splint or cast, you can do straight leg raises. Lift the leg to 60 degrees, hold for 3 sec, and slowly lower the leg. Repeat 10-20 times 2-3 times daily. Perform this exercise against resistance later as your knee gets better.  Quad and Hamstring Sets - Tighten up the muscle on the front of the thigh (Quad) and hold for 5-10 sec. Repeat this 10-20 times hourly. Hamstring sets are done by pushing the foot backward against an object and holding for 5-10 sec. Repeat as with quad sets.  Leg Slides: Lying on your back, slowly slide your foot toward your buttocks, bending your knee up off the floor (only go as far as is comfortable). Then slowly slide your foot back down until your leg is flat on the floor again. Angel Wings: Lying on your back spread your legs to the side as far apart as you can without causing discomfort.  A rehabilitation program following serious knee injuries can speed recovery and prevent re-injury in the future due to weakened muscles. Contact your doctor or a physical therapist for more information on knee rehabilitation.   POST-OPERATIVE OPIOID TAPER INSTRUCTIONS: It is important to wean off of your opioid medication as soon as possible. If you do not need pain medication after your surgery it is ok to stop day one. Opioids include: Codeine, Hydrocodone(Norco, Vicodin), Oxycodone(Percocet, oxycontin) and hydromorphone amongst others.  Long term and even short  term use of opiods can cause: Increased pain response Dependence Constipation Depression Respiratory depression And more.  Withdrawal symptoms can include Flu like symptoms Nausea, vomiting And more Techniques to manage these symptoms Hydrate well Eat regular healthy meals Stay active Use relaxation techniques(deep breathing, meditating, yoga) Do Not substitute Alcohol to help with tapering If you have been on opioids for less than two weeks and do not have pain than it is ok to stop all together.  Plan to wean off of opioids This plan should start within one week post op of your joint replacement. Maintain the same interval or time between taking each dose and first decrease the dose.  Cut the total daily intake of opioids by one tablet each day Next start to increase the time between doses. The last dose that should be eliminated is the evening dose.   IF YOU ARE TRANSFERRED TO A SKILLED REHAB FACILITY If the patient is transferred to a skilled rehab facility following release from the hospital, a list of the current medications will be sent to the facility for the patient to continue.  When discharged from the skilled rehab facility, please have the facility set up the patient's Home Health Physical Therapy prior to being released. Also, the skilled facility will be responsible for providing the patient with their medications at time of release from the facility to include their pain medication, the muscle relaxants, and their blood thinner medication. If the patient is still at the rehab facility at time of the two week follow up appointment, the skilled rehab facility will also need to assist the patient in arranging follow up appointment in our office and any transportation needs.  MAKE SURE YOU:  Understand these instructions.  Get help right away if you are not doing well or get worse.   DENTAL ANTIBIOTICS:  In most cases prophylactic antibiotics for Dental procdeures after  total joint surgery are not necessary.  Exceptions are as follows:  1. History of prior total joint infection  2. Severely immunocompromised (  Organ Transplant, cancer chemotherapy, Rheumatoid biologic medications such as Humera)  3. Poorly controlled diabetes (A1C &gt; 8.0, blood glucose over 200)  If you have one of these conditions, contact your surgeon for an antibiotic prescription, prior to your dental procedure.    Pick up stool softner and laxative for home use following surgery while on pain medications. Do not submerge incision under water. Please use good hand washing techniques while changing dressing each day. May shower starting three days after surgery. Please use a clean towel to pat the incision dry following showers. Continue to use ice for pain and swelling after surgery. Do not use any lotions or creams on the incision until instructed by your surgeon.

## 2023-03-01 ENCOUNTER — Encounter (HOSPITAL_COMMUNITY): Payer: Self-pay | Admitting: Orthopedic Surgery

## 2023-03-01 DIAGNOSIS — M1712 Unilateral primary osteoarthritis, left knee: Secondary | ICD-10-CM | POA: Diagnosis not present

## 2023-03-01 LAB — CBC
HCT: 33.2 % — ABNORMAL LOW (ref 36.0–46.0)
Hemoglobin: 11.6 g/dL — ABNORMAL LOW (ref 12.0–15.0)
MCH: 35.5 pg — ABNORMAL HIGH (ref 26.0–34.0)
MCHC: 34.9 g/dL (ref 30.0–36.0)
MCV: 101.5 fL — ABNORMAL HIGH (ref 80.0–100.0)
Platelets: 296 10*3/uL (ref 150–400)
RBC: 3.27 MIL/uL — ABNORMAL LOW (ref 3.87–5.11)
RDW: 11.8 % (ref 11.5–15.5)
WBC: 15.2 10*3/uL — ABNORMAL HIGH (ref 4.0–10.5)
nRBC: 0 % (ref 0.0–0.2)

## 2023-03-01 LAB — BASIC METABOLIC PANEL
Anion gap: 11 (ref 5–15)
BUN: 17 mg/dL (ref 8–23)
CO2: 27 mmol/L (ref 22–32)
Calcium: 9.3 mg/dL (ref 8.9–10.3)
Chloride: 95 mmol/L — ABNORMAL LOW (ref 98–111)
Creatinine, Ser: 0.73 mg/dL (ref 0.44–1.00)
GFR, Estimated: >60 mL/min (ref >60)
Glucose, Bld: 136 mg/dL — ABNORMAL HIGH (ref 70–99)
Potassium: 3.7 mmol/L (ref 3.5–5.1)
Sodium: 133 mmol/L — ABNORMAL LOW (ref 135–145)

## 2023-03-01 MED ORDER — OXYCODONE HCL 5 MG PO TABS: 5.0000 mg | ORAL_TABLET | Freq: Four times a day (QID) | ORAL | 0 refills | Status: DC | PRN

## 2023-03-01 MED ORDER — RIVAROXABAN 10 MG PO TABS: 10.0000 mg | ORAL_TABLET | Freq: Every day | ORAL | 0 refills | Status: AC

## 2023-03-01 MED ORDER — ONDANSETRON HCL 4 MG PO TABS: 4.0000 mg | ORAL_TABLET | Freq: Four times a day (QID) | ORAL | 0 refills | Status: AC | PRN

## 2023-03-01 MED ORDER — METHOCARBAMOL 500 MG PO TABS: 500.0000 mg | ORAL_TABLET | Freq: Four times a day (QID) | ORAL | 0 refills | Status: DC | PRN

## 2023-03-01 NOTE — Progress Notes (Signed)
Physical Therapy Treatment Patient Details Name: Susan Jenkins MRN: [id number] DOB: Jul 10, 1945 Today's Date: 03/01/2023   History of Present Illness 77 yo female presents to therapy s/p L TKA on 02/28/2023 due to failure of conservative measures. Pt PMH includes but is not limited to: HLD, glaucoma, diplopia, HTN, mitral valve prolapse, back pain s/p surgery, ankle surgery, angina, depression, PE, and R TKA (2019).    PT Comments  Pt progressing well this session. Will see again this pm and pt will likely be ready for d/c later today     If plan is discharge home, recommend the following: A little help with walking and/or transfers;A little help with bathing/dressing/bathroom;Assistance with cooking/housework;Assist for transportation;Help with stairs or ramp for entrance   Can travel by private vehicle        Equipment Recommendations  None recommended by PT    Recommendations for Other Services       Precautions / Restrictions Precautions Precautions: Knee;Fall Restrictions Weight Bearing Restrictions: No     Mobility  Bed Mobility Overal bed mobility: Needs Assistance Bed Mobility: Supine to Sit     Supine to sit: Min assist     General bed mobility comments: min A for L LE initally and pt able to complete supine to sit with CGA    Transfers Overall transfer level: Needs assistance Equipment used: Rolling walker (2 wheels) Transfers: Sit to/from Stand Sit to Stand: Contact guard assist           General transfer comment: cues for hand placement    Ambulation/Gait Ambulation/Gait assistance: Contact guard assist Gait Distance (Feet): 80 Feet Assistive device: Rolling walker (2 wheels) Gait Pattern/deviations: Step-to pattern, Antalgic, Trunk flexed Gait velocity: decreased     General Gait Details: cues for sequence and RW position   Stairs             Wheelchair Mobility     Tilt Bed    Modified Rankin (Stroke Patients Only)        Balance Overall balance assessment: Needs assistance, History of Falls (1 fall in past 6 months) Sitting-balance support: Feet supported Sitting balance-Leahy Scale: Good     Standing balance support: During functional activity, Reliant on assistive device for balance, Bilateral upper extremity supported Standing balance-Leahy Scale: Poor                              Cognition Arousal: Alert Behavior During Therapy: WFL for tasks assessed/performed Overall Cognitive Status: Within Functional Limits for tasks assessed                                          Exercises Total Joint Exercises Ankle Circles/Pumps: AROM, Both, 15 reps Quad Sets: AROM, Both, 5 reps Heel Slides: AAROM, Left, 10 reps Straight Leg Raises: AROM, Left, 5 reps    General Comments        Pertinent Vitals/Pain Pain Assessment Pain Assessment: 0-10 Pain Score: 4  Pain Location: L knee and leg Pain Descriptors / Indicators: Aching, Discomfort, Dull, Grimacing, Operative site guarding Pain Intervention(s): Limited activity within patient's tolerance, Monitored during session, Premedicated before session, Repositioned, Ice applied    Home Living                          Prior Function  PT Goals (current goals can now be found in the care plan section) Acute Rehab PT Goals Patient Stated Goal: to be able to run the bases in softball, use stationary bike and return to water aerobics PT Goal Formulation: With patient Time For Goal Achievement: 03/14/23 Potential to Achieve Goals: Good Progress towards PT goals: Progressing toward goals    Frequency    7X/week      PT Plan      Co-evaluation              AM-PAC PT "6 Clicks" Mobility   Outcome Measure  Help needed turning from your back to your side while in a flat bed without using bedrails?: A Little Help needed moving from lying on your back to sitting on the side of a flat  bed without using bedrails?: A Little Help needed moving to and from a bed to a chair (including a wheelchair)?: A Little Help needed standing up from a chair using your arms (e.g., wheelchair or bedside chair)?: A Little Help needed to walk in hospital room?: A Little Help needed climbing 3-5 steps with a railing? : A Lot 6 Click Score: 17    End of Session Equipment Utilized During Treatment: Gait belt Activity Tolerance: Patient tolerated treatment well Patient left: in chair;with call bell/phone within reach;with family/visitor present;with chair alarm set Nurse Communication: Mobility status PT Visit Diagnosis: Unsteadiness on feet (R26.81);Other abnormalities of gait and mobility (R26.89);Muscle weakness (generalized) (M62.81);History of falling (Z91.81);Difficulty in walking, not elsewhere classified (R26.2);Pain Pain - Right/Left: Left Pain - part of body: Knee;Leg     Time: 1056-1130 PT Time Calculation (min) (ACUTE ONLY): 34 min  Charges:    $Gait Training: 8-22 mins $Therapeutic Exercise: 8-22 mins PT General Charges $$ ACUTE PT VISIT: 1 Visit                     Saesha Llerenas, PT  Acute Rehab Dept [facility]) [phone]  03/01/2023    [facility] 03/01/2023, 11:41 AM

## 2023-03-01 NOTE — Progress Notes (Signed)
Nursing Discharge Note   Name: Susan Jenkins MRN: [id number] DOB: 08-Jan-1946    Admit Date: 02/28/2023  Discharge Date: 03/01/2023   Susan Jenkins is to be discharged home per MD order.  AVS completed. Reviewed with patient and family at bedside. Highlighted copy provided for patient to take home.  Patient able to verbalize understanding of discharge instructions. PIV removed. Patient stable upon discharge.  Patient aware of first Outpatient PT appointment on 10/31 and aware of follow-up with Dr. Lequita Halt scheduled.  Susan Jenkins is also aware of outpatient prescriptions that have been sent in to her home pharmacy. She is aware that she will need to stop and pick them up on her way home.   Discharge Instructions     Call MD / Call 911   Complete by: As directed    If you experience chest pain or shortness of breath, CALL 911 and be transported to the hospital emergency room.  If you develope a fever above 101 F, pus (white drainage) or increased drainage or redness at the wound, or calf pain, call your surgeon's office.   Change dressing   Complete by: As directed    You may remove the bulky bandage (ACE wrap and gauze) two days after surgery. You will have an adhesive waterproof bandage underneath. Leave this in place until your first follow-up appointment.   Constipation Prevention   Complete by: As directed    Drink plenty of fluids.  Prune juice may be helpful.  You may use a stool softener, such as Colace (over the counter) 100 mg twice a day.  Use MiraLax (over the counter) for constipation as needed.   Diet - low sodium heart healthy   Complete by: As directed    Do not put a pillow under the knee. Place it under the heel.   Complete by: As directed    Driving restrictions   Complete by: As directed    No driving for two weeks   Post-operative opioid taper instructions:   Complete by: As directed    POST-OPERATIVE OPIOID TAPER INSTRUCTIONS: It is important  to wean off of your opioid medication as soon as possible. If you do not need pain medication after your surgery it is ok to stop day one. Opioids include: Codeine, Hydrocodone(Norco, Vicodin), Oxycodone(Percocet, oxycontin) and hydromorphone amongst others.  Long term and even short term use of opiods can cause: Increased pain response Dependence Constipation Depression Respiratory depression And more.  Withdrawal symptoms can include Flu like symptoms Nausea, vomiting And more Techniques to manage these symptoms Hydrate well Eat regular healthy meals Stay active Use relaxation techniques(deep breathing, meditating, yoga) Do Not substitute Alcohol to help with tapering If you have been on opioids for less than two weeks and do not have pain than it is ok to stop all together.  Plan to wean off of opioids This plan should start within one week post op of your joint replacement. Maintain the same interval or time between taking each dose and first decrease the dose.  Cut the total daily intake of opioids by one tablet each day Next start to increase the time between doses. The last dose that should be eliminated is the evening dose.      TED hose   Complete by: As directed    Use stockings (TED hose) for three weeks on both leg(s).  You may remove them at night for sleeping.   Weight bearing as tolerated   Complete by: As  directed          Allergies as of 03/01/2023       Reactions   Amoxicillin-pot Clavulanate Anaphylaxis   Has patient had a PCN reaction causing immediate rash, facial/tongue/throat swelling, SOB or lightheadedness with hypotension: Yes Has patient had a PCN reaction causing severe rash involving mucus membranes or skin necrosis: Yes Has patient had a PCN reaction that required hospitalization Yes Has patient had a PCN reaction occurring within the last 10 years: No If all of the above answers are "NO", then may proceed with Cephalosporin use.    Augmentin [amoxicillin-pot Clavulanate] Anaphylaxis   Nickel Rash        Medication List     STOP taking these medications    aspirin 81 MG tablet   ibuprofen 200 MG tablet Commonly known as: ADVIL       TAKE these medications    acetaminophen 650 MG CR tablet Commonly known as: TYLENOL Take 650 mg by mouth in the morning and at bedtime.   AMBULATORY NON FORMULARY MEDICATION 2% Diltiazem ointment Apply to the anal sphincter 5 times daily   amLODipine 10 MG tablet Commonly known as: NORVASC Take 1 tablet (10 mg total) by mouth daily.   CALCIUM 600-VITAMIN D3 PO Take 1 tablet by mouth daily.   diazepam 5 MG tablet Commonly known as: Valium Take one tablet by mouth  one hour prior to your MRI   diltiazem 2 % Gel Apply 1 application. topically 2 (two) times daily.   FISH OIL PO Take 1 capsule by mouth daily.   GLUCOSAMINE CHONDROITIN ADV PO Take 1 tablet by mouth 2 (two) times daily.   hydrochlorothiazide 25 MG tablet Commonly known as: HYDRODIURIL TAKE 1 TABLET (25 MG TOTAL) BY MOUTH DAILY.   latanoprost 0.005 % ophthalmic solution Commonly known as: XALATAN Place 1 drop into both eyes at bedtime.   loratadine 10 MG tablet Commonly known as: CLARITIN Take 10 mg by mouth daily.   meclizine 25 MG tablet Commonly known as: ANTIVERT Take 1 tablet (25 mg total) by mouth 3 (three) times daily as needed for dizziness.   MELATONIN PO Take 3 mg by mouth at bedtime.   Metamucil Fiber Chew Chew 2-3 each by mouth daily.   methocarbamol 500 MG tablet Commonly known as: ROBAXIN Take 1 tablet (500 mg total) by mouth every 6 (six) hours as needed for muscle spasms.   MULTIVITAMIN ADULT PO Take 1 tablet by mouth daily.   ondansetron 4 MG tablet Commonly known as: ZOFRAN Take 1 tablet (4 mg total) by mouth every 6 (six) hours as needed for nausea.   oxyCODONE 5 MG immediate release tablet Commonly known as: Oxy IR/ROXICODONE Take 1-2 tablets (5-10 mg  total) by mouth every 6 (six) hours as needed for severe pain (pain score 7-10).   PROBIOTIC PO Take 1 capsule by mouth at bedtime.   pyridOXINE 50 MG tablet Commonly known as: VITAMIN B6 Take 50 mg by mouth daily.   rivaroxaban 10 MG Tabs tablet Commonly known as: XARELTO Take 1 tablet (10 mg total) by mouth daily with breakfast for 20 days. Then resume one 81 mg aspirin once a day Start taking on: March 02, 2023   rosuvastatin 10 MG tablet Commonly known as: CRESTOR Take 1 tablet (10 mg total) by mouth daily.   timolol 0.5 % ophthalmic solution Commonly known as: TIMOPTIC Place 1 drop into both eyes 2 (two) times daily.   venlafaxine XR 75 MG 24  hr capsule Commonly known as: EFFEXOR-XR TAKE 1 CAPSULE BY MOUTH EVERY DAY               Discharge Care Instructions  (From admission, onward)           Start     Ordered   03/01/23 0000  Weight bearing as tolerated        03/01/23 0839   03/01/23 0000  Change dressing       Comments: You may remove the bulky bandage (ACE wrap and gauze) two days after surgery. You will have an adhesive waterproof bandage underneath. Leave this in place until your first follow-up appointment.   03/01/23 0839             Discharge Instructions/ Education: Discharge instructions given to patient with verbalized understanding. Discharge education completed with patient including: follow up instructions, medication list, discharge activities, and limitations if indicated.Patient and family able to verbalize understanding, all questions fully answered. Patient instructed to return to Emergency Department, call 911, or call MD for any changes in condition.  Patient escorted via wheelchair to lobby and discharged home via private automobile.

## 2023-03-01 NOTE — Progress Notes (Signed)
Physical Therapy Treatment Patient Details Name: Susan Jenkins MRN: [id number] DOB: [birth year] Today's Date: 03/01/2023   History of Present Illness 77 yo female presents to therapy s/p L TKA on 02/28/2023 due to failure of conservative measures. Pt PMH includes but is not limited to: HLD, glaucoma, diplopia, HTN, mitral valve prolapse, back pain s/p surgery, ankle surgery, angina, depression, PE, and R TKA (2019).    PT Comments  Pt Is meeting goals, making excellent progress and is ready to d/c with family assist as neede. Anticipate steady progress in OP setting. Reviewed TKA ex handout with pt and caregiver.   If plan is discharge home, recommend the following: A little help with walking and/or transfers;A little help with bathing/dressing/bathroom;Assistance with cooking/housework;Assist for transportation;Help with stairs or ramp for entrance   Can travel by private vehicle        Equipment Recommendations  None recommended by PT    Recommendations for Other Services       Precautions / Restrictions Precautions Precautions: Knee;Fall Restrictions Weight Bearing Restrictions: No     Mobility  Bed Mobility Overal bed mobility: Needs Assistance  Transfers Overall transfer level: Needs assistance Equipment used: Rolling walker (2 wheels) Transfers: Sit to/from Stand Sit to Stand: Contact guard assist, Supervision           General transfer comment: cues for hand placement and LLE position    Ambulation/Gait Ambulation/Gait assistance: Contact guard assist, Supervision Gait Distance (Feet): 90 Feet Assistive device: Rolling walker (2 wheels) Gait Pattern/deviations: Step-to pattern Gait velocity: decreased     General Gait Details: cues for sequence and RW position; reviewed safety with RW amb ~ 6' backwards   Stairs Stairs: Yes Stairs assistance: Contact guard assist Stair Management: Step to pattern, With walker, Forwards Number of Stairs:  1 General stair comments: cues for sequence,caregiver present; no LOB or knee buckling with forward technique   Wheelchair Mobility     Tilt Bed    Modified Rankin (Stroke Patients Only)       Balance Overall balance assessment: Needs assistance, History of Falls (1 fall in past 6 months) Sitting-balance support: Feet supported Sitting balance-Leahy Scale: Good     Standing balance support: Reliant on assistive device for balance, During functional activity Standing balance-Leahy Scale: Fair                              Cognition Arousal: Alert Behavior During Therapy: WFL for tasks assessed/performed Overall Cognitive Status: Within Functional Limits for tasks assessed                                          Exercises   General Comments        Pertinent Vitals/Pain Pain Assessment Pain Assessment: 0-10 Pain Score: 4  Pain Location: L knee and leg Pain Descriptors / Indicators: Aching, Discomfort, Dull, Grimacing, Operative site guarding Pain Intervention(s): Limited activity within patient's tolerance, Monitored during session, Premedicated before session, Repositioned    Home Living                          Prior Function            PT Goals (current goals can now be found in the care plan section) Acute Rehab PT Goals Patient Stated Goal: to be able  to run the bases in softball, use stationary bike and return to water aerobics PT Goal Formulation: With patient Time For Goal Achievement: 03/14/23 Potential to Achieve Goals: Good Progress towards PT goals: Progressing toward goals    Frequency    7X/week      PT Plan      Co-evaluation              AM-PAC PT "6 Clicks" Mobility   Outcome Measure  Help needed turning from your back to your side while in a flat bed without using bedrails?: A Little Help needed moving from lying on your back to sitting on the side of a flat bed without using  bedrails?: A Little Help needed moving to and from a bed to a chair (including a wheelchair)?: A Little Help needed standing up from a chair using your arms (e.g., wheelchair or bedside chair)?: A Little Help needed to walk in hospital room?: A Little Help needed climbing 3-5 steps with a railing? : A Little 6 Click Score: 18    End of Session Equipment Utilized During Treatment: Gait belt Activity Tolerance: Patient tolerated treatment well Patient left: in chair;with call bell/phone within reach;with family/visitor present;with chair alarm set Nurse Communication: Mobility status PT Visit Diagnosis: Unsteadiness on feet (R26.81);Other abnormalities of gait and mobility (R26.89);Muscle weakness (generalized) (M62.81);History of falling (Z91.81);Difficulty in walking, not elsewhere classified (R26.2);Pain Pain - Right/Left: Left Pain - part of body: Knee;Leg     Time: 6045-4098 PT Time Calculation (min) (ACUTE ONLY): 27 min  Charges:    $Gait Training: 23-37 mins $Therapeutic Exercise: 8-22 mins PT General Charges $$ ACUTE PT VISIT: 1 Visit                     Tascha Casares, PT  Acute Rehab Dept [facility]) [phone]  03/01/2023    [facility] 03/01/2023, 2:38 PM

## 2023-03-01 NOTE — Progress Notes (Signed)
   Subjective: 1 Day Post-Op Procedure(s) (LRB): TOTAL KNEE ARTHROPLASTY (Left) Patient seen in rounds by Dr. Lequita Halt. Patient is well, and has had no acute complaints or problems. Denies SOB or chest pain. Denies calf pain. Foley cath removed this AM. Patient reports pain as moderate. Worked with physical therapy yesterday and ambulated 25'. We will continue physical therapy today.  Objective: Vital signs in last 24 hours: Temp:  [97.7 F (36.5 C)-98.4 F (36.9 C)] 97.8 F (36.6 C) (10/29 0109) Pulse Rate:  [55-87] 87 (10/29 0109) Resp:  [13-18] 16 (10/29 0109) BP: (113-161)/(48-73) 147/59 (10/29 0109) SpO2:  [92 %-97 %] 94 % (10/29 0109) Weight:  [951 kg] 108 kg (10/28 1537)  Intake/Output from previous day:  Intake/Output Summary (Last 24 hours) at 03/01/2023 0837 Last data filed at 03/01/2023 0818 Gross per 24 hour  Intake 2991.6 ml  Output 1950 ml  Net 1041.6 ml     Intake/Output this shift: Total I/O In: 329.2 [P.O.:240; I.V.:89.2] Out: -   Labs: Recent Labs    03/01/23 0346  HGB 11.6*   Recent Labs    03/01/23 0346  WBC 15.2*  RBC 3.27*  HCT 33.2*  PLT 296   Recent Labs    03/01/23 0346  NA 133*  K 3.7  CL 95*  CO2 27  BUN 17  CREATININE 0.73  GLUCOSE 136*  CALCIUM 9.3   No results for input(s): "LABPT", "INR" in the last 72 hours.  Exam: General - Patient is Alert and Oriented Extremity - Neurologically intact Neurovascular intact Sensation intact distally Dorsiflexion/Plantar flexion intact Dressing - dressing C/D/I Motor Function - intact, moving foot and toes well on exam.  Past Medical History:  Diagnosis Date   Complication of anesthesia    Depression    Glaucoma    Hemorrhoids    History of diverticulitis 09/2015   HOCM (hypertrophic obstructive cardiomyopathy) (HCC)    Hyperlipidemia    Hypertension    PONV (postoperative nausea and vomiting)    Pulmonary embolus (HCC) 1971   following surgery   Vitamin D deficiency      Assessment/Plan: 1 Day Post-Op Procedure(s) (LRB): TOTAL KNEE ARTHROPLASTY (Left) Principal Problem:   OA (osteoarthritis) of knee Active Problems:   Osteoarthritis of left knee  Estimated body mass index is 38.41 kg/m as calculated from the following:   Height as of this encounter: 5\' 6"  (1.676 m).   Weight as of this encounter: 108 kg. Advance diet Up with therapy D/C IV fluids  Patient's anticipated LOS is less than 2 midnights, meeting these requirements: - Lives within 1 hour of care - Has a competent adult at home to recover with post-op - NO history of  - Chronic pain requiring opiods  - Diabetes  - Coronary Artery Disease  - Heart failure  - Heart attack  - Stroke  - DVT/VTE  - Cardiac arrhythmia  - Respiratory Failure/COPD  - Renal failure  - Anemia  - Advanced Liver disease  DVT Prophylaxis - Xarelto Weight bearing as tolerated.  Continue physical therapy. Expected discharge home today pending progress and if meeting patient goals. Scheduled for OPPT at [facility]. Follow-up in clinic in 2 weeks.  The PDMP database was reviewed today prior to any opioid medications being prescribed to this patient.  R. Arcola Jansky, PA-C Orthopedic Surgery 03/01/2023, 8:37 AM

## 2023-03-01 NOTE — Care Management Obs Status (Signed)
MEDICARE OBSERVATION STATUS NOTIFICATION   Patient Details  Name: Milanie Fest MRN: [id number] Date of Birth: 05-11-1945   Medicare Observation Status Notification Given:  Yes    Ewing Schlein, LCSW 03/01/2023, 1:43 PM

## 2023-03-01 NOTE — TOC Transition Note (Signed)
Transition of Care [facility]) - CM/SW Discharge Note  Patient Details  Name: Susan Jenkins MRN: [id number] Date of Birth: 1946-03-11  [facility] CM/SW Contact:  Ewing Schlein, LCSW Phone Number: 03/01/2023, 10:24 AM  Clinical Narrative: Patient is expected to discharge home after working with PT. CSW met with patient to confirm discharge plan. Patient will go home with OPPT at [facility]. Patient has a rolling walker, cane, and BSC at home so there DME needs at this time. TOC signing off.  Final next level of care: OP Rehab Barriers to Discharge: No Barriers Identified  Patient Goals and CMS Choice Choice offered to / list presented to : NA  Discharge Plan and Services Additional resources added to the After Visit Summary for          DME Arranged: N/A DME Agency: NA  Social Determinants of Health (SDOH) Interventions SDOH Screenings   Food Insecurity: No Food Insecurity (02/28/2023)  Housing: Low Risk  (02/28/2023)  Transportation Needs: No Transportation Needs (02/28/2023)  Utilities: Not At Risk (02/28/2023)  Alcohol Screen: Low Risk  (09/20/2022)  Depression (PHQ2-9): Low Risk  (01/13/2023)  Financial Resource Strain: Low Risk  (09/20/2022)  Physical Activity: Sufficiently Active (09/20/2022)  Recent Concern: Physical Activity - Insufficiently Active (06/23/2022)  Social Connections: Moderately Integrated (09/20/2022)  Stress: No Stress Concern Present (09/20/2022)  Recent Concern: Stress - Stress Concern Present (06/23/2022)  Tobacco Use: Medium Risk (02/28/2023)   Readmission Risk Interventions     No data to display

## 2023-03-03 DIAGNOSIS — M25662 Stiffness of left knee, not elsewhere classified: Secondary | ICD-10-CM | POA: Diagnosis not present

## 2023-03-03 DIAGNOSIS — M25562 Pain in left knee: Secondary | ICD-10-CM | POA: Diagnosis not present

## 2023-03-04 NOTE — Discharge Summary (Signed)
Physician Discharge Summary   Patient ID: Susan Jenkins MRN: [id number] DOB/AGE: 1946/03/19 77 y.o.  Admit date: 02/28/2023 Discharge date: 03/01/2023  Primary Diagnosis: Osteoarthritis, right knee   Admission Diagnoses:  Past Medical History:  Diagnosis Date   Complication of anesthesia    Depression    Glaucoma    Hemorrhoids    History of diverticulitis 09/2015   HOCM (hypertrophic obstructive cardiomyopathy) (HCC)    Hyperlipidemia    Hypertension    PONV (postoperative nausea and vomiting)    Pulmonary embolus (HCC) 1971   following surgery   Vitamin D deficiency    Discharge Diagnoses:   Principal Problem:   OA (osteoarthritis) of knee Active Problems:   Osteoarthritis of left knee  Estimated body mass index is 38.41 kg/m as calculated from the following:   Height as of this encounter: 5\' 6"  (1.676 m).   Weight as of this encounter: 108 kg.  Procedure:  Procedure(s) (LRB): TOTAL KNEE ARTHROPLASTY (Left)   Consults: None  HPI: Susan Jenkins is a 77 y.o. year old female with end stage OA of her left knee with progressively worsening pain and dysfunction. She has constant pain, with activity and at rest and significant functional deficits with difficulties even with ADLs. She has had extensive non-op management including analgesics, injections of cortisone and viscosupplements, and home exercise program, but remains in significant pain with significant dysfunction. Radiographs show bone on bone arthritis medial and patellofemoral. She presents now for left Total Knee Arthroplasty.     Laboratory Data: Admission on 02/28/2023, Discharged on 03/01/2023  Component Date Value Ref Range Status   WBC 03/01/2023 15.2 (H)  4.0 - 10.5 K/uL Final   RBC 03/01/2023 3.27 (L)  3.87 - 5.11 MIL/uL Final   Hemoglobin 03/01/2023 11.6 (L)  12.0 - 15.0 g/dL Final   HCT 40/98/1191 33.2 (L)  36.0 - 46.0 % Final   MCV 03/01/2023 101.5 (H)  80.0 - 100.0 fL Final    MCH 03/01/2023 35.5 (H)  26.0 - 34.0 pg Final   MCHC 03/01/2023 34.9  30.0 - 36.0 g/dL Final   RDW 47/82/9562 11.8  11.5 - 15.5 % Final   Platelets 03/01/2023 296  150 - 400 K/uL Final   nRBC 03/01/2023 0.0  0.0 - 0.2 % Final   Performed at [facility], [address] [place], [place] [address]   Sodium 03/01/2023 133 (L)  135 - 145 mmol/L Final   Potassium 03/01/2023 3.7  3.5 - 5.1 mmol/L Final   Chloride 03/01/2023 95 (L)  98 - 111 mmol/L Final   CO2 03/01/2023 27  22 - 32 mmol/L Final   Glucose, Bld 03/01/2023 136 (H)  70 - 99 mg/dL Final   Glucose reference range applies only to samples taken after fasting for at least 8 hours.   BUN 03/01/2023 17  8 - 23 mg/dL Final   Creatinine, Ser 03/01/2023 0.73  0.44 - 1.00 mg/dL Final   Calcium 57/84/6962 9.3  8.9 - 10.3 mg/dL Final   GFR, Estimated 03/01/2023 >60  >60 mL/min Final   Comment: (NOTE) Calculated using the CKD-EPI Creatinine Equation (2021)    Anion gap 03/01/2023 11  5 - 15 Final   Performed at [facility], [address], [place], [place] [address]  Appointment on 02/17/2023  Component Date Value Ref Range Status   Area-P 1/2 02/17/2023 3.99  cm2 Final   S' Lateral 02/17/2023 3.00  cm Final   AV Area  mean vel 02/17/2023 1.97  cm2 Final   AR max vel 02/17/2023 1.97  cm2 Final   AV Area VTI 02/17/2023 2.05  cm2 Final   P 1/2 time 02/17/2023 562  msec Final   Ao pk vel 02/17/2023 1.93  m/s Final   AV Mean grad 02/17/2023 8.0  mmHg Final   AV Peak grad 02/17/2023 14.9  mmHg Final   Est EF 02/17/2023 60 - 65%   Final  Hospital Outpatient Visit on 02/15/2023  Component Date Value Ref Range Status   MRSA, PCR 02/15/2023 NEGATIVE  NEGATIVE Final   Staphylococcus aureus 02/15/2023 NEGATIVE  NEGATIVE Final   Comment: (NOTE) The Xpert SA Assay (FDA approved for NASAL specimens in patients 26 years of age and older), is one component of a comprehensive surveillance program. It is not  intended to diagnose infection nor to guide or monitor treatment. Performed at [facility], [address], [place], [place] [address]    Sodium 02/15/2023 135  135 - 145 mmol/L Final   Potassium 02/15/2023 3.9  3.5 - 5.1 mmol/L Final   Chloride 02/15/2023 97 (L)  98 - 111 mmol/L Final   CO2 02/15/2023 28  22 - 32 mmol/L Final   Glucose, Bld 02/15/2023 113 (H)  70 - 99 mg/dL Final   Glucose reference range applies only to samples taken after fasting for at least 8 hours.   BUN 02/15/2023 13  8 - 23 mg/dL Final   Creatinine, Ser 02/15/2023 0.82  0.44 - 1.00 mg/dL Final   Calcium 82/95/6213 9.7  8.9 - 10.3 mg/dL Final   GFR, Estimated 02/15/2023 >60  >60 mL/min Final   Comment: (NOTE) Calculated using the CKD-EPI Creatinine Equation (2021)    Anion gap 02/15/2023 10  5 - 15 Final   Performed at [facility], [address], [place], [place] [address]   WBC 02/15/2023 9.2  4.0 - 10.5 K/uL Final   RBC 02/15/2023 3.79 (L)  3.87 - 5.11 MIL/uL Final   Hemoglobin 02/15/2023 13.2  12.0 - 15.0 g/dL Final   HCT 84/69/6295 38.8  36.0 - 46.0 % Final   MCV 02/15/2023 102.4 (H)  80.0 - 100.0 fL Final   MCH 02/15/2023 34.8 (H)  26.0 - 34.0 pg Final   MCHC 02/15/2023 34.0  30.0 - 36.0 g/dL Final   RDW 28/41/3244 12.1  11.5 - 15.5 % Final   Platelets 02/15/2023 318  150 - 400 K/uL Final   nRBC 02/15/2023 0.0  0.0 - 0.2 % Final   Performed at [facility], [address], [place], [place] [address]  Office Visit on 01/13/2023  Component Date Value Ref Range Status   Hemoglobin A1C 01/13/2023 5.9 (A)  4.0 - 5.6 % Final     X-Rays:ECHOCARDIOGRAM COMPLETE  Result Date: 02/17/2023    ECHOCARDIOGRAM REPORT   Patient Name:   Susan Jenkins Date of Exam: 02/17/2023 Medical Rec #:  [id number]              Height:       66.0 in Accession #:    [id number]             Weight:       237.0 lb Date of Birth:  May 31, 1945              BSA:           2.149 m Patient Age:    22 years  BP:           124/70 mmHg Patient Gender: F                      HR:           59 bpm. Exam Location:  [address] Procedure: 2D Echo, Cardiac Doppler, Color Doppler and Intracardiac            Opacification Agent Indications:    I42.1 HOCM  History:        Patient has no prior history of Echocardiogram examinations.                 Hypertrophic Cardiomyopathy; Risk Factors:Family History of                 Coronary Artery Disease, Hypertension, Dyslipidemia and Former                 Smoker. Suspected HOCM with Apical Ballooning, History of                 Pulmonary Embolism, Pre-Operative Eval for Knee Replacement.  Sonographer:    Farrel Conners RDCS Referring Phys: Deeann Saint IMPRESSIONS  1. Left ventricular ejection fraction, by estimation, is 60 to 65%. The left ventricle has normal function. The left ventricle demonstrates regional wall motion abnormalities with a small dyskinetic pouch at the true apex. There is asymmetric moderate-severe apical hypertrophy with the small apical dyskinetic pouch seen best in the 4-chamber view. There is a mid-cavity LV gradient, peak around 30 mmHg. Left ventricular diastolic parameters are consistent with Grade I diastolic dysfunction (impaired relaxation). 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 Consider cardiac MRI for delayed enhancement evaluation/risk stratification.  2. Right ventricular systolic function is mildly reduced. The right ventricular size is normal. Tricuspid regurgitation signal is inadequate for assessing PA pressure.  3. Left atrial size was moderately dilated.  4. The mitral valve is normal in structure. Trivial mitral valve regurgitation. No evidence of mitral stenosis.  5. The aortic valve is tricuspid. There is mild calcification of the aortic valve. Aortic valve regurgitation is mild. Aortic valve sclerosis/calcification is present, without any  evidence of aortic stenosis.  6. The inferior vena cava is normal in size with greater than 50% respiratory variability, suggesting right atrial pressure of 3 mmHg.  7. Cystic structure in liver measuring 5.5 x 4.3 cm, consider dedicated ultrasound. FINDINGS  Left Ventricle: Left ventricular ejection fraction, by estimation, is 60 to 65%. The left ventricle has normal function. The left ventricle demonstrates regional wall motion abnormalities. Definity contrast agent was given IV to delineate the left ventricular endocardial borders. The left ventricular internal cavity size was normal in size. There is asymmetric moderate-severe apical hypertrophy with a small apical dyskinetic pouch seen best in the 4-chamber view left ventricular hypertrophy. Left ventricular diastolic parameters are consistent with Grade I diastolic dysfunction (impaired relaxation). Right Ventricle: The right ventricular size is normal. No increase in right ventricular wall thickness. Right ventricular systolic function is mildly reduced. Tricuspid regurgitation signal is inadequate for assessing PA pressure. Left Atrium: Left atrial size was moderately dilated. Right Atrium: Right atrial size was normal in size. Pericardium: Trivial pericardial effusion is present. Mitral Valve: The mitral valve is normal in structure. Trivial mitral valve regurgitation. No evidence of mitral valve stenosis. Tricuspid Valve: The tricuspid valve is normal in structure. Tricuspid valve regurgitation is trivial. Aortic Valve: The aortic valve is tricuspid. There is  mild calcification of the aortic valve. Aortic valve regurgitation is mild. Aortic regurgitation PHT measures 562 msec. Aortic valve sclerosis/calcification is present, without any evidence of aortic stenosis. Aortic valve mean gradient measures 8.0 mmHg. Aortic valve peak gradient measures 14.9 mmHg. Aortic valve area, by VTI measures 2.05 cm. Pulmonic Valve: The pulmonic valve was normal in  structure. Pulmonic valve regurgitation is trivial. Aorta: The aortic root is normal in size and structure. Venous: The inferior vena cava is normal in size with greater than 50% respiratory variability, suggesting right atrial pressure of 3 mmHg. IAS/Shunts: No atrial level shunt detected by color flow Doppler.  LEFT VENTRICLE PLAX 2D LVIDd:         5.60 cm   Diastology LVIDs:         3.00 cm   LV e' medial:    5.72 cm/s LV PW:         1.30 cm   LV E/e' medial:  14.6 LV IVS:        1.10 cm   LV e' lateral:   8.58 cm/s LVOT diam:     2.10 cm   LV E/e' lateral: 9.8 LV SV:         94 LV SV Index:   44 LVOT Area:     3.46 cm  RIGHT VENTRICLE RV Basal diam:  3.30 cm RV S prime:     17.70 cm/s TAPSE (M-mode): 2.4 cm LEFT ATRIUM              Index        RIGHT ATRIUM           Index LA diam:        5.50 cm  2.56 cm/m   RA Pressure: 3.00 mmHg LA Vol (A2C):   89.2 ml  41.50 ml/m  RA Area:     13.40 cm LA Vol (A4C):   111.0 ml 51.64 ml/m  RA Volume:   32.40 ml  15.07 ml/m LA Biplane Vol: 101.0 ml 46.99 ml/m  AORTIC VALVE AV Area (Vmax):    1.97 cm AV Area (Vmean):   1.97 cm AV Area (VTI):     2.05 cm AV Vmax:           193.00 cm/s AV Vmean:          126.000 cm/s AV VTI:            0.456 m AV Peak Grad:      14.9 mmHg AV Mean Grad:      8.0 mmHg LVOT Vmax:         110.00 cm/s LVOT Vmean:        71.800 cm/s LVOT VTI:          0.270 m LVOT/AV VTI ratio: 0.59 AI PHT:            562 msec  AORTA Ao Root diam: 3.50 cm Ao Asc diam:  3.50 cm MITRAL VALVE               TRICUSPID VALVE MV Area (PHT): cm         Estimated RAP:  3.00 mmHg MV Decel Time: 190 msec MV E velocity: 83.70 cm/s  SHUNTS MV A velocity: 86.55 cm/s  Systemic VTI:  0.27 m MV E/A ratio:  0.97        Systemic Diam: 2.10 cm Dalton McleanMD Electronically signed by Wilfred Lacy Signature Date/Time: 02/17/2023/12:53:52 PM    Final     EKG: Orders  placed or performed during the hospital encounter of 02/15/23   EKG 12 lead per protocol   EKG 12 lead per  protocol     Hospital Course: Susan Jenkins is a 77 y.o. who was admitted to [facility]. They were brought to the operating room on 02/28/2023 and underwent Procedure(s): TOTAL KNEE ARTHROPLASTY.  Patient tolerated the procedure well and was later transferred to the recovery room and then to the orthopaedic floor for postoperative care. They were given PO and IV analgesics for pain control following their surgery. They were given 24 hours of postoperative antibiotics of  Anti-infectives (From admission, onward)    Start     Dose/Rate Route Frequency Ordered Stop   03/01/23 0000  vancomycin (VANCOCIN) IVPB 1000 mg/200 mL premix        1,000 mg 200 mL/hr over 60 Minutes Intravenous  Once 02/28/23 1614 03/01/23 0040   02/28/23 1630  ceFAZolin (ANCEF) IVPB 2g/100 mL premix  Status:  Discontinued        2 g 200 mL/hr over 30 Minutes Intravenous Every 6 hours 02/28/23 1535 02/28/23 1614   02/28/23 1119  vancomycin HCl (VANCOREADY) 1500 MG/300ML IVPB       Note to Pharmacy: [facility]ia Phlegm M: cabinet override      02/28/23 1119 02/28/23 2329   02/28/23 0930  ceFAZolin (ANCEF) IVPB 2g/100 mL premix  Status:  Discontinued        2 g 200 mL/hr over 30 Minutes Intravenous On call to O.R. 02/28/23 1610 02/28/23 1530      and started on DVT prophylaxis in the form of Xarelto.   PT and OT were ordered for total joint protocol. Discharge planning consulted to help with postop disposition and equipment needs.  Patient had a fair night on the evening of surgery. They started to get up OOB with therapy on POD #0. Pt was seen during rounds and was ready to go home pending progress with therapy. She worked with therapy on POD #1 and was meeting her goals. Pt was discharged to home later that day in stable condition.  Diet: Regular diet Activity: WBAT Follow-up: in 2 weeks Disposition: Home Discharged Condition: stable   Discharge Instructions     Call MD / Call 911   Complete by: As  directed    If you experience chest pain or shortness of breath, CALL 911 and be transported to the hospital emergency room.  If you develope a fever above 101 F, pus (white drainage) or increased drainage or redness at the wound, or calf pain, call your surgeon's office.   Change dressing   Complete by: As directed    You may remove the bulky bandage (ACE wrap and gauze) two days after surgery. You will have an adhesive waterproof bandage underneath. Leave this in place until your first follow-up appointment.   Constipation Prevention   Complete by: As directed    Drink plenty of fluids.  Prune juice may be helpful.  You may use a stool softener, such as Colace (over the counter) 100 mg twice a day.  Use MiraLax (over the counter) for constipation as needed.   Diet - low sodium heart healthy   Complete by: As directed    Do not put a pillow under the knee. Place it under the heel.   Complete by: As directed    Driving restrictions   Complete by: As directed    No driving for two weeks   Post-operative opioid taper  instructions:   Complete by: As directed    POST-OPERATIVE OPIOID TAPER INSTRUCTIONS: It is important to wean off of your opioid medication as soon as possible. If you do not need pain medication after your surgery it is ok to stop day one. Opioids include: Codeine, Hydrocodone(Norco, Vicodin), Oxycodone(Percocet, oxycontin) and hydromorphone amongst others.  Long term and even short term use of opiods can cause: Increased pain response Dependence Constipation Depression Respiratory depression And more.  Withdrawal symptoms can include Flu like symptoms Nausea, vomiting And more Techniques to manage these symptoms Hydrate well Eat regular healthy meals Stay active Use relaxation techniques(deep breathing, meditating, yoga) Do Not substitute Alcohol to help with tapering If you have been on opioids for less than two weeks and do not have pain than it is ok to stop all  together.  Plan to wean off of opioids This plan should start within one week post op of your joint replacement. Maintain the same interval or time between taking each dose and first decrease the dose.  Cut the total daily intake of opioids by one tablet each day Next start to increase the time between doses. The last dose that should be eliminated is the evening dose.      TED hose   Complete by: As directed    Use stockings (TED hose) for three weeks on both leg(s).  You may remove them at night for sleeping.   Weight bearing as tolerated   Complete by: As directed       Allergies as of 03/01/2023       Reactions   Amoxicillin-pot Clavulanate Anaphylaxis   Has patient had a PCN reaction causing immediate rash, facial/tongue/throat swelling, SOB or lightheadedness with hypotension: Yes Has patient had a PCN reaction causing severe rash involving mucus membranes or skin necrosis: Yes Has patient had a PCN reaction that required hospitalization Yes Has patient had a PCN reaction occurring within the last 10 years: No If all of the above answers are "NO", then may proceed with Cephalosporin use.   Augmentin [amoxicillin-pot Clavulanate] Anaphylaxis   Nickel Rash        Medication List     STOP taking these medications    aspirin 81 MG tablet   ibuprofen 200 MG tablet Commonly known as: ADVIL       TAKE these medications    acetaminophen 650 MG CR tablet Commonly known as: TYLENOL Take 650 mg by mouth in the morning and at bedtime.   AMBULATORY NON FORMULARY MEDICATION 2% Diltiazem ointment Apply to the anal sphincter 5 times daily   amLODipine 10 MG tablet Commonly known as: NORVASC Take 1 tablet (10 mg total) by mouth daily.   CALCIUM 600-VITAMIN D3 PO Take 1 tablet by mouth daily.   diazepam 5 MG tablet Commonly known as: Valium Take one tablet by mouth  one hour prior to your MRI   diltiazem 2 % Gel Apply 1 application. topically 2 (two) times  daily.   FISH OIL PO Take 1 capsule by mouth daily.   GLUCOSAMINE CHONDROITIN ADV PO Take 1 tablet by mouth 2 (two) times daily.   hydrochlorothiazide 25 MG tablet Commonly known as: HYDRODIURIL TAKE 1 TABLET (25 MG TOTAL) BY MOUTH DAILY.   latanoprost 0.005 % ophthalmic solution Commonly known as: XALATAN Place 1 drop into both eyes at bedtime.   loratadine 10 MG tablet Commonly known as: CLARITIN Take 10 mg by mouth daily.   meclizine 25 MG tablet Commonly known as:  ANTIVERT Take 1 tablet (25 mg total) by mouth 3 (three) times daily as needed for dizziness.   MELATONIN PO Take 3 mg by mouth at bedtime.   Metamucil Fiber Chew Chew 2-3 each by mouth daily.   methocarbamol 500 MG tablet Commonly known as: ROBAXIN Take 1 tablet (500 mg total) by mouth every 6 (six) hours as needed for muscle spasms.   MULTIVITAMIN ADULT PO Take 1 tablet by mouth daily.   ondansetron 4 MG tablet Commonly known as: ZOFRAN Take 1 tablet (4 mg total) by mouth every 6 (six) hours as needed for nausea.   oxyCODONE 5 MG immediate release tablet Commonly known as: Oxy IR/ROXICODONE Take 1-2 tablets (5-10 mg total) by mouth every 6 (six) hours as needed for severe pain (pain score 7-10).   PROBIOTIC PO Take 1 capsule by mouth at bedtime.   pyridOXINE 50 MG tablet Commonly known as: VITAMIN B6 Take 50 mg by mouth daily.   rivaroxaban 10 MG Tabs tablet Commonly known as: XARELTO Take 1 tablet (10 mg total) by mouth daily with breakfast for 20 days. Then resume one 81 mg aspirin once a day   rosuvastatin 10 MG tablet Commonly known as: CRESTOR Take 1 tablet (10 mg total) by mouth daily.   timolol 0.5 % ophthalmic solution Commonly known as: TIMOPTIC Place 1 drop into both eyes 2 (two) times daily.   venlafaxine XR 75 MG 24 hr capsule Commonly known as: EFFEXOR-XR TAKE 1 CAPSULE BY MOUTH EVERY DAY               Discharge Care Instructions  (From admission, onward)            Start     Ordered   03/01/23 0000  Weight bearing as tolerated        03/01/23 0839   03/01/23 0000  Change dressing       Comments: You may remove the bulky bandage (ACE wrap and gauze) two days after surgery. You will have an adhesive waterproof bandage underneath. Leave this in place until your first follow-up appointment.   03/01/23 0865            Follow-up Information     Ollen Gross, MD. Go on 03/16/2023.   Specialty: Orthopedic Surgery Why: You are scheduled for first post op appt on Wednesday November 13 at 2:45pm. Contact information: [address] [place] [address] [phone]         Zorita Pang.. Go on 03/03/2023.   Why: You are scheduled for first therapy eval on Thursday October 31 at 10:00am. Contact information: [address] [place] [address] [phone]                 Signed: R. Arcola Jansky, PA-C Orthopedic Surgery 03/04/2023, 3:10 PM

## 2023-03-07 DIAGNOSIS — M25662 Stiffness of left knee, not elsewhere classified: Secondary | ICD-10-CM | POA: Diagnosis not present

## 2023-03-07 DIAGNOSIS — M25562 Pain in left knee: Secondary | ICD-10-CM | POA: Diagnosis not present

## 2023-03-09 DIAGNOSIS — M25562 Pain in left knee: Secondary | ICD-10-CM | POA: Diagnosis not present

## 2023-03-09 DIAGNOSIS — M25662 Stiffness of left knee, not elsewhere classified: Secondary | ICD-10-CM | POA: Diagnosis not present

## 2023-03-11 DIAGNOSIS — M25562 Pain in left knee: Secondary | ICD-10-CM | POA: Diagnosis not present

## 2023-03-11 DIAGNOSIS — M25662 Stiffness of left knee, not elsewhere classified: Secondary | ICD-10-CM | POA: Diagnosis not present

## 2023-03-14 DIAGNOSIS — M25562 Pain in left knee: Secondary | ICD-10-CM | POA: Diagnosis not present

## 2023-03-14 DIAGNOSIS — M25662 Stiffness of left knee, not elsewhere classified: Secondary | ICD-10-CM | POA: Diagnosis not present

## 2023-03-16 DIAGNOSIS — M25662 Stiffness of left knee, not elsewhere classified: Secondary | ICD-10-CM | POA: Diagnosis not present

## 2023-03-16 DIAGNOSIS — M25562 Pain in left knee: Secondary | ICD-10-CM | POA: Diagnosis not present

## 2023-03-18 DIAGNOSIS — M25562 Pain in left knee: Secondary | ICD-10-CM | POA: Diagnosis not present

## 2023-03-18 DIAGNOSIS — M25662 Stiffness of left knee, not elsewhere classified: Secondary | ICD-10-CM | POA: Diagnosis not present

## 2023-03-21 DIAGNOSIS — M25662 Stiffness of left knee, not elsewhere classified: Secondary | ICD-10-CM | POA: Diagnosis not present

## 2023-03-21 DIAGNOSIS — M25562 Pain in left knee: Secondary | ICD-10-CM | POA: Diagnosis not present

## 2023-03-23 ENCOUNTER — Ambulatory Visit (HOSPITAL_BASED_OUTPATIENT_CLINIC_OR_DEPARTMENT_OTHER)
Admission: RE | Admit: 2023-03-23 | Discharge: 2023-03-23 | Disposition: A | Discharge status: Home / Self Care | Payer: PPO | Source: Ambulatory Visit | Attending: Cardiovascular Disease | Admitting: Cardiovascular Disease

## 2023-03-23 ENCOUNTER — Other Ambulatory Visit: Payer: Self-pay | Admitting: Cardiovascular Disease

## 2023-03-23 ENCOUNTER — Ambulatory Visit (HOSPITAL_COMMUNITY)
Admission: RE | Admit: 2023-03-23 | Discharge: 2023-03-23 | Disposition: A | Discharge status: Home / Self Care | Payer: PPO | Source: Ambulatory Visit | Attending: Cardiovascular Disease | Admitting: Cardiovascular Disease

## 2023-03-23 DIAGNOSIS — I421 Obstructive hypertrophic cardiomyopathy: Secondary | ICD-10-CM

## 2023-03-23 DIAGNOSIS — I10 Essential (primary) hypertension: Secondary | ICD-10-CM | POA: Diagnosis not present

## 2023-03-23 DIAGNOSIS — I422 Other hypertrophic cardiomyopathy: Secondary | ICD-10-CM | POA: Diagnosis not present

## 2023-03-23 DIAGNOSIS — I253 Aneurysm of heart: Secondary | ICD-10-CM

## 2023-03-23 DIAGNOSIS — Z01818 Encounter for other preprocedural examination: Secondary | ICD-10-CM

## 2023-03-23 MED ORDER — GADOBUTROL 1 MMOL/ML IV SOLN
10.0000 mL | Freq: Once | INTRAVENOUS | Status: AC | PRN
Start: 2023-03-23 — End: 2023-03-23
  Administered 2023-03-23: 10 mL via INTRAVENOUS

## 2023-03-24 DIAGNOSIS — M9901 Segmental and somatic dysfunction of cervical region: Secondary | ICD-10-CM | POA: Diagnosis not present

## 2023-03-24 DIAGNOSIS — M62830 Muscle spasm of back: Secondary | ICD-10-CM | POA: Diagnosis not present

## 2023-03-24 DIAGNOSIS — M542 Cervicalgia: Secondary | ICD-10-CM | POA: Diagnosis not present

## 2023-03-24 DIAGNOSIS — M9903 Segmental and somatic dysfunction of lumbar region: Secondary | ICD-10-CM | POA: Diagnosis not present

## 2023-03-25 ENCOUNTER — Telehealth: Payer: Self-pay | Admitting: Cardiovascular Disease

## 2023-03-25 DIAGNOSIS — M25562 Pain in left knee: Secondary | ICD-10-CM | POA: Diagnosis not present

## 2023-03-25 DIAGNOSIS — I421 Obstructive hypertrophic cardiomyopathy: Secondary | ICD-10-CM

## 2023-03-25 DIAGNOSIS — M25662 Stiffness of left knee, not elsewhere classified: Secondary | ICD-10-CM | POA: Diagnosis not present

## 2023-03-25 NOTE — Telephone Encounter (Signed)
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 Although no SAM she has mid ventricular obstruction from hypertrophied papillary muscles Refer to [facility] ? Candidate for [employer]

## 2023-03-25 NOTE — Telephone Encounter (Signed)
Susan Jenkins with [facility] called asking based on pt's most recent tests here she wants to know if pt is okay to have physical therapy for her knees.

## 2023-03-25 NOTE — Telephone Encounter (Signed)
Patient wants to know if she can continue her regular activities, Physical Therapy for her knee, and start aqua aerobics. Will forward to Dr. Eden Emms for advisement.  Will place order for referral to Dr. Izora Ribas.

## 2023-03-25 NOTE — Telephone Encounter (Signed)
Left detailed message to [facility] that ok for PT per Dr. Eden Emms

## 2023-03-28 DIAGNOSIS — M25562 Pain in left knee: Secondary | ICD-10-CM | POA: Diagnosis not present

## 2023-03-28 DIAGNOSIS — M25662 Stiffness of left knee, not elsewhere classified: Secondary | ICD-10-CM | POA: Diagnosis not present

## 2023-04-01 ENCOUNTER — Ambulatory Visit (HOSPITAL_COMMUNITY): Admission: RE | Admit: 2023-04-01 | Payer: PPO | Source: Ambulatory Visit

## 2023-04-04 DIAGNOSIS — M25562 Pain in left knee: Secondary | ICD-10-CM | POA: Diagnosis not present

## 2023-04-04 DIAGNOSIS — M25662 Stiffness of left knee, not elsewhere classified: Secondary | ICD-10-CM | POA: Diagnosis not present

## 2023-04-05 DIAGNOSIS — Z471 Aftercare following joint replacement surgery: Secondary | ICD-10-CM | POA: Diagnosis not present

## 2023-04-14 DIAGNOSIS — M9901 Segmental and somatic dysfunction of cervical region: Secondary | ICD-10-CM | POA: Diagnosis not present

## 2023-04-14 DIAGNOSIS — M62830 Muscle spasm of back: Secondary | ICD-10-CM | POA: Diagnosis not present

## 2023-04-14 DIAGNOSIS — M542 Cervicalgia: Secondary | ICD-10-CM | POA: Diagnosis not present

## 2023-04-14 DIAGNOSIS — M9903 Segmental and somatic dysfunction of lumbar region: Secondary | ICD-10-CM | POA: Diagnosis not present

## 2023-05-05 NOTE — Telephone Encounter (Signed)
 Patient is scheduled for HOCM consults on 02/03.

## 2023-05-12 DIAGNOSIS — M9903 Segmental and somatic dysfunction of lumbar region: Secondary | ICD-10-CM | POA: Diagnosis not present

## 2023-05-12 DIAGNOSIS — M542 Cervicalgia: Secondary | ICD-10-CM | POA: Diagnosis not present

## 2023-05-12 DIAGNOSIS — M9901 Segmental and somatic dysfunction of cervical region: Secondary | ICD-10-CM | POA: Diagnosis not present

## 2023-05-12 DIAGNOSIS — M62830 Muscle spasm of back: Secondary | ICD-10-CM | POA: Diagnosis not present

## 2023-05-18 DIAGNOSIS — H401122 Primary open-angle glaucoma, left eye, moderate stage: Secondary | ICD-10-CM | POA: Diagnosis not present

## 2023-05-18 DIAGNOSIS — H401111 Primary open-angle glaucoma, right eye, mild stage: Secondary | ICD-10-CM | POA: Diagnosis not present

## 2023-05-23 ENCOUNTER — Other Ambulatory Visit: Payer: Self-pay | Admitting: Family Medicine

## 2023-05-23 DIAGNOSIS — I10 Essential (primary) hypertension: Secondary | ICD-10-CM

## 2023-05-23 DIAGNOSIS — E785 Hyperlipidemia, unspecified: Secondary | ICD-10-CM

## 2023-05-23 DIAGNOSIS — F339 Major depressive disorder, recurrent, unspecified: Secondary | ICD-10-CM

## 2023-06-06 ENCOUNTER — Ambulatory Visit (INDEPENDENT_AMBULATORY_CARE_PROVIDER_SITE_OTHER): Payer: PPO

## 2023-06-06 ENCOUNTER — Ambulatory Visit: Payer: PPO | Admitting: Genetic Counselor

## 2023-06-06 ENCOUNTER — Ambulatory Visit: Payer: PPO | Attending: Internal Medicine | Admitting: Internal Medicine

## 2023-06-06 VITALS — BP 132/60 | HR 64 | Ht 66.0 in | Wt 231.0 lb

## 2023-06-06 DIAGNOSIS — E782 Mixed hyperlipidemia: Secondary | ICD-10-CM | POA: Diagnosis not present

## 2023-06-06 DIAGNOSIS — I422 Other hypertrophic cardiomyopathy: Secondary | ICD-10-CM | POA: Diagnosis not present

## 2023-06-06 DIAGNOSIS — R001 Bradycardia, unspecified: Secondary | ICD-10-CM | POA: Diagnosis not present

## 2023-06-06 DIAGNOSIS — I253 Aneurysm of heart: Secondary | ICD-10-CM

## 2023-06-06 DIAGNOSIS — I10 Essential (primary) hypertension: Secondary | ICD-10-CM

## 2023-06-06 NOTE — Patient Instructions (Signed)
Medication Instructions:  Your physician recommends that you continue on your current medications as directed. Please refer to the Current Medication list given to you today.  *If you need a refill on your cardiac medications before your next appointment, please call your pharmacy*   Lab Work: NONE If you have labs (blood work) drawn today and your tests are completely normal, you will receive your results only by: MyChart Message (if you have MyChart) OR A paper copy in the mail If you have any lab test that is abnormal or we need to change your treatment, we will call you to review the results.   Testing/Procedures: Your physician has requested that you wear a heart monitor.    Follow-Up: At [facility], you and your health needs are our priority.  As part of our continuing mission to provide you with exceptional heart care, we have created designated Provider Care Teams.  These Care Teams include your primary Cardiologist (physician) and Advanced Practice Providers (APPs -  Physician Assistants and Nurse Practitioners) who all work together to provide you with the care you need, when you need it.    Your next appointment:   8-9 month(s)  Provider:   Riley Lam, MD    Other Instructions [name]- Long Term Monitor Instructions  Your physician has requested you wear a ZIO patch monitor for 14 days.  This is a single patch monitor. [employer] supplies one patch monitor per enrollment. Additional stickers are not available. Please do not apply patch if you will be having a Nuclear Stress Test,   Cardiac CT, MRI, or Chest Xray during the period you would be wearing the  monitor. The patch cannot be worn during these tests. You cannot remove and re-apply the  ZIO XT patch monitor.  Your ZIO patch monitor will be mailed 3 day USPS to your address on file. It may take 3-5 days  to receive your monitor after you have been enrolled.  Once you have received your monitor,  please review the enclosed instructions. Your monitor  has already been registered assigning a specific monitor serial # to you.  Billing and Patient Assistance Program Information  We have supplied [employer] with any of your insurance information on file for billing purposes. [employer] offers a sliding scale Patient Assistance Program for patients that do not have  insurance, or whose insurance does not completely cover the cost of the ZIO monitor.  You must apply for the Patient Assistance Program to qualify for this discounted rate.  To apply, please call [employer] at [phone], select option 4, select option 2, ask to apply for  Patient Assistance Program. [name] will ask your household income, and how many people  are in your household. They will quote your out-of-pocket cost based on that information.  [employer] will also be able to set up a 52-month, interest-free payment plan if needed.  Applying the monitor   Shave hair from upper left chest.  Hold abrader disc by orange tab. Rub abrader in 40 strokes over the upper left chest as  indicated in your monitor instructions.  Clean area with 4 enclosed alcohol pads. Let dry.  Apply patch as indicated in monitor instructions. Patch will be placed under collarbone on left  side of chest with arrow pointing upward.  Rub patch adhesive wings for 2 minutes. Remove white label marked "1". Remove the white  label marked "2". Rub patch adhesive wings for 2 additional minutes.  While looking in a mirror, press  and release button in center of patch. A small green light will  flash 3-4 times. This will be your only indicator that the monitor has been turned on.  Do not shower for the first 24 hours. You may shower after the first 24 hours.  Press the button if you feel a symptom. You will hear a small click. Record Date, Time and  Symptom in the Patient Logbook.  When you are ready to remove the patch, follow instructions on the last 2 pages of  Patient  Logbook. Stick patch monitor onto the last page of Patient Logbook.  Place Patient Logbook in the blue and white box. Use locking tab on box and tape box closed  securely. The blue and white box has prepaid postage on it. Please place it in the mailbox as  soon as possible. Your physician should have your test results approximately 7 days after the  monitor has been mailed back to [facility].  Call [facility] Customer Care at [phone] if you have questions regarding  your ZIO XT patch monitor. Call them immediately if you see an orange light blinking on your  monitor.  If your monitor falls off in less than 4 days, contact our Monitor department at [phone].  If your monitor becomes loose or falls off after 4 days call [employer] at [phone] for  suggestions on securing your monitor

## 2023-06-06 NOTE — Progress Notes (Signed)
Cardiology Office Note:  .    Date:  06/06/2023  ID:  Amanpreet Delmont, DOB Feb 18, 1946, MRN [id number] PCP: Deeann Saint, MD  [facility] Providers Cardiologist:  Charlton Haws, MD     CC: Apical HCM with Aneurysm Consulted for the evaluation of Apical HCM at the behest of Dr. Eden Emms   History of Present Illness: .    Susan Jenkins is a 78 y.o. 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  The patient is a 78 year old female with hypertrophic cardiomyopathy who presents for follow-up after knee surgery clearance. She was referred by Dr. Eden Emms for evaluation of her heart condition prior to knee surgery.  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 No heart pain, chest discomfort, palpitations, or syncope. She experiences chronic tiredness, which she manages by taking naps and remaining active. Shortness of breath occurs only during exertion, such as climbing stairs, but not during routine activities.  She underwent knee surgery in October after being cleared by Dr. Eden Emms, following a concern raised by the anesthesiologist during pre-operative evaluation. The surgery was successful, and she has since returned to activities such as water aerobics and senior softball.  Her family history includes her father having heart surgery and bypass due to coronary artery disease. She has four sisters, one of whom is a step-sister, and none have been diagnosed with hypertrophic cardiomyopathy or have experienced sudden cardiac events.   Relevant histories: .  Social - has four other sisters, no SCD, no HCM, father had CAD ROS: As per HPI.   Studies Reviewed: .   Cardiac Studies & Procedures      ECHOCARDIOGRAM  ECHOCARDIOGRAM COMPLETE  02/17/2023  Narrative ECHOCARDIOGRAM REPORT    Patient Name:   Susan Jenkins Date of Exam: 02/17/2023 Medical Rec #:  [id number]              Height:       66.0 in Accession #:    [id number]             Weight:       237.0 lb Date of Birth:  11-12-1945              BSA:          2.149 m Patient Age:    76 years               BP:           124/70 mmHg Patient Gender: F                      HR:           59 bpm. Exam Location:  [address]  Procedure: 2D Echo, Cardiac Doppler, Color Doppler and Intracardiac Opacification Agent  Indications:    I42.1 HOCM  History:        Patient has no prior history of Echocardiogram examinations. Hypertrophic Cardiomyopathy; Risk Factors:Family History of Coronary Artery Disease, Hypertension, Dyslipidemia and Former Smoker. Suspected HOCM with Apical Ballooning, History of Pulmonary Embolism, Pre-Operative Eval for Knee Replacement.  Sonographer:    Farrel Conners RDCS Referring Phys: Deeann Saint  IMPRESSIONS   1. Left ventricular ejection fraction, by estimation, is 60 to  65%. The left ventricle has normal function. The left ventricle demonstrates regional wall motion abnormalities with a small dyskinetic pouch at the true apex. There is asymmetric moderate-severe apical hypertrophy with the small apical dyskinetic pouch seen best in the 4-chamber view. There is a mid-cavity LV gradient, peak around 30 mmHg. Left ventricular diastolic parameters are consistent with Grade I diastolic dysfunction (impaired relaxation). 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 Consider cardiac MRI for delayed enhancement evaluation/risk stratification. 2. Right ventricular systolic function is mildly reduced. The right ventricular size is normal. Tricuspid regurgitation signal is inadequate for assessing PA pressure. 3. Left atrial size was moderately dilated. 4. The mitral valve is normal in structure.  Trivial mitral valve regurgitation. No evidence of mitral stenosis. 5. The aortic valve is tricuspid. There is mild calcification of the aortic valve. Aortic valve regurgitation is mild. Aortic valve sclerosis/calcification is present, without any evidence of aortic stenosis. 6. The inferior vena cava is normal in size with greater than 50% respiratory variability, suggesting right atrial pressure of 3 mmHg. 7. Cystic structure in liver measuring 5.5 x 4.3 cm, consider dedicated ultrasound.  FINDINGS Left Ventricle: Left ventricular ejection fraction, by estimation, is 60 to 65%. The left ventricle has normal function. The left ventricle demonstrates regional wall motion abnormalities. Definity contrast agent was given IV to delineate the left ventricular endocardial borders. The left ventricular internal cavity size was normal in size. There is asymmetric moderate-severe apical hypertrophy with a small apical dyskinetic pouch seen best in the 4-chamber view left ventricular hypertrophy. Left ventricular diastolic parameters are consistent with Grade I diastolic dysfunction (impaired relaxation).  Right Ventricle: The right ventricular size is normal. No increase in right ventricular wall thickness. Right ventricular systolic function is mildly reduced. Tricuspid regurgitation signal is inadequate for assessing PA pressure.  Left Atrium: Left atrial size was moderately dilated.  Right Atrium: Right atrial size was normal in size.  Pericardium: Trivial pericardial effusion is present.  Mitral Valve: The mitral valve is normal in structure. Trivial mitral valve regurgitation. No evidence of mitral valve stenosis.  Tricuspid Valve: The tricuspid valve is normal in structure. Tricuspid valve regurgitation is trivial.  Aortic Valve: The aortic valve is tricuspid. There is mild calcification of the aortic valve. Aortic valve regurgitation is mild. Aortic regurgitation PHT measures 562 msec. Aortic  valve sclerosis/calcification is present, without any evidence of aortic stenosis. Aortic valve mean gradient measures 8.0 mmHg. Aortic valve peak gradient measures 14.9 mmHg. Aortic valve area, by VTI measures 2.05 cm.  Pulmonic Valve: The pulmonic valve was normal in structure. Pulmonic valve regurgitation is trivial.  Aorta: The aortic root is normal in size and structure.  Venous: The inferior vena cava is normal in size with greater than 50% respiratory variability, suggesting right atrial pressure of 3 mmHg.  IAS/Shunts: No atrial level shunt detected by color flow Doppler.   LEFT VENTRICLE PLAX 2D LVIDd:         5.60 cm   Diastology LVIDs:         3.00 cm   LV e' medial:    5.72 cm/s LV PW:         1.30 cm   LV E/e' medial:  14.6 LV IVS:        1.10 cm   LV e' lateral:   8.58 cm/s LVOT diam:     2.10 cm   LV E/e' lateral: 9.8 LV SV:         94  LV SV Index:   44 LVOT Area:     3.46 cm   RIGHT VENTRICLE RV Basal diam:  3.30 cm RV S prime:     17.70 cm/s TAPSE (M-mode): 2.4 cm  LEFT ATRIUM              Index        RIGHT ATRIUM           Index LA diam:        5.50 cm  2.56 cm/m   RA Pressure: 3.00 mmHg LA Vol (A2C):   89.2 ml  41.50 ml/m  RA Area:     13.40 cm LA Vol (A4C):   111.0 ml 51.64 ml/m  RA Volume:   32.40 ml  15.07 ml/m LA Biplane Vol: 101.0 ml 46.99 ml/m AORTIC VALVE AV Area (Vmax):    1.97 cm AV Area (Vmean):   1.97 cm AV Area (VTI):     2.05 cm AV Vmax:           193.00 cm/s AV Vmean:          126.000 cm/s AV VTI:            0.456 m AV Peak Grad:      14.9 mmHg AV Mean Grad:      8.0 mmHg LVOT Vmax:         110.00 cm/s LVOT Vmean:        71.800 cm/s LVOT VTI:          0.270 m LVOT/AV VTI ratio: 0.59 AI PHT:            562 msec  AORTA Ao Root diam: 3.50 cm Ao Asc diam:  3.50 cm  MITRAL VALVE               TRICUSPID VALVE MV Area (PHT): cm         Estimated RAP:  3.00 mmHg MV Decel Time: 190 msec MV E velocity: 83.70 cm/s  SHUNTS MV  A velocity: 86.55 cm/s  Systemic VTI:  0.27 m MV E/A ratio:  0.97        Systemic Diam: 2.10 cm  Dalton McleanMD Electronically signed by Wilfred Lacy Signature Date/Time: 02/17/2023/12:53:52 PM    Final    CT SCANS  CT CARDIAC SCORING (SELF PAY ONLY) 03/23/2023  Addendum 04/01/2023 11:09 AM ADDENDUM REPORT: 04/01/2023 11:07  EXAM: OVER-READ INTERPRETATION  CT CHEST  The following report is an over-read performed by radiologist Dr. Royal Piedra [facility] on 04/01/2023. This over-read does not include interpretation of cardiac or coronary anatomy or pathology. The coronary calcium score interpretation by the cardiologist is attached.  COMPARISON:  None available.  FINDINGS: Small amount of pericardial fluid and/or thickening. No pericardial calcification. Within the visualized portions of the thorax there are no suspicious appearing pulmonary nodules or masses, there is no acute consolidative airspace disease, no pleural effusions, no pneumothorax and no lymphadenopathy. Dilatation of the pulmonic trunk (3.8 cm in diameter). Visualized portions of the upper abdomen low-attenuation lesions in segment 2 of the liver, incompletely characterized on today's noncontrast CT examination, but statistically likely to represent cysts (no imaging follow-up recommended). There are no aggressive appearing lytic or blastic lesions noted in the visualized portions of the skeleton.  IMPRESSION: 1. Small amount of pericardial fluid and/or thickening. No pericardial calcification. 2. Dilatation of the pulmonic trunk (3.8 cm in diameter) concerning for potential pulmonary arterial hypertension.   Electronically Signed By: Brayton Mars.D.  On: 04/01/2023 11:07  Narrative CLINICAL DATA:  Cardiovascular Disease Risk stratification  EXAM: Coronary Calcium Score  TECHNIQUE: A gated, non-contrast computed tomography scan of the heart was performed using  3mm slice thickness. Axial images were analyzed on a dedicated workstation. Calcium scoring of the coronary arteries was performed using the Agatston method.  FINDINGS: Coronary arteries: Normal origins.  Coronary Calcium Score:  Left main: 0  Left anterior descending artery: 149  Left circumflex artery: 0  Right coronary artery: 5  Total: 154  Percentile: 68  Pericardium: Moderate thickening of pericardium  Mitral annular calcification  Abnormal appearance of LV apex, not well characterized on noncontrast study.  Aorta: Normal caliber of ascending aorta. Aortic atherosclerosis noted.  Non-cardiac: See separate report from [facility].  IMPRESSION: Coronary calcium score of 154. This was 68th percentile for age-, race-, and sex-matched controls.  Aortic atherosclerosis  Moderate thickening of pericardium  Mitral annular calcification  Abnormal appearance of LV apex, not well characterized on noncontrast study.  RECOMMENDATIONS: Coronary artery calcium (CAC) score is a strong predictor of incident coronary heart disease (CHD) and provides predictive information beyond traditional risk factors. CAC scoring is reasonable to use in the decision to withhold, postpone, or initiate statin therapy in intermediate-risk or selected borderline-risk asymptomatic adults (age 63-75 years and LDL-C >=70 to <190 mg/dL) who do not have diabetes or established atherosclerotic cardiovascular disease (ASCVD).* In intermediate-risk (10-year ASCVD risk >=7.5% to <20%) adults or selected borderline-risk (10-year ASCVD risk >=5% to <7.5%) adults in whom a CAC score is measured for the purpose of making a treatment decision the following recommendations have been made:  If CAC=0, it is reasonable to withhold statin therapy and reassess in 5 to 10 years, as long as higher risk conditions are absent (diabetes mellitus, family history of premature CHD in first  degree relatives (males <55 years; females <65 years), cigarette smoking, or LDL >=190 mg/dL).  If CAC is 1 to 99, it is reasonable to initiate statin therapy for patients >=78 years of age.  If CAC is >=100 or >=75th percentile, it is reasonable to initiate statin therapy at any age.  Cardiology referral should be considered for patients with CAC scores >=400 or >=75th percentile.  *2018 AHA/ACC/AACVPR/AAPA/ABC/ACPM/ADA/AGS/APhA/ASPC/NLA/PCNA Guideline on the Management of Blood Cholesterol: A Report of the American College of Cardiology/American Heart Association Task Force on Clinical Practice Guidelines. J Am Coll Cardiol. 2019;73(24):3168-3209.  Jodelle Red, MD  Electronically Signed: By: Jodelle Red M.D. On: 03/24/2023 17:28   CT SCANS  CT CORONARY MORPH W/CTA COR W/SCORE 04/06/2010  Narrative Addendum Begins OVER-READ INTERPRETATION - CT CHEST  The following report is an over-read performed by radiologist Dr. Blair Hailey. Manson Passey, M.D. of [facility] on 04/06/2010 16:37:41.  This over-read does not include interpretation of cardiac or coronary anatomy or pathology.  The CTA interpretation by the cardiologist is attached.  Comparison:  Chest x-ray 10/05/2004  Findings: There is a trace of pericardial effusion.  There is question of a left ventricular aneurysm, demonstrated on axial image 257.  No pleural effusions, infiltrates.  No pulmonary nodules are identified.  Degenerative changes are seen in the thoracic spine. No mediastinal, hilar, or axillary adenopathy.  IMPRESSION:  1.  Trace pericardial effusion. 2.  Question of ventricular aneurysm.  Cardiac CT:  Indication: Chest Pain Abnormal ECG  Protocol:  The patient was scanned on a Philips 256 scanner. Gantry rotation was with colimation set at .9mm.  SL nitro was given.  No  beta blocker was needed.  Average HR during scan was 58 bpm.  A 120 kV prospective  scan was done with 5% phase tolerance around 78% of the R-R interval.  Bolus tracking in the descending thoracic aorta was used to trigger the scan.  The 3D data set was viewed on a [employer].  Findings:  Noncardiac:  Reviewed by [facility] No significant soft tissue or lung findings  Calcium Score: 0  Coronary Arteries: No anomalies.  Right dominant.  LM normal LAD normal, D1 and D2 normal, Circumflex normal, OM! Large and normal. RCA dominant and normal  LV:  Although no functional images were done in this prospective scan the morphology of the LV apex was abnormal.  The patient likely has apical hypertrophic cardiomyopathy with a mid ventricular tunnel and apical ballooning.  Impression:  1)    Calcium Score 0  2)    Normal right dominant coronary arteries 3)    No significant lung or soft tissue findings 4)    Abnormal LV apex.  Suggest correlation with echo or MRI. 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 5)    Small pericardial effusion  Addendum Ends OVER-READ INTERPRETATION - CT CHEST  The following report is an over-read performed by radiologist Dr. Blair Hailey. Manson Passey, M.D. of [facility] on 04/06/2010 16:37:41.  This over-read does not include interpretation of cardiac or coronary anatomy or pathology.  The CTA interpretation by the cardiologist is attached.  Comparison:  Chest x-ray 10/05/2004  Findings: There is a trace of pericardial effusion.  There is question of a left ventricular aneurysm, demonstrated on axial image 257.  No pleural effusions, infiltrates.  No pulmonary nodules are identified.  Degenerative changes are seen in the thoracic spine. No mediastinal, hilar, or axillary adenopathy.  IMPRESSION:  1.  Trace pericardial effusion. 2.  Question of ventricular aneurysm.  Provider: Tama High  CARDIAC MRI  MR CARDIAC MORPHOLOGY W WO CONTRAST  03/23/2023  Narrative CLINICAL DATA:  R/O Hypertrophic Cardiomyopathy  EXAM: CARDIAC MRI  TECHNIQUE: The patient was scanned on a 1.5 Tesla Siemens magnet. A dedicated cardiac coil was used. Functional imaging was done using Fiesta sequences. 2,3, and 4 chamber views were done to assess for RWMA's. Modified Simpson's rule using a short axis stack was used to calculate an ejection fraction on a dedicated work [employer]. The patient received 10 cc of Gadavist. After 10 minutes inversion recovery sequences were used to assess for infiltration and scar tissue.  CONTRAST:  Gadavist  FINDINGS: Moderate LAE. Normal RA/RV. No ASD/PFO. Normal ascending thoracic aorta 3.5 cm. Tri leaflet AV normal. Normal MV with no SAM and trivial MR. Morphologically patient has mid/apical variant hypertrophic cardiomyopathy. Basel septal thickness 19 mm. Hypertrophied apically displaced papillary muscles with mid cavitary obstruction in systole. The is a true apical aneurysm Delayed gadolinium images show full thickness scar in the true apex. There is also sub epicardial scar in the basal septum and mid inferior wall.  LVEF: 61% (EDV 169 cc ESV 67 cc SV 103 cc) Estimated cardiac output 2.3 L/min  RVEF: 58% (EDV 139 cc ESV 59 cc SV 80 cc )  Parametric Measures: Using Hct 33  T1: Mildly elevated 1090 msec  ECV mildly elevated 30%  T2 normal 45 msec  IMPRESSION: 1. LV morphology consistent with mid/apical variant hypertrophic cardiomyopathy with apical aneurysm  2.  LVEF 61% RVEF 58%  3.  Mid cavitary obstruction from hypertrophied papillary muscles  4.  Mildy elevated T1 and ECV consistent with fibrosis not necessarily infiltration  5. Gaolinium enhancement full thickness in true apex as well as sub epicardial in basal septum and mid inferior wall  6.  Moderate LAE  7.  Triival appearing MR  8.  Decreased cardiac output 2.3 L/min  Charlton Haws   Electronically Signed By: Charlton Haws M.D. On: 03/24/2023 10:22          Physical Exam:    VS:  BP 132/60 (BP Location: Right Arm)   Pulse 64   Ht 5\' 6"  (1.676 m)   Wt 231 lb (104.8 kg)   SpO2 94%   BMI 37.28 kg/m    Wt Readings from Last 3 Encounters:  06/06/23 231 lb (104.8 kg)  02/28/23 238 lb (108 kg)  02/22/23 238 lb (108 kg)    Gen: no distress, morbid obesity  Neck: No JVD Cardiac: No Rubs or Gallops, systolic murmur, RRR +2 radial pulses Respiratory: Clear to auscultation bilaterally, normal effort, normal  respiratory rate GI: Soft, nontender, non-distended  MS: trace left tibial  edema;  moves all extremities Integument: Skin feels warm Neuro:  At time of evaluation, alert and oriented to person/place/time/situation  Psych: Normal affect, patient feels ok   ASSESSMENT AND PLAN: .    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 Asymptomatic with no chest pain, palpitations, or syncope. Primary concerns include the risk of sudden cardiac death (2.4% over five years, reduced to 1.06% if NSVT) and stroke due to potential arrhythmias. Discussed genetic nature, monitoring for atrial fibrillation and nonsustained ventricular tachycardia, and the potential need for anticoagulation if atrial fibrillation is detected. Emphasized that well-managed hypertrophic cardiomyopathy patients have a normal life expectancy. - Order 2-week non-live Zio patch heart monitor - Refer to Dr. Brent Bulla for genetic counseling and family screening, suspect we will do echo screening; she has no kids - - Schedule follow-up in Fall 2025 to discuss new FDA-approved treatments for nonobstructive hypertrophic cardiomyopathy - Provided educational resources on hypertrophic cardiomyopathy and the importance of regular  follow-ups. Advised to inform healthcare providers about the diagnosis, especially in emergencies. - Provide educational materials from the American Heart Association         Sinus Bradycardia Sinus bradycardia noted with LVH, to be monitored for significant arrhythmias related to NSVT and AF. - Monitor heart rhythm with 2-week non-Lyme Zio patch  HTN  HLD with morbid obesity - Encourage continued physical activity within tolerance   Follow-up - Follow-up appointment in Fall 2025.  Time Spent Directly with Patient:   I have spent a total of 64 minutes with the patient reviewing notes, imaging, EKGs, labs, and examining the patient as well as establishing an assessment and plan that was discussed personally with the patient. Discussed disease state education  and reviewing her questions using [facility]'s 2024 data in regard to shared decision making for [facility]. Reviewed care and plan in collaboration with clinical genetics.   Susan Lam, MD FASE [facility] Cardiologist [facility]  [address] [place], [address] [place], [place] [address] [phone]  9:58 AM

## 2023-06-06 NOTE — Progress Notes (Unsigned)
Enrolled for [employer] to mail a ZIO XT long term holter monitor to the patients address on file.   Dr. Chandrasekhar to read. 

## 2023-06-07 NOTE — Progress Notes (Cosign Needed)
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   Personal Medical Information Susan Jenkins (III.1 on pedigree) is a 78 year old retired Caucasian lady who used to previously work in the support services of an insurance entity. She tells me that a mild cardiac thickening was detected in 2011 and Dr. Jens Som had mentioned to her of being suspicious of HCM. But no further screening was performed until later in 2024 when she needed cardiac clearance for left knee replacement surgery. Cardiac MRI of 03/10/23 detected mid to apical cardiac wall thickening with basal septum of 1.9 cm, LVEF of 61%, true apical aneurysm, and scarring in the apical region.  Cala denies having symptoms of dyspnea, fatigue, chest discomfort, heart palpitations, dizziness or syncope.  Traditional Risk Factors Zimal was diagnosed with HTN in her 72s and states that it is well controlled with medication.  Family history  Relation to Proband Pedigree # Current age Heart condition/age of onset Notes  Children None           Sisters, 3 III.2- III.4 71, 70, 69 III.2- CAD , CABG @ 46 Echo/EKG to be done  Nephew, nieces IV.1-IV.3 44, 40, 35 None         Father II.15 Deceased CAD @ 41s Died @ 63- aspiration, coughed up blood at hospital but was not attended to in a timely manner  Paternal uncles, 13 II.2-II.13 Deceased Unaware Died of ? @  ?  Paternal aunt II.1 Deceased None Died of cancer @ ?  Paternal grandfather 1.1 Deceased Unaware Died of ? @  ?  Paternal grandmother I.2 Deceased Unaware Died of ? @  ?        Mother II.16 Deceased None Died @ 80-Alzheimer's disease  Maternal aunts, 2 II.20-II.21 Deceased None Died @ 70s-Alzheimer's disease  Maternal uncles, 3 II.17-II.19 Deceased None Died @ 70s-Alzheimer's disease  Maternal grandfather I.3 Deceased Unaware Died of ? @  ?  Maternal grandmother I.4 Deceased Unaware Died of ?  @  ?   Genetics Joylene was counseled on the genetics of hypertrophic cardiomyopathy (HCM). I explained to the patient that this is an autosomal dominant condition with incomplete penetrance i.e. not all individuals harboring the HCM mutation will present clinically with HCM, and age-related penetrance where clinical presentation of HCM increases with advanced age. Variability in clinical expression is also seen in families with HCM with affected family members presenting clinically at different ages and with symptoms ranging from mild to severe.  Since HCM is an autosomal dominant condition, first degree-relatives are at a 50% risk of inheriting this condition. They should seek regular surveillance for HCM.  First-degree relatives include her three sisters. At this time her nephews and nieces do not need to undergo screening- they can be screened if they are symptomatic or if their parent is found to have HCM.    Clinical screening of first-degree relatives involves echocardiogram and EKG at regular intervals, frequency is typically determined by age, with children undergoing screening every year until the age of 75 and those over the age of 82 getting screened every 3-5 years until the age of 30. Patient verbalized understanding of this.  Also briefly discussed the inheritance pattern and treatment /management plans for the infiltrative cardiomyopathies that present as HCM phenocopies. About 8-10% of HCM patients can have compound and digenic sarcomeric mutations for HCM  Patient should be aware that genetic testing is  a probabilistic test dependent upon age and severity of presentation, presence of risk factors for HCM and importantly family history of HCM or sudden death in first-degree relatives. The potential outcomes of genetic testing and subsequent management of at-risk family members is listed below-  If a mutation is not identified then it is important that he understands that HCM is a genetic  condition and can be passed down to his children. All first-degree relatives should undergo regular screening for HCM.  A negative test result can be due to limitations of the genetic test.   There is also the likelihood of identifying a "Variant of unknown significance". This result means that the variant has not been detected in a statistically significant number of HCM patients and/or functional studies have not been performed to verify its pathogenicity. This VUS can be tested in the family to see if it segregates with disease. If a VUS is found, first-degree relatives should undergo regular clinical screening for HCM, but genetic testing for the VUS is otherwise not warranted.  If a pathogenic variant is reported, then first-degree family members can get tested for this variant. If they test positive, it is likely they will develop HCM. In light of variable expression and incomplete penetrance associated with HCM, it is not possible to predict when they will manifest clinically with HCM. It is recommended that family members that test positive for the familial pathogenic variant pursue clinical screening for HCM. Family members that test negative for the familial mutation need not pursue periodic screening for HCM, but seek care if symptoms develop.   Impression  Susan Jenkins was found to have cardiac wall thickness suggestive of HCM at age 58 in the absence of other cardiac loading conditions that can lead to cardiac hypertrophy. There is no family history of HCM or sudden death. It is likely she has a de novo mutation for HCM or has inherited this from one of her parents with evident reduced penetrance.  Patient is not interested in genetic testing as she does not have children.  In addition, patient should be aware of protections afforded by the Genetic Information Non-Discrimination Act (GINA). GINA protects a patient from losing their employment or health insurance based on their genotype. However,  these protections do not cover life insurance and disability. Explained to the patient that family members that are found to have the familial genetic mutation will be denied life insurance even if they are asymptomatic and do not exhibit clinical signs of HCM. She verbalized understanding and will discuss this with her children.   Please note that the patient has not been counseled in this visit on other personal, cultural or ethical issues that she may face due to her heart condition.   Plan Eh declines genetic testing as she does not have children. She will discuss HCM screening guidelines and procuring life insurance with her sisters.      Sidney Ace, Ph.D, [facility] Clinical Molecular Geneticist

## 2023-06-10 DIAGNOSIS — R001 Bradycardia, unspecified: Secondary | ICD-10-CM

## 2023-06-16 DIAGNOSIS — M9901 Segmental and somatic dysfunction of cervical region: Secondary | ICD-10-CM | POA: Diagnosis not present

## 2023-06-16 DIAGNOSIS — M62830 Muscle spasm of back: Secondary | ICD-10-CM | POA: Diagnosis not present

## 2023-06-16 DIAGNOSIS — M542 Cervicalgia: Secondary | ICD-10-CM | POA: Diagnosis not present

## 2023-06-16 DIAGNOSIS — M9903 Segmental and somatic dysfunction of lumbar region: Secondary | ICD-10-CM | POA: Diagnosis not present

## 2023-06-23 DIAGNOSIS — Z471 Aftercare following joint replacement surgery: Secondary | ICD-10-CM | POA: Diagnosis not present

## 2023-06-30 ENCOUNTER — Ambulatory Visit: Payer: PPO | Admitting: Cardiovascular Disease

## 2023-06-30 DIAGNOSIS — R001 Bradycardia, unspecified: Secondary | ICD-10-CM | POA: Diagnosis not present

## 2023-07-05 DIAGNOSIS — M9902 Segmental and somatic dysfunction of thoracic region: Secondary | ICD-10-CM | POA: Diagnosis not present

## 2023-07-05 DIAGNOSIS — M9901 Segmental and somatic dysfunction of cervical region: Secondary | ICD-10-CM | POA: Diagnosis not present

## 2023-07-05 DIAGNOSIS — M9903 Segmental and somatic dysfunction of lumbar region: Secondary | ICD-10-CM | POA: Diagnosis not present

## 2023-07-05 DIAGNOSIS — M542 Cervicalgia: Secondary | ICD-10-CM | POA: Diagnosis not present

## 2023-07-06 ENCOUNTER — Encounter: Payer: Self-pay | Admitting: Internal Medicine

## 2023-07-06 DIAGNOSIS — I421 Obstructive hypertrophic cardiomyopathy: Secondary | ICD-10-CM

## 2023-07-06 DIAGNOSIS — I422 Other hypertrophic cardiomyopathy: Secondary | ICD-10-CM

## 2023-07-06 DIAGNOSIS — I4729 Other ventricular tachycardia: Secondary | ICD-10-CM

## 2023-07-06 DIAGNOSIS — I253 Aneurysm of heart: Secondary | ICD-10-CM

## 2023-07-08 ENCOUNTER — Other Ambulatory Visit: Payer: Self-pay | Admitting: Family Medicine

## 2023-07-08 DIAGNOSIS — I10 Essential (primary) hypertension: Secondary | ICD-10-CM

## 2023-07-08 NOTE — Telephone Encounter (Signed)
 Called pt to schedule OV to address concerns.  [place] is at the [place] and can not talk right now.  Will call the office on Monday.

## 2023-07-21 DIAGNOSIS — Z96652 Presence of left artificial knee joint: Secondary | ICD-10-CM | POA: Diagnosis not present

## 2023-08-01 ENCOUNTER — Ambulatory Visit: Admitting: Internal Medicine

## 2023-08-02 DIAGNOSIS — M9902 Segmental and somatic dysfunction of thoracic region: Secondary | ICD-10-CM | POA: Diagnosis not present

## 2023-08-02 DIAGNOSIS — M9903 Segmental and somatic dysfunction of lumbar region: Secondary | ICD-10-CM | POA: Diagnosis not present

## 2023-08-02 DIAGNOSIS — M9901 Segmental and somatic dysfunction of cervical region: Secondary | ICD-10-CM | POA: Diagnosis not present

## 2023-08-02 DIAGNOSIS — M542 Cervicalgia: Secondary | ICD-10-CM | POA: Diagnosis not present

## 2023-08-20 ENCOUNTER — Other Ambulatory Visit: Payer: Self-pay | Admitting: Family Medicine

## 2023-08-20 DIAGNOSIS — F339 Major depressive disorder, recurrent, unspecified: Secondary | ICD-10-CM

## 2023-08-29 ENCOUNTER — Ambulatory Visit: Admitting: Internal Medicine

## 2023-08-30 ENCOUNTER — Ambulatory Visit: Attending: Cardiology | Admitting: Cardiology

## 2023-08-30 VITALS — BP 130/60 | HR 72 | Ht 66.5 in | Wt 231.0 lb

## 2023-08-30 DIAGNOSIS — M542 Cervicalgia: Secondary | ICD-10-CM | POA: Diagnosis not present

## 2023-08-30 DIAGNOSIS — I4729 Other ventricular tachycardia: Secondary | ICD-10-CM

## 2023-08-30 DIAGNOSIS — M9903 Segmental and somatic dysfunction of lumbar region: Secondary | ICD-10-CM | POA: Diagnosis not present

## 2023-08-30 DIAGNOSIS — M9902 Segmental and somatic dysfunction of thoracic region: Secondary | ICD-10-CM | POA: Diagnosis not present

## 2023-08-30 DIAGNOSIS — I422 Other hypertrophic cardiomyopathy: Secondary | ICD-10-CM | POA: Diagnosis not present

## 2023-08-30 DIAGNOSIS — M9901 Segmental and somatic dysfunction of cervical region: Secondary | ICD-10-CM | POA: Diagnosis not present

## 2023-08-30 DIAGNOSIS — R001 Bradycardia, unspecified: Secondary | ICD-10-CM | POA: Diagnosis not present

## 2023-08-30 NOTE — Progress Notes (Signed)
 Electrophysiology Office Note:   Date:  08/30/2023  ID:  Susan Jenkins, DOB [birth year], MRN [id number]  Primary Cardiologist: Janelle Mediate, MD Electrophysiologist: Ardeen Kohler, MD      History of Present Illness:   Susan Jenkins is a 78 y.o. 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 Paulita Boss.  Discussed the use of AI scribe software for clinical note transcription with the patient, who gave verbal consent to proceed.  History of Present Illness She has apical hypertrophic cardiomyopathy, confirmed by MRI and ultrasound. Extensive cardiac testing, including a Zio monitor, recorded 32 episodes of non-sustained ventricular tachycardia over two weeks. No symptoms of heart racing, fluttering, or skipping beats, and no episodes of syncope. She is unaware of the episodes occurring, even during activities such as mowing the yard or sleeping, although she does report some exertional fatigue. She has a history of knee surgery, which led her to reduce her physical activity, although she continues to participate in water  aerobics twice a week. She has no children and limited family history knowledge, but her father had heart problems. No known family history of sudden cardiac death or life-threatening heart rhythms.  Review of systems complete and found to be negative unless listed in HPI.   EP Information / Studies Reviewed:    EKG is not ordered today. EKG from 02/15/24 reviewed which showed sinus bradycardia with LVH.      Cardiac MRI 03/2023:  IMPRESSION: 1. 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  LVEF 61% RVEF 58% 3.  Mid cavitary obstruction from hypertrophied papillary muscles 4. Mildy elevated T1 and ECV consistent with fibrosis not necessarily infiltration 5. Gaolinium enhancement full thickness in true apex as  well as sub epicardial in basal septum and mid inferior wall 6.  Moderate LAE 7.  Triival appearing MR 8.  Decreased cardiac output 2.3 L/min    Echo 02/17/20:   1. Left ventricular ejection fraction, by estimation, is 60 to 65%. The  left ventricle has normal function. The left ventricle demonstrates  regional wall motion abnormalities with a small dyskinetic pouch at the  true apex. There is asymmetric  moderate-severe apical hypertrophy with the small apical dyskinetic pouch  seen best in the 4-chamber view. There is a mid-cavity LV gradient, peak  around 30 mmHg. Left ventricular diastolic parameters are consistent with  Grade I diastolic dysfunction  (impaired relaxation). This picture is most consistent with apical variant  hypertrophic cardiomyopathy. Consider cardiac MRI for delayed enhancement  evaluation/risk stratification.   2. Right ventricular systolic function is mildly reduced. The right  ventricular size is normal. Tricuspid regurgitation signal is inadequate  for assessing PA pressure.   3. Left atrial size was moderately dilated.   4. The mitral valve is normal in structure. Trivial mitral valve  regurgitation. No evidence of mitral stenosis.   5. The aortic valve is tricuspid. There is mild calcification of the  aortic valve. Aortic valve regurgitation is mild. Aortic valve  sclerosis/calcification is present, without any evidence of aortic  stenosis.   6. The inferior vena cava is normal in size with greater than 50%  respiratory variability, suggesting right atrial pressure of 3 mmHg.   Physical Exam:   VS:  BP 130/60 (BP Location: Right Arm, Patient Position: Sitting, Cuff Size: Large)   Pulse 72   Ht 5' 6.5" (1.689 m)   [employer]  231 lb (104.8 kg)   SpO2 96%   BMI 36.73 kg/m    Wt Readings from Last 3 Encounters:  08/30/23 231 lb (104.8 kg)  06/06/23 231 lb (104.8 kg)  02/28/23 238 lb (108 kg)     GEN: Well nourished, well developed in no acute  distress NECK: No JVD CARDIAC: Normal rate, regular RESPIRATORY:  Clear to auscultation without rales, wheezing or rhonchi  ABDOMEN: Soft, non-distended EXTREMITIES:  No edema; No deformity   ASSESSMENT AND PLAN:    #. Apical HCM: Based on the AHA risk calculator, she has a 4.12% risk of SCD with 5 years, class 2a indication for ICD.  We discussed that this may be underestimated due to presence of apical aneurysm.  She is very functional, lives alone.  She has no other major comorbidities that would significantly impact her life expectancy. #. NSVT: She had 32 NSVT events over a 2 week period. Not currently on medications due to resting sinus bradycardia. - Had a long discussion with patient and her sister regarding her estimated risk of sudden cardiac death, management of NSVT and ICD therapy.  Unfortunately, her resting bradycardia and some concerns for exertional fatigue, limit the ability to medically treat her NSVT with beta-blockers. I told her that based on risk estimates and her functional status, living alone, that an ICD implant would be reasonable, and would choose dual-chamber ICD for atrial pacing so that patient could initiate beta-blocker therapy.  I also told her that at an age of 27, declining ICD therapy, if in accordance with her wishes, would also be very reasonable. Explained risks, benefits, and alternatives to ICD implantation, including but not limited to bleeding, infection, damage to heart or lungs, heart attack, stroke, or death.  Ultimately, patient and her family would like more time to think about their options.  #. Sinus bradycardia: Appears to be asymptomatic at rest.  She does endorse some exertional fatigue.  On her Zio monitor it appears she was able to increase her heart rate appropriately. - If patient wants to pursue ICD implant, then would implant atrial lead as well.   Total time of encounter: 67 minutes total time of encounter, including chart review,  face-to-face patient care, coordination of care and counseling regarding high complexity medical decision making.  Signed, Ardeen Kohler, MD

## 2023-08-30 NOTE — Patient Instructions (Signed)
 Medication Instructions:  Your physician recommends that you continue on your current medications as directed. Please refer to the Current Medication list given to you today.  *If you need a refill on your cardiac medications before your next appointment, please call your pharmacy*  Testing/Procedures: Your physician has recommended that you have a defibrillator inserted. An implantable cardioverter defibrillator (ICD) is a small device that is placed in your chest or, in rare cases, your abdomen. This device uses electrical pulses or shocks to help control life-threatening, irregular heartbeats that could lead the heart to suddenly stop beating (sudden cardiac arrest). Leads are attached to the ICD that goes into your heart. This is done in the hospital and usually requires an overnight stay.   Follow-Up: At [facility], you and your health needs are our priority.  As part of our continuing mission to provide you with exceptional heart care, our providers are all part of one team.  This team includes your primary Cardiologist (physician) and Advanced Practice Providers or APPs (Physician Assistants and Nurse Practitioners) who all work together to provide you with the care you need, when you need it.  Your next appointment:   Call us  if you decide to schedule an ICD implant

## 2023-09-01 ENCOUNTER — Telehealth: Payer: Self-pay | Admitting: Cardiology

## 2023-09-01 ENCOUNTER — Other Ambulatory Visit: Payer: Self-pay

## 2023-09-01 DIAGNOSIS — I422 Other hypertrophic cardiomyopathy: Secondary | ICD-10-CM

## 2023-09-01 NOTE — Telephone Encounter (Signed)
 Pt is calling because she has decided to get the ICD Implant discussed with Dr. Daneil Dunker

## 2023-09-01 NOTE — Telephone Encounter (Signed)
 Spoke with the patient and scheduled her for an ICD implant with Dr. Daneil Dunker.

## 2023-09-05 DIAGNOSIS — Z1231 Encounter for screening mammogram for malignant neoplasm of breast: Secondary | ICD-10-CM | POA: Diagnosis not present

## 2023-09-05 LAB — HM MAMMOGRAPHY

## 2023-09-07 ENCOUNTER — Telehealth (HOSPITAL_COMMUNITY): Payer: Self-pay

## 2023-09-07 ENCOUNTER — Encounter (HOSPITAL_COMMUNITY): Payer: Self-pay

## 2023-09-07 NOTE — Telephone Encounter (Signed)
 Spoke with patient to complete pre-procedure call.     New medical conditions?  No Recent hospitalizations or surgeries? No Started any new medications? OTC Moundrops for weight loss. Will verify with anesthesia team, if medication needs to be held prior to procedure.  Patient made aware to contact office to inform of any new medications started. Any changes in activities of daily living? No  Pre-procedure testing scheduled: lab work 09/15/23  Confirmed patient is scheduled for  Implantable cardioverter defibrilator (ICD) on Monday, June 2 with Dr. Clinton Danas. Instructed patient to arrive at the Main Entrance A at [facility]: [address] [place], [place] [address] and check in at Admitting at 12:30 PM  Advised of plan to go home the same day and will only stay overnight if medically necessary. You MUST have a responsible adult to drive you home and MUST be with you the first 24 hours after you arrive home or your procedure could be cancelled.  Patient verbalized understanding to information provided and is agreeable to proceed with procedure.

## 2023-09-07 NOTE — Telephone Encounter (Signed)
 Response regarding Moundrops per Allison Zelenak, PA-C with short stay surgical, "it looks like a combination of several herbal/amino acid compounds.  I would treat it like other herbal medications which we ask them to hold for at least a week before surgery."  Called patient to advise to hold Moundrops 7 days prior to procedure. She requested this information be sent to her MyChart since she was currently driving.

## 2023-09-10 ENCOUNTER — Other Ambulatory Visit: Payer: Self-pay | Admitting: Family Medicine

## 2023-09-10 DIAGNOSIS — I10 Essential (primary) hypertension: Secondary | ICD-10-CM

## 2023-09-12 DIAGNOSIS — I422 Other hypertrophic cardiomyopathy: Secondary | ICD-10-CM | POA: Diagnosis not present

## 2023-09-13 LAB — CBC WITH DIFFERENTIAL/PLATELET
Basophils Absolute: 0.1 10*3/uL (ref 0.0–0.2)
Basos: 1 %
EOS (ABSOLUTE): 0.1 10*3/uL (ref 0.0–0.4)
Eos: 2 %
Hematocrit: 39 % (ref 34.0–46.6)
Hemoglobin: 13.5 g/dL (ref 11.1–15.9)
Immature Grans (Abs): 0 10*3/uL (ref 0.0–0.1)
Immature Granulocytes: 0 %
Lymphocytes Absolute: 1.2 10*3/uL (ref 0.7–3.1)
Lymphs: 20 %
MCH: 35 pg — ABNORMAL HIGH (ref 26.6–33.0)
MCHC: 34.6 g/dL (ref 31.5–35.7)
MCV: 101 fL — ABNORMAL HIGH (ref 79–97)
Monocytes Absolute: 0.5 10*3/uL (ref 0.1–0.9)
Monocytes: 8 %
Neutrophils Absolute: 4.2 10*3/uL (ref 1.4–7.0)
Neutrophils: 69 %
Platelets: 275 10*3/uL (ref 150–450)
RBC: 3.86 x10E6/uL (ref 3.77–5.28)
RDW: 12.2 % (ref 11.7–15.4)
WBC: 6 10*3/uL (ref 3.4–10.8)

## 2023-09-13 LAB — BASIC METABOLIC PANEL WITH GFR
BUN/Creatinine Ratio: 13 (ref 12–28)
BUN: 10 mg/dL (ref 8–27)
CO2: 26 mmol/L (ref 20–29)
Calcium: 10.1 mg/dL (ref 8.7–10.3)
Chloride: 95 mmol/L — ABNORMAL LOW (ref 96–106)
Creatinine, Ser: 0.78 mg/dL (ref 0.57–1.00)
Glucose: 173 mg/dL — ABNORMAL HIGH (ref 70–99)
Potassium: 3.9 mmol/L (ref 3.5–5.2)
Sodium: 136 mmol/L (ref 134–144)
eGFR: 79 mL/min/{1.73_m2} (ref >59)

## 2023-09-14 ENCOUNTER — Ambulatory Visit: Payer: Self-pay | Admitting: Cardiology

## 2023-09-15 ENCOUNTER — Ambulatory Visit: Admitting: Family Medicine

## 2023-09-15 ENCOUNTER — Encounter: Payer: Self-pay | Admitting: Family Medicine

## 2023-09-15 VITALS — BP 120/82 | HR 52 | Temp 98.1°F | Ht 66.5 in | Wt 229.4 lb

## 2023-09-15 DIAGNOSIS — I10 Essential (primary) hypertension: Secondary | ICD-10-CM | POA: Diagnosis not present

## 2023-09-15 DIAGNOSIS — I422 Other hypertrophic cardiomyopathy: Secondary | ICD-10-CM | POA: Diagnosis not present

## 2023-09-15 DIAGNOSIS — Z0000 Encounter for general adult medical examination without abnormal findings: Secondary | ICD-10-CM | POA: Diagnosis not present

## 2023-09-15 DIAGNOSIS — E785 Hyperlipidemia, unspecified: Secondary | ICD-10-CM

## 2023-09-15 DIAGNOSIS — F339 Major depressive disorder, recurrent, unspecified: Secondary | ICD-10-CM | POA: Diagnosis not present

## 2023-09-15 DIAGNOSIS — R718 Other abnormality of red blood cells: Secondary | ICD-10-CM | POA: Diagnosis not present

## 2023-09-15 DIAGNOSIS — H9191 Unspecified hearing loss, right ear: Secondary | ICD-10-CM | POA: Diagnosis not present

## 2023-09-15 MED ORDER — VENLAFAXINE HCL ER 75 MG PO CP24: 75.0000 mg | ORAL_CAPSULE | Freq: Every day | ORAL | 3 refills | Status: AC

## 2023-09-15 MED ORDER — HYDROCHLOROTHIAZIDE 25 MG PO TABS: 25.0000 mg | ORAL_TABLET | Freq: Every day | ORAL | 3 refills | Status: AC

## 2023-09-15 MED ORDER — AMLODIPINE BESYLATE 10 MG PO TABS: 10.0000 mg | ORAL_TABLET | Freq: Every day | ORAL | 3 refills | Status: AC

## 2023-09-15 MED ORDER — ROSUVASTATIN CALCIUM 10 MG PO TABS: 10.0000 mg | ORAL_TABLET | Freq: Every day | ORAL | 3 refills | Status: AC

## 2023-09-15 NOTE — Progress Notes (Signed)
 Annual Wellness Visit     Patient: Susan Jenkins, Female    DOB: 04-10-1946, 78 y.o.   MRN: [id number]  Subjective  Chief Complaint  Patient presents with   Medical Management of Chronic Issues    Medication refill     Medicare Wellness    Susan Jenkins is a 78 y.o. female who presents today for her Annual Wellness Visit. She reports consuming a general and low sodium diet. Stays active. She generally feels well. She reports sleeping fairly well. She does not have additional problems to discuss today.  Patient states she is doing well overall/feels good.  Since last OFV patient had left TKR.  States knee is doing better but not quite where it should be.  Notes increased allergy  symptoms due to pollen.  Mowed grass this week without a mask.  Followed by cardiology, Dr. Daneil Dunker for history of HOCM.  States unable to tell if heart is skipping beats.  Has ICD placement scheduled 10/03/2023.  Patient inquires about colonoscopy as she was advised that she did not need another after the last one on 10/01/2021.  Had mammogram 09/05/2023.  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) 06/06/2023   Osteoarthritis of left knee 02/28/2023   OA (osteoarthritis) of knee 07/18/2017   Allergic contact dermatitis due to metals 06/30/2017   Acute colitis 09/29/2015   Hyponatremia 09/29/2015   Family history of cancer of GI tract 08/29/2013   Hypertrophic cardiomyopathy (HCC) 06/11/2010   OTHER SPECIFIED DISORDERS OF LIVER 05/07/2010   UNSPECIFIED VITAMIN D DEFICIENCY 03/17/2010   Hyperlipemia 03/17/2010   DEPRESSION 03/17/2010   GLUCOMA 03/17/2010   DIPLOPIA 03/17/2010   Essential hypertension 03/17/2010   MITRAL VALVE PROLAPSE 03/17/2010   UNSPEC HEMORRHOIDS WITHOUT  MENTION COMPLICATION 03/17/2010   BLOOD IN STOOL 03/17/2010   JOINT PAIN, HAND 03/17/2010   SCIATICA 03/17/2010   BACK PAIN 03/17/2010   Past Medical History:  Diagnosis Date   Complication of anesthesia    Depression    Glaucoma    Heart murmur    Hemorrhoids    History of diverticulitis 09/2015   HOCM (hypertrophic obstructive cardiomyopathy) (HCC)    Hyperlipidemia    Hypertension    PONV (postoperative nausea and vomiting)    Pulmonary embolus (HCC) 1971   following surgery   Vitamin D deficiency    Past Surgical History:  Procedure Laterality Date   ABDOMINAL AORTIC ANEURYSM REPAIR     ANKLE SURGERY     BACK SURGERY     CARDIAC CATHETERIZATION     DG THUMB LEFT HAND     tendon   NASAL SEPTUM SURGERY     TOTAL KNEE ARTHROPLASTY Right 07/18/2017   Procedure: RIGHT TOTAL KNEE ARTHROPLASTY;  Surgeon: Liliane Rei, MD;  Location: [facility];  Service: Orthopedics;  Laterality: Right;   TOTAL KNEE ARTHROPLASTY Left 02/28/2023   Procedure: TOTAL KNEE ARTHROPLASTY;  Surgeon: Liliane Rei, MD;  Location: [facility];  Service: Orthopedics;  Laterality: Left;   Social History   Tobacco Use   Smoking status: Former    Current packs/day: 0.00    Types: Cigarettes    Quit date: 05/03/1980    Years since quitting: 43.3   Smokeless tobacco: Never   Tobacco comments:    a .50  a pack;   Vaping Use   Vaping status: Never Used  Substance Use Topics   Alcohol use: Yes    Comment: rarely   Drug use: No   Family History  Problem Relation Age of Onset   Dementia Mother    Colon cancer Father 56   Heart failure Father    Heart attack Father    Esophageal cancer Maternal Grandmother    Allergic rhinitis Neg Hx    Asthma Neg Hx    Eczema Neg Hx    Urticaria Neg Hx    Angioedema Neg Hx    Rectal cancer Neg Hx    Stomach cancer Neg Hx    Allergies  Allergen Reactions   Amoxicillin-Pot Clavulanate Anaphylaxis    Has patient had a PCN reaction causing immediate rash,  facial/tongue/throat swelling, SOB or lightheadedness with hypotension: Yes Has patient had a PCN reaction causing severe rash involving mucus membranes or skin necrosis: Yes Has patient had a PCN reaction that required hospitalization Yes Has patient had a PCN reaction occurring within the last 10 years: No If all of the above answers are "NO", then may proceed with Cephalosporin use.   Augmentin [Amoxicillin-Pot Clavulanate] Anaphylaxis   Nickel Rash      Medications: Outpatient Medications Prior to Visit  Medication Sig   acetaminophen  (TYLENOL ) 650 MG CR tablet Take 650 mg by mouth in the morning and at bedtime.   AMBULATORY NON FORMULARY MEDICATION 2% Diltiazem  ointment Apply to the anal sphincter 5 times daily   Calcium  Carbonate-Vitamin D (CALCIUM  600-VITAMIN D3 PO) Take 1 tablet by mouth daily.   diltiazem  2 % GEL Apply 1 application. topically 2 (two) times daily.   latanoprost  (XALATAN ) 0.005 % ophthalmic solution Place 1 drop into both eyes at bedtime.   loratadine  (CLARITIN ) 10 MG tablet Take 10 mg by mouth daily.   meclizine  (ANTIVERT ) 25 MG tablet Take 1 tablet (25 mg total) by mouth 3 (three) times daily as needed for dizziness.   MELATONIN PO Take 3 mg by mouth at bedtime.   Metamucil Fiber CHEW Chew 2-3 each by mouth daily.   Misc Natural Products (GLUCOSAMINE CHONDROITIN ADV PO) Take 1 tablet by mouth 2 (two) times daily.   Multiple Vitamins-Minerals (MULTIVITAMIN ADULT PO) Take 1 tablet by mouth daily.   Omega-3 Fatty Acids (FISH OIL PO) Take 1 capsule by mouth daily.   ondansetron  (ZOFRAN ) 4 MG tablet Take 1 tablet (4 mg total) by mouth every 6 (six) hours as needed for nausea.   OVER THE COUNTER MEDICATION Moundrops- applies 2 drops under the tongue daily   Probiotic Product (PROBIOTIC PO) Take 1 capsule by mouth at bedtime.   pyridOXINE  (VITAMIN B-6) 50 MG tablet Take 50 mg by mouth daily.   timolol  (BETIMOL ) 0.5 % ophthalmic solution 1 [drp] by ophthalmic route.    timolol  (TIMOPTIC ) 0.5 % ophthalmic solution Place 1 drop into both eyes 2 (two) times daily.    [DISCONTINUED] amLODipine  (NORVASC ) 10 MG tablet TAKE 1 TABLET BY MOUTH EVERY DAY   [DISCONTINUED] hydrochlorothiazide  (HYDRODIURIL ) 25 MG tablet TAKE 1 TABLET (25 MG TOTAL) BY MOUTH DAILY.   [DISCONTINUED] rosuvastatin  (CRESTOR ) 10 MG tablet TAKE 1 TABLET BY MOUTH EVERY DAY   [DISCONTINUED] venlafaxine  XR (EFFEXOR -XR) 75 MG 24 hr capsule TAKE 1 CAPSULE BY MOUTH EVERY DAY   No facility-administered medications prior to visit.    Allergies  Allergen Reactions   Amoxicillin-Pot Clavulanate Anaphylaxis    Has patient had a PCN reaction  causing immediate rash, facial/tongue/throat swelling, SOB or lightheadedness with hypotension: Yes Has patient had a PCN reaction causing severe rash involving mucus membranes or skin necrosis: Yes Has patient had a PCN reaction that required hospitalization Yes Has patient had a PCN reaction occurring within the last 10 years: No If all of the above answers are "NO", then may proceed with Cephalosporin use.   Augmentin [Amoxicillin-Pot Clavulanate] Anaphylaxis   Nickel Rash    Patient Care Team: Viola Greulich, MD as PCP - General (Family Medicine) Loyde Rule, MD as PCP - Cardiology (Cardiology) Ardeen Kohler, MD as PCP - Electrophysiology (Cardiology) Liliane Rei, MD as Consulting Physician (Orthopedic Surgery) Dema Filler, MD as Consulting Physician (Ophthalmology)  ROS    Objective  BP 120/82 (BP Location: Left Arm, Patient Position: Sitting, Cuff Size: Normal)   Pulse (!) 52   Temp 98.1 F (36.7 C) (Oral)   Ht 5' 6.5" (1.689 m)   Wt 229 lb 6.4 oz (104.1 kg)   SpO2 97%   BMI 36.47 kg/m  BP Readings from Last 3 Encounters:  09/15/23 120/82  08/30/23 130/60  06/06/23 132/60   Wt Readings from Last 3 Encounters:  09/15/23 229 lb 6.4 oz (104.1 kg)  08/30/23 231 lb (104.8 kg)  06/06/23 231 lb (104.8 kg)      Physical  Exam Constitutional:      Appearance: Normal appearance.  HENT:     Head: Normocephalic and atraumatic.     Right Ear: Tympanic membrane, ear canal and external ear normal.     Left Ear: Tympanic membrane, ear canal and external ear normal.     Nose: Nose normal.     Mouth/Throat:     Mouth: Mucous membranes are moist.     Pharynx: No oropharyngeal exudate or posterior oropharyngeal erythema.  Eyes:     General: No scleral icterus.    Extraocular Movements: Extraocular movements intact.     Conjunctiva/sclera: Conjunctivae normal.     Pupils: Pupils are equal, round, and reactive to light.  Neck:     Thyroid : No thyromegaly.  Cardiovascular:     Rate and Rhythm: Normal rate and regular rhythm.     Pulses: Normal pulses.     Heart sounds: Normal heart sounds. No murmur heard.    No friction rub.  Pulmonary:     Effort: Pulmonary effort is normal.     Breath sounds: Normal breath sounds. No wheezing, rhonchi or rales.  Abdominal:     General: Bowel sounds are normal.     Palpations: Abdomen is soft.     Tenderness: There is no abdominal tenderness.  Musculoskeletal:        General: No deformity. Normal range of motion.  Lymphadenopathy:     Cervical: No cervical adenopathy.  Skin:    General: Skin is warm and dry.     Findings: No lesion.  Neurological:     General: No focal deficit present.     Mental Status: She is alert and oriented to person, place, and time.  Psychiatric:        Mood and Affect: Mood normal.        Thought Content: Thought content normal.     Most recent functional status assessment:    09/15/2023   11:27 AM  In your present state of health, do you have any difficulty performing the following activities:  Hearing? 1  Vision? 1  Comment wears glasses  Dressing or bathing? 0  Doing errands, shopping?  0  Preparing Food and eating ? N  Using the Toilet? N  In the past six months, have you accidently leaked urine? Y  Do you have problems with  loss of bowel control? N  Managing your Medications? N  Managing your Finances? N  Housekeeping or managing your Housekeeping? N   Most recent fall risk assessment:    09/15/2023   11:33 AM  Fall Risk   Falls in the past year? 0  Number falls in past yr: 0  Injury with Fall? 0  Risk for fall due to : No Fall Risks  Follow up Falls evaluation completed    Most recent depression screenings:    09/15/2023   11:49 AM 01/13/2023    8:17 AM  PHQ 2/9 Scores  PHQ - 2 Score 0 0  PHQ- 9 Score 2 4   Most recent cognitive screening:    09/15/2023   11:35 AM  6CIT Screen  What Year? 0 points  What month? 0 points  What time? 0 points  Count back from 20 0 points  Months in reverse 0 points  Repeat phrase 0 points  Total Score 0 points   Most recent Audit-C alcohol use screening    09/15/2023   11:33 AM  Alcohol Use Disorder Test (AUDIT)  1. How often do you have a drink containing alcohol? 1  2. How many drinks containing alcohol do you have on a typical day when you are drinking? 0  3. How often do you have six or more drinks on one occasion? 0  AUDIT-C Score 1   A score of 3 or more in women, and 4 or more in men indicates increased risk for alcohol abuse, EXCEPT if all of the points are from question 1   Vision/Hearing Screen: Hearing Screening   250Hz  500Hz  1000Hz  2000Hz  3000Hz   Right ear Fail Fail Fail Pass Fail  Left ear Pass Pass Pass Pass Fail      No results found for any visits on 09/15/23.    Assessment & Plan   Annual wellness visit done today including the all of the following: Reviewed patient's Family Medical History Reviewed and updated list of patient's medical providers Assessment of cognitive impairment was done Assessed patient's functional ability Established a written schedule for health screening services Health Risk Assessent Completed and Reviewed  Exercise Activities and Dietary recommendations  Goals       Patient Stated (pt-stated)       I Will continue to play sports and remain socially active.        Immunization History  Administered Date(s) Administered   Fluad Quad(high Dose 65+) 01/13/2022   Fluad Trivalent(High Dose 65+) 01/13/2023   Hepatitis A 06/03/2010, 04/14/2011   Hepatitis B 06/03/2010, 04/14/2011, 09/15/2011   Influenza Split 03/18/2015   Influenza Whole 02/20/2013   Influenza, High Dose Seasonal PF 02/16/2018, 02/16/2019   Influenza-Unspecified 02/14/2014, 01/29/2016, 03/05/2017, 01/30/2021   Moderna Covid-19 Fall Seasonal Vaccine 77yrs & older 01/25/2022, 02/01/2023, 02/01/2023   Moderna Covid-19 Vaccine Bivalent Booster 10yrs & up 01/30/2021   Moderna Sars-Covid-2 Vaccination 06/01/2019, 07/02/2019, 03/06/2020   Pneumococcal Conjugate-13 03/06/2014   Pneumococcal Polysaccharide-23 04/01/2015   Respiratory Syncytial Virus Vaccine,Recomb Aduvanted(Arexvy) 01/13/2022   Tdap 06/03/2010, 07/04/2017   Zoster Recombinant(Shingrix) 02/17/2018, 06/21/2018   Zoster, Live 01/01/2009    Health Maintenance  Topic Date Due   Hepatitis C Screening  09/20/2023 (Originally 03/09/1964)   COVID-19 Vaccine (7 - Moderna risk 2024-25 season) 10/01/2023 (Originally 08/02/2023)  INFLUENZA VACCINE  12/02/2023   MAMMOGRAM  09/04/2024   Medicare Annual Wellness (AWV)  09/14/2024   DTaP/Tdap/Td (3 - Td or Tdap) 07/05/2027   Pneumonia Vaccine 64+ Years old  Completed   DEXA SCAN  Completed   Zoster Vaccines- Shingrix  Completed   HPV VACCINES  Aged Out   Meningococcal B Vaccine  Aged Out   Colonoscopy  Discontinued     Discussed health benefits of physical activity, and encouraged her to engage in regular exercise appropriate for her age and condition.    Problem List Items Addressed This Visit       Cardiovascular and Mediastinum   Essential hypertension   Relevant Medications   amLODipine  (NORVASC ) 10 MG tablet   rosuvastatin  (CRESTOR ) 10 MG tablet   hydrochlorothiazide  (HYDRODIURIL ) 25 MG tablet    Other Relevant Orders   CMP   TSH   Hypertrophic cardiomyopathy (HCC)   Relevant Medications   amLODipine  (NORVASC ) 10 MG tablet   rosuvastatin  (CRESTOR ) 10 MG tablet   hydrochlorothiazide  (HYDRODIURIL ) 25 MG tablet   Other Relevant Orders   CMP     Other   Hyperlipemia   Relevant Medications   amLODipine  (NORVASC ) 10 MG tablet   rosuvastatin  (CRESTOR ) 10 MG tablet   hydrochlorothiazide  (HYDRODIURIL ) 25 MG tablet   Other Relevant Orders   Lipid panel   CMP   Other Visit Diagnoses       Encounter for Medicare annual wellness exam    -  Primary     Depression, recurrent (HCC)       Relevant Medications   venlafaxine  XR (EFFEXOR -XR) 75 MG 24 hr capsule     Decreased hearing of right ear         Elevated MCV       Relevant Orders   Vitamin B12   CBC with Differential/Platelet     Immunizations up-to-date.  BP well-controlled.  Continue current medications including Norvasc  10 mg daily, HCTZ 25 mg daily.  Continue lifestyle modification.  Continue Crestor  for HLD. lipid panel ordered.  Patient wishes to to wait on labs at this time.  History of depression.  Stable.  PHQ 9 score 2.  GAD 7 score 1 this visit.  Continue Effexor  XR 75 mg daily.  Continue follow-up with cardiology for HOCM.  ICD placement scheduled 10/03/2023.  Questions asked regarding procedure.  Decreased hearing right ear.  Hearing test completed.  Discussed audiology referral.  Patient wishes to wait at this time until ICD placement.  Patient to notify clinic when she wishes to proceed with referral.  Elevated MCV noted on recent labs.  Obtain B12 level.   Return in about 6 months (around 03/17/2024), or if symptoms worsen or fail to improve, for chronic conditions.     Viola Greulich, MD

## 2023-09-19 DIAGNOSIS — Z96652 Presence of left artificial knee joint: Secondary | ICD-10-CM | POA: Diagnosis not present

## 2023-09-27 DIAGNOSIS — M9902 Segmental and somatic dysfunction of thoracic region: Secondary | ICD-10-CM | POA: Diagnosis not present

## 2023-09-27 DIAGNOSIS — M542 Cervicalgia: Secondary | ICD-10-CM | POA: Diagnosis not present

## 2023-09-27 DIAGNOSIS — M9901 Segmental and somatic dysfunction of cervical region: Secondary | ICD-10-CM | POA: Diagnosis not present

## 2023-09-27 DIAGNOSIS — M9903 Segmental and somatic dysfunction of lumbar region: Secondary | ICD-10-CM | POA: Diagnosis not present

## 2023-09-28 ENCOUNTER — Telehealth (HOSPITAL_COMMUNITY): Payer: Self-pay

## 2023-09-28 NOTE — Telephone Encounter (Signed)
 Spoke with patient to discuss upcoming procedure.   Confirmed patient is scheduled for a Implantable cardioverter defibrilator (ICD) on Monday, June 2 with Dr. Clinton Danas. Instructed patient to arrive at the Main Entrance A at [facility]: [address] [place], [place] [address] and check in at Admitting at 12:30 PM.   Labs completed  Any recent signs of acute illness or been started on antibiotics? No Any new medications started? No Any medications to hold? hold Moundrops 7 days prior to procedure- last dose 09/25/23. Medication instructions:  On the morning of your procedure you may take all other morning medications with a small sip of water .  No eating or drinking after midnight prior to procedure.   The night before your procedure and the morning of your procedure, wash thoroughly with the CHG surgical soap from the neck down, paying special attention to the area where your procedure will be performed.  Advised of plan to go home the same day and will only stay overnight if medically necessary. You MUST have a responsible adult to drive you home and MUST be with you the first 24 hours after you arrive home.  Patient verbalized understanding to all instructions provided and agreed to proceed with procedure.

## 2023-09-30 NOTE — Pre-Procedure Instructions (Signed)
 Instructed patient on the following items: Arrival time 1230 Nothing to eat or drink after midnight No meds AM of procedure Responsible person to drive you home and stay with you for 24 hrs Wash with special soap night before and morning of procedure

## 2023-10-03 ENCOUNTER — Encounter (HOSPITAL_COMMUNITY): Payer: Self-pay | Admitting: Cardiology

## 2023-10-03 ENCOUNTER — Ambulatory Visit (HOSPITAL_COMMUNITY)
Admission: RE | Admit: 2023-10-03 | Discharge: 2023-10-04 | Disposition: A | Discharge status: Home / Self Care | Attending: Cardiology | Admitting: Cardiology

## 2023-10-03 ENCOUNTER — Other Ambulatory Visit: Payer: Self-pay

## 2023-10-03 ENCOUNTER — Ambulatory Visit (HOSPITAL_COMMUNITY)
Admission: RE | Disposition: A | Discharge status: Home / Self Care | Payer: Self-pay | Source: Home / Self Care | Attending: Cardiology

## 2023-10-03 DIAGNOSIS — I421 Obstructive hypertrophic cardiomyopathy: Secondary | ICD-10-CM

## 2023-10-03 DIAGNOSIS — Z79899 Other long term (current) drug therapy: Secondary | ICD-10-CM | POA: Insufficient documentation

## 2023-10-03 DIAGNOSIS — R001 Bradycardia, unspecified: Secondary | ICD-10-CM | POA: Diagnosis not present

## 2023-10-03 DIAGNOSIS — I253 Aneurysm of heart: Secondary | ICD-10-CM | POA: Diagnosis not present

## 2023-10-03 DIAGNOSIS — I10 Essential (primary) hypertension: Secondary | ICD-10-CM | POA: Insufficient documentation

## 2023-10-03 DIAGNOSIS — E785 Hyperlipidemia, unspecified: Secondary | ICD-10-CM | POA: Insufficient documentation

## 2023-10-03 DIAGNOSIS — I472 Ventricular tachycardia, unspecified: Secondary | ICD-10-CM | POA: Diagnosis not present

## 2023-10-03 DIAGNOSIS — I422 Other hypertrophic cardiomyopathy: Secondary | ICD-10-CM | POA: Diagnosis not present

## 2023-10-03 HISTORY — PX: ICD IMPLANT: EP1208

## 2023-10-03 MED ORDER — VANCOMYCIN HCL IN DEXTROSE 1-5 GM/200ML-% IV SOLN
INTRAVENOUS | Status: AC
  Administered 2023-10-03: 1000 mg via INTRAVENOUS
  Filled 2023-10-03: qty 200

## 2023-10-03 MED ORDER — MIDAZOLAM HCL 5 MG/5ML IJ SOLN
INTRAMUSCULAR | Status: DC | PRN
  Administered 2023-10-03 (×4): 1 mg via INTRAVENOUS

## 2023-10-03 MED ORDER — TIMOLOL MALEATE 0.5 % OP SOLN
1.0000 [drp] | Freq: Two times a day (BID) | OPHTHALMIC | Status: DC
  Administered 2023-10-03 – 2023-10-04 (×2): 1 [drp] via OPHTHALMIC
  Filled 2023-10-03: qty 5

## 2023-10-03 MED ORDER — FENTANYL CITRATE (PF) 100 MCG/2ML IJ SOLN
INTRAMUSCULAR | Status: AC
  Filled 2023-10-03: qty 2

## 2023-10-03 MED ORDER — LIDOCAINE HCL (PF) 1 % IJ SOLN
INTRAMUSCULAR | Status: DC | PRN
  Administered 2023-10-03: 30 mL

## 2023-10-03 MED ORDER — VANCOMYCIN HCL IN DEXTROSE 1-5 GM/200ML-% IV SOLN
INTRAVENOUS | Status: AC
Start: 2023-10-03 — End: 2023-10-04
  Filled 2023-10-03: qty 200

## 2023-10-03 MED ORDER — MECLIZINE HCL 25 MG PO TABS: 25.0000 mg | ORAL_TABLET | Freq: Three times a day (TID) | ORAL | Status: DC | PRN

## 2023-10-03 MED ORDER — ROSUVASTATIN CALCIUM 5 MG PO TABS
10.0000 mg | ORAL_TABLET | Freq: Every day | ORAL | Status: DC
  Administered 2023-10-04: 10 mg via ORAL
  Filled 2023-10-03: qty 2

## 2023-10-03 MED ORDER — MIDAZOLAM HCL 2 MG/2ML IJ SOLN
INTRAMUSCULAR | Status: AC
  Filled 2023-10-03: qty 2

## 2023-10-03 MED ORDER — LATANOPROST 0.005 % OP SOLN
1.0000 [drp] | Freq: Every day | OPHTHALMIC | Status: DC
  Administered 2023-10-03: 1 [drp] via OPHTHALMIC
  Filled 2023-10-03: qty 2.5

## 2023-10-03 MED ORDER — ONDANSETRON HCL 4 MG/2ML IJ SOLN: 4.0000 mg | Freq: Four times a day (QID) | INTRAMUSCULAR | Status: DC | PRN

## 2023-10-03 MED ORDER — LIDOCAINE HCL (PF) 1 % IJ SOLN
INTRAMUSCULAR | Status: AC
Start: 2023-10-03 — End: ?
  Filled 2023-10-03: qty 60

## 2023-10-03 MED ORDER — SODIUM CHLORIDE 0.9 % IV SOLN: INTRAVENOUS | Status: DC

## 2023-10-03 MED ORDER — ACETAMINOPHEN 325 MG PO TABS
325.0000 mg | ORAL_TABLET | ORAL | Status: DC | PRN
  Administered 2023-10-03 – 2023-10-04 (×3): 650 mg via ORAL
  Filled 2023-10-03 (×3): qty 2

## 2023-10-03 MED ORDER — POVIDONE-IODINE 10 % EX SWAB
2.0000 | Freq: Once | CUTANEOUS | Status: AC
  Administered 2023-10-03: 2 via TOPICAL

## 2023-10-03 MED ORDER — VANCOMYCIN HCL IN DEXTROSE 1-5 GM/200ML-% IV SOLN
1000.0000 mg | INTRAVENOUS | Status: AC
  Administered 2023-10-03: 1000 mg via INTRAVENOUS

## 2023-10-03 MED ORDER — GENTAMICIN SULFATE 40 MG/ML IJ SOLN
INTRAMUSCULAR | Status: AC
  Filled 2023-10-03: qty 2

## 2023-10-03 MED ORDER — AMLODIPINE BESYLATE 5 MG PO TABS
10.0000 mg | ORAL_TABLET | Freq: Every day | ORAL | Status: DC
  Administered 2023-10-04: 10 mg via ORAL
  Filled 2023-10-03: qty 2

## 2023-10-03 MED ORDER — FENTANYL CITRATE (PF) 100 MCG/2ML IJ SOLN
INTRAMUSCULAR | Status: DC | PRN
  Administered 2023-10-03 (×4): 50 ug via INTRAVENOUS

## 2023-10-03 MED ORDER — SODIUM CHLORIDE 0.9 % IV SOLN
80.0000 mg | INTRAVENOUS | Status: AC
  Administered 2023-10-03: 80 mg

## 2023-10-03 MED ORDER — VENLAFAXINE HCL ER 75 MG PO CP24
75.0000 mg | ORAL_CAPSULE | Freq: Every day | ORAL | Status: DC
  Administered 2023-10-03 – 2023-10-04 (×2): 75 mg via ORAL
  Filled 2023-10-03 (×2): qty 1

## 2023-10-03 MED ORDER — FENTANYL CITRATE (PF) 100 MCG/2ML IJ SOLN
INTRAMUSCULAR | Status: AC
Start: 2023-10-03 — End: ?
  Filled 2023-10-03: qty 2

## 2023-10-03 MED ORDER — ASPIRIN 81 MG PO TBEC
81.0000 mg | DELAYED_RELEASE_TABLET | Freq: Every day | ORAL | Status: DC
  Administered 2023-10-04: 81 mg via ORAL
  Filled 2023-10-03: qty 1

## 2023-10-03 MED ORDER — CHLORHEXIDINE GLUCONATE 4 % EX SOLN
4.0000 | Freq: Once | CUTANEOUS | Status: DC
  Filled 2023-10-03: qty 60

## 2023-10-03 NOTE — H&P (Signed)
 Electrophysiology Note:   Date:  10/03/23  ID:  Susan Jenkins, DOB [birth year], MRN [id number]   Primary Cardiologist: Janelle Mediate, MD Electrophysiologist: Ardeen Kohler, MD       History of Present Illness:   Susan Jenkins is a 78 y.o. 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 Paulita Boss.   Discussed the use of AI scribe software for clinical note transcription with the patient, who gave verbal consent to proceed.   History of Present Illness She has apical hypertrophic cardiomyopathy, confirmed by MRI and ultrasound. Extensive cardiac testing, including a Zio monitor, recorded 32 episodes of non-sustained ventricular tachycardia over two weeks. No symptoms of heart racing, fluttering, or skipping beats, and no episodes of syncope. She is unaware of the episodes occurring, even during activities such as mowing the yard or sleeping, although she does report some exertional fatigue. She has a history of knee surgery, which led her to reduce her physical activity, although she continues to participate in water  aerobics twice a week. She has no children and limited family history knowledge, but her father had heart problems. No known family history of sudden cardiac death or life-threatening heart rhythms.   Interval: Patient presents today for scheduled defibrillator implant. Reports feeling relatively well. No new or acute complaints.   Review of systems complete and found to be negative unless listed in HPI.    EP Information / Studies Reviewed:     EKG is not ordered today. EKG from 02/15/24 reviewed which showed sinus bradycardia with LVH.        Cardiac MRI 03/2023:  IMPRESSION: 1. 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  LVEF 61% RVEF 58% 3.  Mid cavitary obstruction from hypertrophied papillary muscles 4. Mildy  elevated T1 and ECV consistent with fibrosis not necessarily infiltration 5. Gaolinium enhancement full thickness in true apex as well as sub epicardial in basal septum and mid inferior wall 6.  Moderate LAE 7.  Triival appearing MR 8.  Decreased cardiac output 2.3 L/min     Echo 02/17/20:   1. Left ventricular ejection fraction, by estimation, is 60 to 65%. The  left ventricle has normal function. The left ventricle demonstrates  regional wall motion abnormalities with a small dyskinetic pouch at the  true apex. There is asymmetric  moderate-severe apical hypertrophy with the small apical dyskinetic pouch  seen best in the 4-chamber view. There is a mid-cavity LV gradient, peak  around 30 mmHg. Left ventricular diastolic parameters are consistent with  Grade I diastolic dysfunction  (impaired relaxation). This picture is most consistent with apical variant  hypertrophic cardiomyopathy. Consider cardiac MRI for delayed enhancement  evaluation/risk stratification.   2. Right ventricular systolic function is mildly reduced. The right  ventricular size is normal. Tricuspid regurgitation signal is inadequate  for assessing PA pressure.   3. Left atrial size was moderately dilated.   4. The mitral valve is normal in structure. Trivial mitral valve  regurgitation. No evidence of mitral stenosis.   5. The aortic valve is tricuspid. There is mild calcification of the  aortic valve. Aortic valve regurgitation is mild. Aortic valve  sclerosis/calcification is present, without any evidence of aortic  stenosis.   6. The inferior vena cava is normal in size with greater than 50%  respiratory variability, suggesting right atrial pressure of 3 mmHg.    Physical Exam:  Today's Vitals   10/03/23 1426 10/03/23 1439  BP: (!) 156/62   Pulse: (!) 50   Resp: 17   Temp: 97.7 F (36.5 C)   TempSrc: Oral   SpO2: 99%   Weight: 104.3 kg   Height: 5\' 6"  (1.676 m)   PainSc:  0-No pain   Body  mass index is 37.12 kg/m.  GEN: Well nourished, well developed in no acute distress NECK: No JVD CARDIAC: Normal rate, regular RESPIRATORY:  Clear to auscultation without rales, wheezing or rhonchi  ABDOMEN: Soft, non-distended EXTREMITIES:  No edema; No deformity    ASSESSMENT AND PLAN:     #. Apical HCM: Based on the AHA risk calculator, she has a 4.12% risk of SCD with 5 years, class 2a indication for ICD.  We discussed that this may be underestimated due to presence of apical aneurysm.  She is very functional, lives alone.  She has no other major comorbidities that would significantly impact her life expectancy. #. NSVT: She had 32 NSVT events over a 2 week period. Not currently on medications due to resting sinus bradycardia. - I previously had a long discussion with patient and her sister regarding her estimated risk of sudden cardiac death, management of NSVT and ICD therapy.  Unfortunately, her resting bradycardia and some concerns for exertional fatigue, limit the ability to medically treat her NSVT with beta-blockers. I told her that based on risk estimates and her functional status, living alone, that an ICD implant would be reasonable, and would choose dual-chamber ICD for atrial pacing so that patient could initiate beta-blocker therapy.  I also told her that at an age of 7, declining ICD therapy, if in accordance with her wishes, would also be very reasonable. Explained risks, benefits, and alternatives to ICD implantation, including but not limited to bleeding, infection, damage to heart or lungs, heart attack, stroke, or death.  Patient voiced understanding and ultimately decided to pursue ICD implant, which we will do today.   #. Sinus bradycardia: Appears to be asymptomatic at rest.  She does endorse some exertional fatigue.  On her Zio monitor it appears she was able to increase her heart rate appropriately. Unable to initiate BB therapy for NSVT d/t resting bradycardia. - Dual  chamber ICD.       Signed, Ardeen Kohler, MD

## 2023-10-04 ENCOUNTER — Encounter: Admitting: Family Medicine

## 2023-10-04 ENCOUNTER — Ambulatory Visit (HOSPITAL_COMMUNITY)

## 2023-10-04 ENCOUNTER — Encounter (HOSPITAL_COMMUNITY): Payer: Self-pay | Admitting: Cardiology

## 2023-10-04 DIAGNOSIS — I422 Other hypertrophic cardiomyopathy: Secondary | ICD-10-CM | POA: Diagnosis not present

## 2023-10-04 DIAGNOSIS — R001 Bradycardia, unspecified: Secondary | ICD-10-CM | POA: Diagnosis not present

## 2023-10-04 DIAGNOSIS — I10 Essential (primary) hypertension: Secondary | ICD-10-CM | POA: Diagnosis not present

## 2023-10-04 DIAGNOSIS — I517 Cardiomegaly: Secondary | ICD-10-CM | POA: Diagnosis not present

## 2023-10-04 DIAGNOSIS — E785 Hyperlipidemia, unspecified: Secondary | ICD-10-CM | POA: Diagnosis not present

## 2023-10-04 DIAGNOSIS — I253 Aneurysm of heart: Secondary | ICD-10-CM | POA: Diagnosis not present

## 2023-10-04 DIAGNOSIS — I472 Ventricular tachycardia, unspecified: Secondary | ICD-10-CM | POA: Diagnosis not present

## 2023-10-04 DIAGNOSIS — Z950 Presence of cardiac pacemaker: Secondary | ICD-10-CM | POA: Diagnosis not present

## 2023-10-04 DIAGNOSIS — Z79899 Other long term (current) drug therapy: Secondary | ICD-10-CM | POA: Diagnosis not present

## 2023-10-04 NOTE — Plan of Care (Signed)

## 2023-10-04 NOTE — Discharge Summary (Addendum)
 ELECTROPHYSIOLOGY PROCEDURE DISCHARGE SUMMARY    Patient ID: Susan Jenkins,  MRN: [id number], DOB/AGE: 1946/02/04 78 y.o.  Admit date: 10/03/2023 Discharge date: 10/04/2023  Primary Care Physician: Viola Greulich, MD Primary Cardiologist: Dr. Paulita Boss Electrophysiologist: Dr. Daneil Dunker  Primary Discharge Diagnosis:  HCM, with Aneurysm (LGE burden 6%, Aneurysm area 2.9 cm2)  NSVTs Sinus bradycardia  Secondary Discharge Diagnosis:  HTN HLD  Allergies  Allergen Reactions   Amoxicillin-Pot Clavulanate Anaphylaxis    Has patient had a PCN reaction causing immediate rash, facial/tongue/throat swelling, SOB or lightheadedness with hypotension: Yes Has patient had a PCN reaction causing severe rash involving mucus membranes or skin necrosis: Yes Has patient had a PCN reaction that required hospitalization Yes Has patient had a PCN reaction occurring within the last 10 years: No If all of the above answers are "NO", then may proceed with Cephalosporin use.   Augmentin [Amoxicillin-Pot Clavulanate] Anaphylaxis   Nickel Rash     Procedures This Admission:  1.  Implantation of a dual chamber ICD on 10/03/23 by Dr Daneil Dunker.   DFT's were deferred at time of implant CONCLUSIONS:   1. Hypertrophic cardiomyopathy and non-sustained ventricular tachycardia.   2. Successful implantation of a dual chamber ICD implanted for prevention of sudden death.   3. No early apparent complications.  2.  CXR on 10/04/23 demonstrated no pneumothorax status post device implantation.   Brief HPI: Susan Jenkins is a 78 y.o. female was referred to electrophysiology in the outpatient setting for consideration of ICD implantation.  Past medical history includes above.  The patient has HCM w/aneurysm and NSVT's as well as SB, recommended to pursue ICD  Hospital Course:  The patient was admitted and underwent implantation of an ICD with details as outlined in the procedure report. She was  monitored on telemetry overnight which demonstrated SR.  Left chest was without hematoma or ecchymosis.  The device was interrogated and found to be functioning normally.  CXR was obtained and demonstrated no pneumothorax status post device implantation.  Wound care, arm mobility, and restrictions were reviewed with the patient.  The patient feels well, denies any CP or SOB, minimal site discomfort, she was examined by Dr. Daneil Dunker and considered stable for discharge to home.  Aaron Aas   Physical Exam: Vitals:   10/03/23 2000 10/04/23 0223 10/04/23 0500 10/04/23 0802  BP: (!) 150/70 (!) 133/58 (!) 143/63 (!) 147/60  Pulse: (!) 58 (!) 56 64 (!) 59  Resp: 15 16 17 16   Temp: (!) 97.4 F (36.3 C) 98 F (36.7 C) 99.1 F (37.3 C) 98.5 F (36.9 C)  TempSrc: Axillary Oral Oral Oral  SpO2: 93% 96% 95% 96%  Weight:   102.7 kg   Height:        GEN- The patient is well appearing, alert and oriented x 3 today.   HEENT: normocephalic, atraumatic; sclera clear, conjunctiva pink; hearing intact; oropharynx clear Lungs-  CTA b/l, normal work of breathing.  No wheezes, rales, rhonchi Heart- RRR, no murmurs, rubs or gallops, PMI not laterally displaced GI- soft, non-tender, non-distended Extremities- no clubbing, cyanosis, or edema MS- no significant deformity or atrophy Skin- warm and dry, no rash or lesion, left chest without hematoma/ecchymosis Psych- euthymic mood, full affect Neuro- no gross defecits  Labs:   Lab Results  Component Value Date   WBC 6.0 09/12/2023   HGB 13.5 09/12/2023   HCT 39.0 09/12/2023   MCV 101 (H) 09/12/2023   PLT 275 09/12/2023  No results for input(s): "NA", "K", "CL", "CO2", "BUN", "CREATININE", "CALCIUM ", "PROT", "BILITOT", "ALKPHOS", "ALT", "AST", "GLUCOSE" in the last 168 hours.  Invalid input(s): "LABALBU"  Discharge Medications:  Allergies as of 10/04/2023       Reactions   Amoxicillin-pot Clavulanate Anaphylaxis   Has patient had a PCN reaction causing  immediate rash, facial/tongue/throat swelling, SOB or lightheadedness with hypotension: Yes Has patient had a PCN reaction causing severe rash involving mucus membranes or skin necrosis: Yes Has patient had a PCN reaction that required hospitalization Yes Has patient had a PCN reaction occurring within the last 10 years: No If all of the above answers are "NO", then may proceed with Cephalosporin use.   Augmentin [amoxicillin-pot Clavulanate] Anaphylaxis   Nickel Rash        Medication List     TAKE these medications    acetaminophen  650 MG CR tablet Commonly known as: TYLENOL  Take 650 mg by mouth in the morning and at bedtime.   AMBULATORY NON FORMULARY MEDICATION 2% Diltiazem  ointment Apply to the anal sphincter 5 times daily What changed:  how much to take when to take this reasons to take this additional instructions   amLODipine  10 MG tablet Commonly known as: NORVASC  Take 1 tablet (10 mg total) by mouth daily.   aspirin  EC 81 MG tablet Take 81 mg by mouth daily. Swallow whole.   CALCIUM  600-VITAMIN D3 PO Take 1,200 mg by mouth daily.   Fish Oil 1000 MG Caps Take 1,000 mg by mouth daily.   GLUCOSAMINE CHONDROITIN ADV PO Take 1 tablet by mouth 2 (two) times daily. Msm   hydrochlorothiazide  25 MG tablet Commonly known as: HYDRODIURIL  Take 1 tablet (25 mg total) by mouth daily.   latanoprost  0.005 % ophthalmic solution Commonly known as: XALATAN  Place 1 drop into both eyes at bedtime.   loratadine  10 MG tablet Commonly known as: CLARITIN  Take 10 mg by mouth daily.   meclizine  25 MG tablet Commonly known as: ANTIVERT  Take 1 tablet (25 mg total) by mouth 3 (three) times daily as needed for dizziness.   MELATONIN PO Take 3 mg by mouth at bedtime.   METAMUCIL FIBER PO Take 1 Package by mouth daily as needed (Constipation).   MULTIVITAMIN ADULT PO Take 1 tablet by mouth daily.   ondansetron  4 MG tablet Commonly known as: ZOFRAN  Take 1 tablet (4 mg  total) by mouth every 6 (six) hours as needed for nausea.   OVER THE COUNTER MEDICATION Place 2 drops under the tongue in the morning. [place]   PROBIOTIC PO Take 1 capsule by mouth daily.   pyridOXINE  100 MG tablet Commonly known as: VITAMIN B6 Take 100 mg by mouth daily.   rosuvastatin  10 MG tablet Commonly known as: CRESTOR  Take 1 tablet (10 mg total) by mouth daily.   timolol  0.5 % ophthalmic solution Commonly known as: TIMOPTIC  Place 1 drop into both eyes 2 (two) times daily.   venlafaxine  XR 75 MG 24 hr capsule Commonly known as: EFFEXOR -XR Take 1 capsule (75 mg total) by mouth daily.        Disposition: home Discharge Instructions     Diet - low sodium heart healthy   Complete by: As directed    Increase activity slowly   Complete by: As directed        Follow-up Information     Viola Greulich, MD. Go on 10/10/2023.   Specialty: Family Medicine Why: @9 :00am please arrive 15 minutes early Contact information: [address]  [address] [place] [address] [phone]                 Signed, Mertha Abrahams, PA-C 10/04/2023 10:06 AM   I have seen, examined the patient, and reviewed the above assessment and plan.    Hospital Course: Patient underwent scheduled dual-chamber ICD implant on 10/03/2023 for hypertrophic cardiomyopathy, nonsustained ventricular tachycardia, and sinus bradycardia.  She was kept overnight for routine monitoring.  There were no acute overnight events.  This morning, she reported feeling relatively well.  Some soreness in the left chest.  No new or acute complaints.  General: Well developed, in no acute distress.  Neck: No JVD.  Cardiac: Normal rate, regular rhythm. Left chest ICD pocket without bleeding or hematoma Resp: Normal work of breathing.  Ext: No edema.  Neuro: No gross focal deficits.  Psych: Normal affect.   Plan:  - Chest x-ray with appropriate lead position and no pneumothorax. -Bedside device  interrogation was performed with appropriate device function and stable lead parameters. -Usual post implant instructions regarding activity restrictions and wound care were provided. -Follow-up in device clinic in 10 to 14 days.  Duration of Discharge Encounter: 45 minutes  Ardeen Kohler, MD 10/04/2023 8:55 PM

## 2023-10-04 NOTE — Progress Notes (Signed)
 DISCHARGE NOTE HOME Susan Jenkins to be discharged Home per MD order. Discussed prescriptions and follow up appointments with the patient. Prescriptions given to patient; medication list explained in detail. Patient verbalized understanding.  Skin clean, dry and intact without evidence of skin break down, no evidence of skin tears noted. IV catheter discontinued intact. Site without signs and symptoms of complications. Dressing and pressure applied. Pt denies pain at the site currently. No complaints noted.  Patient free of lines, drains, and wounds.   An After Visit Summary (AVS) was printed and given to the patient. Patient escorted via wheelchair, and discharged home via private auto.  Tonda Francisco, RN

## 2023-10-04 NOTE — Plan of Care (Signed)
  Problem: Education: Goal: Knowledge of cardiac device and self-care will improve Outcome: Adequate for Discharge Goal: Ability to safely manage health related needs after discharge will improve Outcome: Adequate for Discharge Goal: Individualized Educational Video(s) Outcome: Adequate for Discharge

## 2023-10-04 NOTE — Discharge Instructions (Signed)
 After Your ICD (Implantable Cardiac Defibrillator)   You have a Environmental manager ICD  ACTIVITY Do not lift your arm above shoulder height for 1 week after your procedure. After 7 days, you may progress as below.  You should remove your sling 24 hours after your procedure, unless otherwise instructed by your provider.     Tuesday October 11, 2023  Wednesday October 12, 2023 Thursday October 13, 2023 Friday October 14, 2023   Do not lift, push, pull, or carry anything over 10 pounds with the affected arm until 6 weeks (Tuesday November 15, 2023 ) after your procedure.   You may drive AFTER your wound check, unless you have been told otherwise by your provider.   Ask your healthcare provider when you can go back to work   INCISION/Dressing If you are on a blood thinner such as Coumadin, Xarelto , Eliquis, Plavix, or Pradaxa please confirm with your provider when this should be resumed.   If large square, outer bandage is left in place, this can be removed after 24 hours from your procedure. Do not remove steri-strips or glue as below.   Monitor your defibrillator site for redness, swelling, and drainage. Call the device clinic at [phone] if you experience these symptoms or fever/chills.  If your incision is sealed with Steri-strips or staples, you may shower 7 days after your procedure or when told by your provider. Do not remove the steri-strips or let the shower hit directly on your site. You may wash around your site with soap and water .    If you were discharged in a sling, please do not wear this during the day more than 48 hours after your surgery unless otherwise instructed. This may increase the risk of stiffness and soreness in your shoulder.   Avoid lotions, ointments, or perfumes over your incision until it is well-healed.  You may use a hot tub or a pool AFTER your wound check appointment if the incision is completely closed.  Your ICD is designed to protect you from life threatening  heart rhythms. Because of this, you may receive a shock.   1 shock with no symptoms:  Call the office during business hours. 1 shock with symptoms (chest pain, chest pressure, dizziness, lightheadedness, shortness of breath, overall feeling unwell):  Call 911. If you experience 2 or more shocks in 24 hours:  Call 911. If you receive a shock, you should not drive for 6 months per the [place] DMV IF you receive appropriate therapy from your ICD.   ICD Alerts:  Some alerts are vibratory and others beep. These are NOT emergencies. Please call our office to let us  know. If this occurs at night or on weekends, it can wait until the next business day. Send a remote transmission.  If your device is capable of reading fluid status (for heart failure), you will be offered monthly monitoring to review this with you.   DEVICE MANAGEMENT Remote monitoring is used to monitor your ICD from home. This monitoring is scheduled every 91 days by our office. It allows us  to keep an eye on the functioning of your device to ensure it is working properly. You will routinely see your Electrophysiologist annually (more often if necessary).   You should receive your ID card for your new device in 4-8 weeks. Keep this card with you at all times once received. Consider wearing a medical alert bracelet or necklace.  Your ICD  may be MRI compatible. This will be discussed at your  next office visit/wound check.  You should avoid contact with strong electric or magnetic fields.   Do not use amateur (ham) radio equipment or electric (arc) welding torches. MP3 player headphones with magnets should not be used. Some devices are safe to use if held at least 12 inches (30 cm) from your defibrillator. These include power tools, lawn mowers, and speakers. If you are unsure if something is safe to use, ask your health care provider.  When using your cell phone, hold it to the ear that is on the opposite side from the defibrillator. Do not  leave your cell phone in a pocket over the defibrillator.  You may safely use electric blankets, heating pads, computers, and microwave ovens.  Call the office right away if: You have chest pain. You feel more than one shock. You feel more short of breath than you have felt before. You feel more light-headed than you have felt before. Your incision starts to open up.  This information is not intended to replace advice given to you by your health care provider. Make sure you discuss any questions you have with your health care provider.

## 2023-10-05 ENCOUNTER — Encounter: Payer: Self-pay | Admitting: Emergency Medicine

## 2023-10-05 MED FILL — Vancomycin HCl-Dextrose IV Soln 1 GM/200ML-5%: INTRAVENOUS | Qty: 200 | Status: AC

## 2023-10-05 MED FILL — Fentanyl Citrate Preservative Free (PF) Inj 100 MCG/2ML: INTRAMUSCULAR | Qty: 4 | Status: AC

## 2023-10-10 ENCOUNTER — Inpatient Hospital Stay: Admitting: Family Medicine

## 2023-10-12 ENCOUNTER — Ambulatory Visit (INDEPENDENT_AMBULATORY_CARE_PROVIDER_SITE_OTHER): Admitting: Family Medicine

## 2023-10-12 ENCOUNTER — Encounter: Payer: Self-pay | Admitting: Family Medicine

## 2023-10-12 VITALS — BP 136/86 | HR 97 | Temp 98.1°F | Ht 66.0 in | Wt 228.2 lb

## 2023-10-12 DIAGNOSIS — T148XXA Other injury of unspecified body region, initial encounter: Secondary | ICD-10-CM

## 2023-10-12 DIAGNOSIS — F339 Major depressive disorder, recurrent, unspecified: Secondary | ICD-10-CM

## 2023-10-12 DIAGNOSIS — K529 Noninfective gastroenteritis and colitis, unspecified: Secondary | ICD-10-CM | POA: Diagnosis not present

## 2023-10-12 DIAGNOSIS — I10 Essential (primary) hypertension: Secondary | ICD-10-CM | POA: Diagnosis not present

## 2023-10-12 DIAGNOSIS — R7303 Prediabetes: Secondary | ICD-10-CM

## 2023-10-12 DIAGNOSIS — Z95810 Presence of automatic (implantable) cardiac defibrillator: Secondary | ICD-10-CM | POA: Diagnosis not present

## 2023-10-12 DIAGNOSIS — K5909 Other constipation: Secondary | ICD-10-CM

## 2023-10-12 DIAGNOSIS — Z09 Encounter for follow-up examination after completed treatment for conditions other than malignant neoplasm: Secondary | ICD-10-CM

## 2023-10-12 DIAGNOSIS — E785 Hyperlipidemia, unspecified: Secondary | ICD-10-CM

## 2023-10-12 NOTE — Progress Notes (Signed)
 Established Patient Office Visit   Subjective  Patient ID: Susan Jenkins, female    DOB: 09/12/1945  Age: 78 y.o. MRN: [id number]  Chief Complaint  Patient presents with   Medical Management of Chronic Issues    Hospital follow-up   Patient accompanied by her sister.  Patient is a 78 year old female seen for follow-up.  She underwent pacemaker placement surgery on 10/03/2023.  During the surgery, her IV infiltrated, causing pruritus and discomfort.  Pt with soreness at the site of the ICD placement. The area was initially covered with a large bandage, and now there are steri-strips over the site. She describes the site as sore and slightly red, with some yellow discoloration. She has been using ice and Tylenol  for pain management.  She reports diarrhea following the surgery, which she attributes to gastroenteritis contracted at the hospital as several of her friend and sister stayed with her also became sick. She took Pepto-Bismol to manage the diarrhea but has not had a bowel movement since Friday. She is considering using MiraLAX  to address constipation.  She has not been able to engage in her usual physical activities, such as walking and water  aerobics, since the surgery. She plans to resume these activities once given the ok.    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) 06/06/2023   Osteoarthritis of left knee 02/28/2023   OA (osteoarthritis) of knee 07/18/2017   Allergic contact dermatitis due to metals 06/30/2017   Acute colitis 09/29/2015   Hyponatremia 09/29/2015   Family history of cancer of GI tract 08/29/2013   Hypertrophic cardiomyopathy (HCC) 06/11/2010   OTHER SPECIFIED DISORDERS OF LIVER 05/07/2010   UNSPECIFIED VITAMIN D DEFICIENCY 03/17/2010   Hyperlipemia 03/17/2010   DEPRESSION 03/17/2010   GLUCOMA 03/17/2010   DIPLOPIA 03/17/2010   Essential  hypertension 03/17/2010   MITRAL VALVE PROLAPSE 03/17/2010   UNSPEC HEMORRHOIDS WITHOUT MENTION COMPLICATION 03/17/2010   BLOOD IN STOOL 03/17/2010   JOINT PAIN, HAND 03/17/2010   SCIATICA 03/17/2010   BACK PAIN 03/17/2010   Past Medical History:  Diagnosis Date   Complication of anesthesia    Depression    Glaucoma    Heart murmur    Hemorrhoids    History of diverticulitis 09/2015   HOCM (hypertrophic obstructive cardiomyopathy) (HCC)    Hyperlipidemia    Hypertension    PONV (postoperative nausea and vomiting)    Pulmonary embolus (HCC) 1971   following surgery   Vitamin D deficiency    Past Surgical History:  Procedure Laterality Date   ABDOMINAL AORTIC ANEURYSM REPAIR     ANKLE SURGERY     BACK SURGERY     CARDIAC CATHETERIZATION     DG THUMB LEFT HAND     tendon   ICD IMPLANT N/A 10/03/2023   Procedure: ICD IMPLANT;  Surgeon: Ardeen Kohler, MD;  [facility] INVASIVE CV LAB;  Service: Cardiovascular;  Laterality: N/A;   NASAL SEPTUM SURGERY     TOTAL KNEE ARTHROPLASTY Right 07/18/2017   Procedure: RIGHT TOTAL KNEE ARTHROPLASTY;  Surgeon: Liliane Rei, MD;  Location: [facility];  Service: Orthopedics;  Laterality: Right;   TOTAL KNEE ARTHROPLASTY Left 02/28/2023   Procedure: TOTAL KNEE ARTHROPLASTY;  Surgeon: Liliane Rei, MD;  Location: [facility];  Service: Orthopedics;  Laterality: Left;   Social History   Tobacco Use   Smoking status: Former    Current packs/day: 0.00  Types: Cigarettes    Quit date: 05/03/1980    Years since quitting: 43.4   Smokeless tobacco: Never   Tobacco comments:    a .50 a pack;   Vaping Use   Vaping status: Never Used  Substance Use Topics   Alcohol use: Yes    Comment: rarely   Drug use: No   Family History  Problem Relation Age of Onset   Dementia Mother    Colon cancer Father 69   Heart failure Father    Heart attack Father    Esophageal cancer Maternal Grandmother    Allergic rhinitis Neg Hx    Asthma Neg Hx     Eczema Neg Hx    Urticaria Neg Hx    Angioedema Neg Hx    Rectal cancer Neg Hx    Stomach cancer Neg Hx    Allergies  Allergen Reactions   Amoxicillin-Pot Clavulanate Anaphylaxis    Has patient had a PCN reaction causing immediate rash, facial/tongue/throat swelling, SOB or lightheadedness with hypotension: Yes Has patient had a PCN reaction causing severe rash involving mucus membranes or skin necrosis: Yes Has patient had a PCN reaction that required hospitalization Yes Has patient had a PCN reaction occurring within the last 10 years: No If all of the above answers are NO, then may proceed with Cephalosporin use.   Augmentin [Amoxicillin-Pot Clavulanate] Anaphylaxis   Nickel Rash    ROS Negative unless stated above    Objective:      BP 136/86 (BP Location: Left Arm, Patient Position: Sitting, Cuff Size: Normal)   Pulse 97   Temp 98.1 F (36.7 C) (Oral)   Ht 5' 6 (1.676 m)   Wt 228 lb 3.2 oz (103.5 kg)   SpO2 95%   BMI 36.83 kg/m  BP Readings from Last 3 Encounters:  10/12/23 136/86  10/04/23 (!) 147/60  09/15/23 120/82   Wt Readings from Last 3 Encounters:  10/12/23 228 lb 3.2 oz (103.5 kg)  10/04/23 226 lb 6.6 oz (102.7 kg)  09/15/23 229 lb 6.4 oz (104.1 kg)      Physical Exam Constitutional:      General: She is not in acute distress.    Appearance: Normal appearance.  HENT:     Head: Normocephalic and atraumatic.     Nose: Nose normal.     Mouth/Throat:     Mouth: Mucous membranes are moist.   Cardiovascular:     Rate and Rhythm: Normal rate and regular rhythm.     Heart sounds: Normal heart sounds. No murmur heard.    No gallop.  Pulmonary:     Effort: Pulmonary effort is normal. No respiratory distress.     Breath sounds: Normal breath sounds. No wheezing, rhonchi or rales.   Skin:    General: Skin is warm and dry.     Comments: Left upper chest with Steri-Strips in place s/p ICD placement.  A 4.5 cm x 4 cm hematoma lateral to the  implanted ICD with mild TTP.  Faint erythema inferior and superior to Steri-Strips with mild edema of skin where it appears as adhesive is present.  No drainage or increased warmth noted.   Neurological:     Mental Status: She is alert and oriented to person, place, and time.        10/12/2023   10:21 AM 09/15/2023   11:49 AM 01/13/2023    8:17 AM  Depression screen PHQ 2/9  Decreased Interest 0 0 0  Down, Depressed, Hopeless  0 0 0  PHQ - 2 Score 0 0 0  Altered sleeping 0 1 2  Tired, decreased energy 0 1 2  Change in appetite 0 0 0  Feeling bad or failure about yourself  0 0 0  Trouble concentrating 0 0 0  Moving slowly or fidgety/restless 0 0 0  Suicidal thoughts 0 0 0  PHQ-9 Score 0 2 4  Difficult doing work/chores  Not difficult at all Not difficult at all      10/12/2023   10:22 AM 09/15/2023   11:49 AM 01/13/2023    8:17 AM 06/23/2022    2:09 PM  GAD 7 : Generalized Anxiety Score  Nervous, Anxious, on Edge 0 1 0 0  Control/stop worrying 0 0 0 0  Worry too much - different things 0 0 0 0  Trouble relaxing 0 0 0 0  Restless 0 0 0 0  Easily annoyed or irritable 0 0 0 0  Afraid - awful might happen 0 0 0 0  Total GAD 7 Score 0 1 0 0  Anxiety Difficulty  Not difficult at all Not difficult at all Not difficult at all     No results found for any visits on 10/12/23.    Assessment & Plan:   Hematoma  ICD (implantable cardioverter-defibrillator) in place  Essential hypertension  Prediabetes  Gastroenteritis  Other constipation   ICD in place/site Hematoma   She has a mild hematoma with soreness and lateral to ICD site, but no infection is present. Skin irritation is due to adhesive tape. The condition is expected to resolve spontaneously. Apply ice or warm compress to the hematoma site. Return for wound and device check on June 19 with cardiology.  Hypertension   Her blood pressure is 140/80 mmHg, possibly influenced by surgery, pain, and sodium intake. She was  advised on a low sodium diet. Monitor blood pressure regularly and adopt a low sodium diet, aiming for 2000-2500 mg of sodium per day.  Gastroenteritis Likely viral.  Now resolved.  Continue to monitor.  Constipation Secondary to Pepto-Bismol use for gastroenteritis.  MiraLAX  as needed.  Increase intake of fluids and fiber.  Return if symptoms worsen or fail to improve.   Viola Greulich, MD

## 2023-10-20 ENCOUNTER — Ambulatory Visit: Attending: Cardiovascular Disease

## 2023-10-20 DIAGNOSIS — I422 Other hypertrophic cardiomyopathy: Secondary | ICD-10-CM

## 2023-10-20 NOTE — Progress Notes (Signed)
 Normal DUAL chamber ICD wound check. Wound well healed. Presenting rhythm: AS/VS 54 . Routine testing performed. Thresholds, sensing, and impedances consistent with implant measurements with 3.5V safety margin/auto capture until 3 month visit. No treated arrhythmias. Reviewed arm restrictions to continue for 6 weeks total post op. Reviewed shock plan.  Pt enrolled in remote follow-up.

## 2023-10-20 NOTE — Patient Instructions (Addendum)
   After Your ICD (Implantable Cardiac Defibrillator)    Monitor your defibrillator site for redness, swelling, and drainage. Call the device clinic at [phone] if you experience these symptoms or fever/chills.  Your incision was closed with Steri-strips or staples:  You may shower 7 days after your procedure and wash your incision with soap and water . Avoid lotions, ointments, or perfumes over your incision until it is well-healed.  You may use a hot tub or a pool after your wound check appointment if the incision is completely closed.  Do not lift, push or pull greater than 10 pounds with the affected arm until 6 weeks after your procedure. UNTIL AFTER JULY 14TH.  There are no other restrictions in arm movement after your wound check appointment.  Your ICD is designed to protect you from life threatening heart rhythms. Because of this, you may receive a shock.   1 shock with no symptoms:  Call the office during business hours. 1 shock with symptoms (chest pain, chest pressure, dizziness, lightheadedness, shortness of breath, overall feeling unwell):  Call 911. If you experience 2 or more shocks in 24 hours:  Call 911. If you receive a shock, you should not drive.  [place] DMV - no driving for 6 months if you receive appropriate therapy from your ICD.   ICD Alerts:  Some alerts are vibratory and others beep. These are NOT emergencies. Please call our office to let us  know. If this occurs at night or on weekends, it can wait until the next business day. Send a remote transmission.  If your device is capable of reading fluid status (for heart failure), you will be offered monthly monitoring to review this with you.   Remote monitoring is used to monitor your ICD from home. This monitoring is scheduled every 91 days by our office. It allows us  to keep an eye on the functioning of your device to ensure it is working properly. You will routinely see your Electrophysiologist annually (more often  if necessary).

## 2023-10-25 DIAGNOSIS — M542 Cervicalgia: Secondary | ICD-10-CM | POA: Diagnosis not present

## 2023-10-25 DIAGNOSIS — M9903 Segmental and somatic dysfunction of lumbar region: Secondary | ICD-10-CM | POA: Diagnosis not present

## 2023-10-25 DIAGNOSIS — M9902 Segmental and somatic dysfunction of thoracic region: Secondary | ICD-10-CM | POA: Diagnosis not present

## 2023-10-25 DIAGNOSIS — M9901 Segmental and somatic dysfunction of cervical region: Secondary | ICD-10-CM | POA: Diagnosis not present

## 2023-11-09 NOTE — Progress Notes (Signed)
 Patient calls in and requests that we mail her a letter to give her physical therapist and her gym.  They are asking to know when/what procedure she had and if she is cleared for full activity. Patient was okay with me providing these details in the letter.   Letter placed in mail today per patient's request.

## 2023-11-15 DIAGNOSIS — M542 Cervicalgia: Secondary | ICD-10-CM | POA: Diagnosis not present

## 2023-11-15 DIAGNOSIS — M5451 Vertebrogenic low back pain: Secondary | ICD-10-CM | POA: Diagnosis not present

## 2023-11-15 DIAGNOSIS — M9901 Segmental and somatic dysfunction of cervical region: Secondary | ICD-10-CM | POA: Diagnosis not present

## 2023-11-15 DIAGNOSIS — M5137 Other intervertebral disc degeneration, lumbosacral region with discogenic back pain only: Secondary | ICD-10-CM | POA: Diagnosis not present

## 2023-11-16 DIAGNOSIS — H401132 Primary open-angle glaucoma, bilateral, moderate stage: Secondary | ICD-10-CM | POA: Diagnosis not present

## 2023-11-16 DIAGNOSIS — H52203 Unspecified astigmatism, bilateral: Secondary | ICD-10-CM | POA: Diagnosis not present

## 2023-11-16 DIAGNOSIS — H2513 Age-related nuclear cataract, bilateral: Secondary | ICD-10-CM | POA: Diagnosis not present

## 2023-11-17 ENCOUNTER — Ambulatory Visit

## 2023-11-17 DIAGNOSIS — I422 Other hypertrophic cardiomyopathy: Secondary | ICD-10-CM

## 2023-11-18 LAB — CUP PACEART REMOTE DEVICE CHECK
Battery Remaining Longevity: 156 mo
Battery Remaining Percentage: 100 %
Brady Statistic RA Percent Paced: 8 %
Brady Statistic RV Percent Paced: 0 %
Date Time Interrogation Session: 20250717043300
HighPow Impedance: 63 Ohm
Implantable Lead Connection Status: 753985
Implantable Lead Connection Status: 753985
Implantable Lead Implant Date: 20250602
Implantable Lead Implant Date: 20250602
Implantable Lead Location: 753859
Implantable Lead Location: 753860
Implantable Lead Model: 672
Implantable Lead Model: 7841
Implantable Lead Serial Number: 1597713
Implantable Lead Serial Number: 271599
Implantable Pulse Generator Implant Date: 20250602
Lead Channel Impedance Value: 451 Ohm
Lead Channel Impedance Value: 534 Ohm
Lead Channel Pacing Threshold Amplitude: 0.4 V
Lead Channel Pacing Threshold Amplitude: 0.7 V
Lead Channel Pacing Threshold Pulse Width: 0.4 ms
Lead Channel Pacing Threshold Pulse Width: 0.4 ms
Lead Channel Setting Pacing Amplitude: 3.5 V
Lead Channel Setting Pacing Amplitude: 3.5 V
Lead Channel Setting Pacing Pulse Width: 0.4 ms
Lead Channel Setting Sensing Sensitivity: 0.5 mV
Pulse Gen Serial Number: 701814
Zone Setting Status: 755011

## 2023-11-20 ENCOUNTER — Ambulatory Visit: Payer: Self-pay | Admitting: Cardiology

## 2023-11-23 ENCOUNTER — Encounter: Admitting: Family Medicine

## 2023-12-06 ENCOUNTER — Telehealth: Payer: Self-pay | Admitting: Cardiology

## 2023-12-06 NOTE — Telephone Encounter (Signed)
 Reason for walk-in: Walk-in Reasons: form/record drop-off (for provider mailbox or FMLA, etc.)   If patient is requesting to be seen today, or if patient is having symptoms:   What symptoms are being reported (if any)?  N/a   Route to triage pool and ensure Teams message has been sent to the Triage [facility] chat.   3.   For medication samples, medication refills, HIM requests, appointment requests, lab-related requests, or form/record drop-off, please route to the appropriate pool.    Patient is asking for a letter to have therapy and start back water  aerobics

## 2023-12-06 NOTE — Telephone Encounter (Signed)
 Patient is more than 6 weeks post ICD implant. Okay per Dr. Kennyth. Letter has been provided to the patient.

## 2023-12-27 DIAGNOSIS — M9901 Segmental and somatic dysfunction of cervical region: Secondary | ICD-10-CM | POA: Diagnosis not present

## 2023-12-27 DIAGNOSIS — M542 Cervicalgia: Secondary | ICD-10-CM | POA: Diagnosis not present

## 2023-12-27 DIAGNOSIS — M5451 Vertebrogenic low back pain: Secondary | ICD-10-CM | POA: Diagnosis not present

## 2023-12-27 DIAGNOSIS — M5137 Other intervertebral disc degeneration, lumbosacral region with discogenic back pain only: Secondary | ICD-10-CM | POA: Diagnosis not present

## 2024-01-03 ENCOUNTER — Ambulatory Visit: Admitting: Cardiology

## 2024-01-12 NOTE — Progress Notes (Unsigned)
 Electrophysiology Office Note:   Date:  01/14/2024  ID:  Susan Jenkins, DOB 1946/03/26, MRN [id number]  Primary Cardiologist: Maude Emmer, MD Electrophysiologist: Fonda Kitty, MD      History of Present Illness:   Susan Jenkins is a 78 y.o. 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  Discussed the use of AI scribe software for clinical note transcription with the patient, who gave verbal consent to proceed.  History of Present Illness Susan Jenkins is a 78 year old female with hypertrophic cardiomyopathy who presents for a three-month follow-up post ICD implant.  She underwent ICD implant on 11/17/2023.  She is generally well but currently is experiencing cold-like symptoms and congestion. No fever is present.  She has a history of hypertrophic cardiomyopathy, which led to the ICD implantation to manage the risk of ventricular tachycardia.  ICD pocket has healed well.  No pain, bleeding, swelling.  She is currently not on a specific schedule for follow-up with her cardiologist but acknowledges the need for periodic check-ups.  Other than cold-like symptoms, no new or acute complaints today.  Review of systems complete and found to be negative unless listed in HPI.   EP Information / Studies Reviewed:    EKG is not ordered today. EKG from 10/04/23 reviewed which showed sinus bradycardia with LVH.      Cardiac MRI 03/2023:  IMPRESSION: 1. 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  LVEF 61% RVEF 58% 3.  Mid cavitary obstruction from hypertrophied papillary muscles 4. Mildy elevated T1 and ECV consistent with fibrosis not necessarily infiltration 5. Gaolinium enhancement full thickness in true apex as well as sub epicardial in basal septum and mid inferior wall 6.  Moderate LAE 7.  Triival appearing MR 8.  Decreased cardiac  output 2.3 L/min    Echo 02/17/20:   1. Left ventricular ejection fraction, by estimation, is 60 to 65%. The  left ventricle has normal function. The left ventricle demonstrates  regional wall motion abnormalities with a small dyskinetic pouch at the  true apex. There is asymmetric  moderate-severe apical hypertrophy with the small apical dyskinetic pouch  seen best in the 4-chamber view. There is a mid-cavity LV gradient, peak  around 30 mmHg. Left ventricular diastolic parameters are consistent with  Grade I diastolic dysfunction  (impaired relaxation). This picture is most consistent with apical variant  hypertrophic cardiomyopathy. Consider cardiac MRI for delayed enhancement  evaluation/risk stratification.   2. Right ventricular systolic function is mildly reduced. The right  ventricular size is normal. Tricuspid regurgitation signal is inadequate  for assessing PA pressure.   3. Left atrial size was moderately dilated.   4. The mitral valve is normal in structure. Trivial mitral valve  regurgitation. No evidence of mitral stenosis.   5. The aortic valve is tricuspid. There is mild calcification of the  aortic valve. Aortic valve regurgitation is mild. Aortic valve  sclerosis/calcification is present, without any evidence of aortic  stenosis.   6. The inferior vena cava is normal in size with greater than 50%  respiratory variability, suggesting right atrial pressure of 3 mmHg.   Physical Exam:   VS:  BP 119/69 (BP Location: Left Arm, Patient Position: Sitting, Cuff Size: Large)   Pulse 61   Ht 5' 6 (1.676 m)   Wt 224 lb (101.6 kg)   SpO2 95%   BMI 36.15 kg/m  Wt Readings from Last 3 Encounters:  01/13/24 224 lb (101.6 kg)  10/12/23 228 lb 3.2 oz (103.5 kg)  10/04/23 226 lb 6.6 oz (102.7 kg)     GEN: Well nourished, well developed in no acute distress NECK: No JVD CARDIAC: Normal rate, regular.  Well-healed left chest ICD pocket. RESPIRATORY:  Clear to  auscultation without rales, wheezing or rhonchi  ABDOMEN: Soft, non-distended EXTREMITIES:  No edema; No deformity   ASSESSMENT AND PLAN:    #. S/p ICD - In-clinic ICD interrogation performed today.  Appropriate device function and stable lead parameters.  No arrhythmias. - Continue remote monitoring.  #. Apical HCM: Based on the AHA risk calculator, she has a 4.12% risk of SCD with 5 years, class 2a indication for ICD.  We discussed that this may be underestimated due to presence of apical aneurysm.  She is very functional, lives alone.  She has no other major comorbidities that would significantly impact her life expectancy. #. NSVT: She had 32 NSVT events over a 2 week period. Not currently on medications due to resting sinus bradycardia. -Given that she now has dual-chamber ICD in place, can start metoprolol if needed. - Close follow-up with Dr. Santo.  Signed, Fonda Kitty, MD

## 2024-01-13 ENCOUNTER — Ambulatory Visit
Admission: EM | Admit: 2024-01-13 | Discharge: 2024-01-13 | Disposition: A | Discharge status: Home / Self Care | Attending: Family Medicine | Admitting: Family Medicine

## 2024-01-13 ENCOUNTER — Encounter: Payer: Self-pay | Admitting: Internal Medicine

## 2024-01-13 ENCOUNTER — Ambulatory Visit: Attending: Cardiology | Admitting: Cardiology

## 2024-01-13 ENCOUNTER — Ambulatory Visit: Payer: Self-pay

## 2024-01-13 ENCOUNTER — Encounter: Payer: Self-pay | Admitting: Cardiology

## 2024-01-13 VITALS — BP 119/69 | HR 61 | Ht 66.0 in | Wt 224.0 lb

## 2024-01-13 DIAGNOSIS — Z95810 Presence of automatic (implantable) cardiac defibrillator: Secondary | ICD-10-CM

## 2024-01-13 DIAGNOSIS — I422 Other hypertrophic cardiomyopathy: Secondary | ICD-10-CM | POA: Diagnosis not present

## 2024-01-13 DIAGNOSIS — R001 Bradycardia, unspecified: Secondary | ICD-10-CM

## 2024-01-13 DIAGNOSIS — I4729 Other ventricular tachycardia: Secondary | ICD-10-CM

## 2024-01-13 DIAGNOSIS — U071 COVID-19: Secondary | ICD-10-CM | POA: Diagnosis not present

## 2024-01-13 DIAGNOSIS — Z20822 Contact with and (suspected) exposure to covid-19: Secondary | ICD-10-CM | POA: Diagnosis not present

## 2024-01-13 LAB — CUP PACEART INCLINIC DEVICE CHECK
Date Time Interrogation Session: 20250912110510
HighPow Impedance: 72 Ohm
Implantable Lead Connection Status: 753985
Implantable Lead Connection Status: 753985
Implantable Lead Implant Date: 20250602
Implantable Lead Implant Date: 20250602
Implantable Lead Location: 753859
Implantable Lead Location: 753860
Implantable Lead Model: 672
Implantable Lead Model: 7841
Implantable Lead Serial Number: 1597713
Implantable Lead Serial Number: 271599
Implantable Pulse Generator Implant Date: 20250602
Lead Channel Impedance Value: 461 Ohm
Lead Channel Impedance Value: 645 Ohm
Lead Channel Pacing Threshold Amplitude: 0.6 V
Lead Channel Pacing Threshold Amplitude: 0.9 V
Lead Channel Pacing Threshold Pulse Width: 0.4 ms
Lead Channel Pacing Threshold Pulse Width: 0.4 ms
Lead Channel Sensing Intrinsic Amplitude: 25 mV
Lead Channel Sensing Intrinsic Amplitude: 4 mV
Lead Channel Setting Pacing Amplitude: 2.5 V
Lead Channel Setting Pacing Amplitude: 2.5 V
Lead Channel Setting Pacing Pulse Width: 0.4 ms
Lead Channel Setting Sensing Sensitivity: 0.5 mV
Pulse Gen Serial Number: 701814
Zone Setting Status: 755011

## 2024-01-13 LAB — POC SOFIA SARS ANTIGEN FIA: SARS Coronavirus 2 Ag: POSITIVE — AB

## 2024-01-13 MED ORDER — PROMETHAZINE-DM 6.25-15 MG/5ML PO SYRP: 5.0000 mL | ORAL_SOLUTION | Freq: Three times a day (TID) | ORAL | 0 refills | Status: AC | PRN

## 2024-01-13 MED ORDER — PAXLOVID (300/100) 20 X 150 MG & 10 X 100MG PO TBPK: ORAL_TABLET | ORAL | 0 refills | Status: AC

## 2024-01-13 NOTE — ED Provider Notes (Signed)
 [facility]  Note:  This document was prepared using Conservation officer, historic buildings and may include unintentional dictation errors.  MRN: [id number] DOB: 06-May-1945  Subjective:   Susan Jenkins is a 78 y.o. female presenting for 1 day history of malaise, fatigue, persistent coughing, sinus congestion, body pains, headaches, sweats.  Had exposure to COVID-19 2 days ago.  Has been using Tylenol  Cold and flu without relief.  Would like COVID antiviral.  Last GFR was greater than 60 mL/min May 2025.  No current facility-administered medications for this encounter.  Current Outpatient Medications:    Pseudoephedrine-APAP-DM (TYLENOL  COLD/FLU DAY PO), Take by mouth., Disp: , Rfl:    acetaminophen  (TYLENOL ) 650 MG CR tablet, Take 650 mg by mouth in the morning and at bedtime., Disp: , Rfl:    AMBULATORY NON FORMULARY MEDICATION, 2% Diltiazem  ointment Apply to the anal sphincter 5 times daily (Patient taking differently: 1 Application 2 (two) times daily as needed. 2% Diltiazem  ointment Apply to the anal sphincter), Disp: 30 g, Rfl: 2   amLODipine  (NORVASC ) 10 MG tablet, Take 1 tablet (10 mg total) by mouth daily., Disp: 90 tablet, Rfl: 3   aspirin  EC 81 MG tablet, Take 81 mg by mouth daily. Swallow whole., Disp: , Rfl:    Calcium  Carbonate-Vitamin D (CALCIUM  600-VITAMIN D3 PO), Take 1,200 mg by mouth daily., Disp: , Rfl:    hydrochlorothiazide  (HYDRODIURIL ) 25 MG tablet, Take 1 tablet (25 mg total) by mouth daily., Disp: 90 tablet, Rfl: 3   latanoprost  (XALATAN ) 0.005 % ophthalmic solution, Place 1 drop into both eyes at bedtime., Disp: , Rfl:    loratadine  (CLARITIN ) 10 MG tablet, Take 10 mg by mouth daily., Disp: , Rfl:    meclizine  (ANTIVERT ) 25 MG tablet, Take 1 tablet (25 mg total) by mouth 3 (three) times daily as needed for dizziness., Disp: 30 tablet, Rfl: 0   MELATONIN PO, Take 3 mg by mouth at bedtime., Disp: , Rfl:    METAMUCIL FIBER PO, Take 1  Package by mouth daily as needed (Constipation)., Disp: , Rfl:    Misc Natural Products (GLUCOSAMINE CHONDROITIN ADV PO), Take 1 tablet by mouth 2 (two) times daily. Msm, Disp: , Rfl:    Multiple Vitamins-Minerals (MULTIVITAMIN ADULT PO), Take 1 tablet by mouth daily., Disp: , Rfl:    Omega-3 Fatty Acids (FISH OIL) 1000 MG CAPS, Take 1,000 mg by mouth daily., Disp: , Rfl:    ondansetron  (ZOFRAN ) 4 MG tablet, Take 1 tablet (4 mg total) by mouth every 6 (six) hours as needed for nausea., Disp: 20 tablet, Rfl: 0   OVER THE COUNTER MEDICATION, Place 2 drops under the tongue in the morning. [place], Disp: , Rfl:    Probiotic Product (PROBIOTIC PO), Take 1 capsule by mouth daily., Disp: , Rfl:    pyridOXINE  (VITAMIN B6) 100 MG tablet, Take 100 mg by mouth daily., Disp: , Rfl:    rosuvastatin  (CRESTOR ) 10 MG tablet, Take 1 tablet (10 mg total) by mouth daily., Disp: 90 tablet, Rfl: 3   timolol  (TIMOPTIC ) 0.5 % ophthalmic solution, Place 1 drop into both eyes 2 (two) times daily. , Disp: , Rfl:    venlafaxine  XR (EFFEXOR -XR) 75 MG 24 hr capsule, Take 1 capsule (75 mg total) by mouth daily., Disp: 90 capsule, Rfl: 3   Allergies  Allergen Reactions   Amoxicillin-Pot Clavulanate Anaphylaxis    Has patient had a PCN reaction causing immediate rash, facial/tongue/throat swelling, SOB or lightheadedness with hypotension:  Yes Has patient had a PCN reaction causing severe rash involving mucus membranes or skin necrosis: Yes Has patient had a PCN reaction that required hospitalization Yes Has patient had a PCN reaction occurring within the last 10 years: No If all of the above answers are NO, then may proceed with Cephalosporin use.   Augmentin [Amoxicillin-Pot Clavulanate] Anaphylaxis   Nickel Rash    Past Medical History:  Diagnosis Date   Complication of anesthesia    Depression    Glaucoma    Heart murmur    Hemorrhoids    History of diverticulitis 09/2015   HOCM (hypertrophic obstructive  cardiomyopathy) (HCC)    Hyperlipidemia    Hypertension    PONV (postoperative nausea and vomiting)    Pulmonary embolus (HCC) 1971   following surgery   Vitamin D deficiency      Past Surgical History:  Procedure Laterality Date   ABDOMINAL AORTIC ANEURYSM REPAIR     ANKLE SURGERY     BACK SURGERY     CARDIAC CATHETERIZATION     DG THUMB LEFT HAND     tendon   ICD IMPLANT N/A 10/03/2023   Procedure: ICD IMPLANT;  Surgeon: Kennyth Chew, MD;  Location[facility] INVASIVE CV LAB;  Service: Cardiovascular;  Laterality: N/A;   NASAL SEPTUM SURGERY     TOTAL KNEE ARTHROPLASTY Right 07/18/2017   Procedure: RIGHT TOTAL KNEE ARTHROPLASTY;  Surgeon: Melodi Lerner, MD;  Location: [facility];  Service: Orthopedics;  Laterality: Right;   TOTAL KNEE ARTHROPLASTY Left 02/28/2023   Procedure: TOTAL KNEE ARTHROPLASTY;  Surgeon: Melodi Lerner, MD;  Location: [facility];  Service: Orthopedics;  Laterality: Left;    Family History  Problem Relation Age of Onset   Dementia Mother    Colon cancer Father 58   Heart failure Father    Heart attack Father    Esophageal cancer Maternal Grandmother    Allergic rhinitis Neg Hx    Asthma Neg Hx    Eczema Neg Hx    Urticaria Neg Hx    Angioedema Neg Hx    Rectal cancer Neg Hx    Stomach cancer Neg Hx     Social History   Tobacco Use   Smoking status: Former    Current packs/day: 0.00    Types: Cigarettes    Quit date: 05/03/1980    Years since quitting: 43.7   Smokeless tobacco: Never   Tobacco comments:    a .50 a pack;   Vaping Use   Vaping status: Never Used  Substance Use Topics   Alcohol use: Yes    Comment: rarely   Drug use: No    ROS   Objective:   Vitals: BP 138/60 (BP Location: Left Arm)   Pulse 60   Temp 99.1 F (37.3 C) (Oral)   Resp 20   SpO2 94%   Physical Exam Constitutional:      General: She is not in acute distress.    Appearance: Normal appearance. She is well-developed. She is not ill-appearing, toxic-appearing or  diaphoretic.  HENT:     Head: Normocephalic and atraumatic.     Nose: Nose normal.     Mouth/Throat:     Mouth: Mucous membranes are moist.  Eyes:     General: No scleral icterus.       Right eye: No discharge.        Left eye: No discharge.     Extraocular Movements: Extraocular movements intact.  Cardiovascular:     Rate  and Rhythm: Normal rate and regular rhythm.     Heart sounds: Normal heart sounds. No murmur heard.    No friction rub. No gallop.  Pulmonary:     Effort: Pulmonary effort is normal. No respiratory distress.     Breath sounds: No stridor. No wheezing, rhonchi or rales.  Chest:     Chest wall: No tenderness.  Skin:    General: Skin is warm and dry.  Neurological:     General: No focal deficit present.     Mental Status: She is alert and oriented to person, place, and time.  Psychiatric:        Mood and Affect: Mood normal.        Behavior: Behavior normal.     Results for orders placed or performed during the hospital encounter of 01/13/24 (from the past 24 hours)  POC SARS Coronavirus 2 Ag     Status: Abnormal   Collection Time: 01/13/24  7:06 PM  Result Value Ref Range   SARS Coronavirus 2 Ag Positive (A) Negative    Assessment and Plan :   PDMP not reviewed this encounter.  1. COVID-19   2. Close exposure to COVID-19 virus    Offered Paxlovid , hold Crestor .  Deferred imaging given clear cardiopulmonary exam, hemodynamically stable vital signs.  Use supportive care otherwise.  Counseled patient on potential for adverse effects with medications prescribed/recommended today, ER and return-to-clinic precautions discussed, patient verbalized understanding.    Christopher Savannah, [place] 01/13/24 1914

## 2024-01-13 NOTE — ED Triage Notes (Signed)
 Pt reports dry cough, nasal congestion, headache, joint pain, body aches, sweating x 1 day. Tylenol  cold and flu  gives no relief.   Pt reports she was exposed to COVID 2 days ago.   Pt tested positive for COVID today

## 2024-01-13 NOTE — Patient Instructions (Signed)
 Medication Instructions:  Your physician recommends that you continue on your current medications as directed. Please refer to the Current Medication list given to you today.  *If you need a refill on your cardiac medications before your next appointment, please call your pharmacy*  Lab Work: None ordered.  If you have labs (blood work) drawn today and your tests are completely normal, you will receive your results only by: MyChart Message (if you have MyChart) OR A paper copy in the mail If you have any lab test that is abnormal or we need to change your treatment, we will call you to review the results.  Testing/Procedures: None ordered.   Follow-Up: At [facility], you and your health needs are our priority.  As part of our continuing mission to provide you with exceptional heart care, our providers are all part of one team.  This team includes your primary Cardiologist (physician) and Advanced Practice Providers or APPs (Physician Assistants and Nurse Practitioners) who all work together to provide you with the care you need, when you need it.  Your next appointment:   12 months with Dr Kennyth

## 2024-01-13 NOTE — Telephone Encounter (Signed)
 FYI Only or Action Required?: Action required by provider: Request for Paxlovid .  Patient was last seen in primary care on 10/12/2023 by Mercer Clotilda SAUNDERS, MD.  Called Nurse Triage reporting Covid Positive.  Symptoms began several days ago.  Interventions attempted: OTC medications: Advil, Tylenol  cold and flu.  Symptoms are: gradually worsening.  Triage Disposition: See HCP Within 4 Hours (Or PCP Triage)  Patient/caregiver understands and will follow disposition?: Yes     Copied from CRM [id number]. Topic: Clinical - Medication Question >> Jan 13, 2024  5:20 PM Drema MATSU wrote: Reason for CRM: Patient tested positive for COVID. Patient is requesting Paxlovid . Symptoms: runny nose, sore throat, severe cough, body aches, light headedness( a little unsteady on feet). Reason for Disposition  [1] Fever > 101 F (38.3 C) AND [2] age > 60 years  Answer Assessment - Initial Assessment Questions Test positive today. Taking Advil, and Tylenol  cold and flu. Patient referred to UC today for symptoms. Patient giving information for [facility]) due to inability to schedule an appointment today. Pt states she will walk in. Patient requesting Paxlovid  for symptoms.    1. COVID-19 DIAGNOSIS: How do you know that you have COVID? (e.g., positive lab test or self-test, diagnosed by doctor or NP/PA, symptoms after exposure).     At home test  2. COVID-19 EXPOSURE: Was there any known exposure to COVID before the symptoms began? CDC Definition of close contact: within 6 feet (2 meters) for a total of 15 minutes or more over a 24-hour period.      Yes, a friend tested positive  3. ONSET: When did the COVID-19 symptoms start?      Tuesday  4. WORST SYMPTOM: What is your worst symptom? (e.g., cough, fever, shortness of breath, muscle aches)     Fatigue  5. COUGH: Do you have a cough? If Yes, ask: How bad is the cough?       Severe and constant   6.  FEVER: Do you have a fever? If Yes, ask: What is your temperature, how was it measured, and when did it start?     Off and on fevers  7. RESPIRATORY STATUS: Describe your breathing? (e.g., normal; shortness of breath, wheezing, unable to speak)      Breathing is normal  8. OTHER SYMPTOMS: Do you have any other symptoms?  (e.g., chills, fatigue, headache, loss of smell or taste, muscle pain, sore throat)     Diarrhea, runny nose, sore throat, severe cough, body aches, light headedness, and a little unsteady  9. HIGH RISK DISEASE: Do you have any chronic medical problems? (e.g., asthma, heart or lung disease, weak immune system, obesity, etc.)       ICD pacemaker  Protocols used: Coronavirus (COVID-19) Diagnosed or Suspected-A-AH

## 2024-01-13 NOTE — Discharge Instructions (Addendum)
 For sore throat or cough try using a honey-based tea. Use 3 teaspoons of honey with juice squeezed from half lemon. Place shaved pieces of ginger into 1/2-1 cup of water  and warm over stove top. Then mix the ingredients and repeat every 4 hours as needed. Please take Tylenol  500mg -650mg  once every 6 hours for fevers, aches and pains. Hydrate very well with at least 2 liters (64 ounces) of water . Eat light meals such as soups (chicken and noodles, chicken wild rice, vegetable).  Do not eat any foods that you are allergic to.  Start an antihistamine like Zyrtec (10mg  daily) for postnasal drainage, sinus congestion.  Use cough syrup as needed.

## 2024-01-16 NOTE — Telephone Encounter (Signed)
 Patient was seen in UC on 9/12

## 2024-01-19 ENCOUNTER — Telehealth: Payer: Self-pay | Admitting: Cardiology

## 2024-01-19 NOTE — Telephone Encounter (Signed)
 Pt would like a c/b to go over ICD results.

## 2024-01-20 NOTE — Telephone Encounter (Signed)
 Returned call to Pt.  Pt was concerned that her in clinic check flagged an abnormal reading.  Reassured Pt that that was related to how our software programs interface and nothing to do with her device function.  Pt indicates understanding.

## 2024-01-22 ENCOUNTER — Ambulatory Visit: Payer: Self-pay | Admitting: Cardiology

## 2024-01-24 DIAGNOSIS — M9901 Segmental and somatic dysfunction of cervical region: Secondary | ICD-10-CM | POA: Diagnosis not present

## 2024-01-24 DIAGNOSIS — M5451 Vertebrogenic low back pain: Secondary | ICD-10-CM | POA: Diagnosis not present

## 2024-01-24 DIAGNOSIS — M5137 Other intervertebral disc degeneration, lumbosacral region with discogenic back pain only: Secondary | ICD-10-CM | POA: Diagnosis not present

## 2024-01-24 DIAGNOSIS — M542 Cervicalgia: Secondary | ICD-10-CM | POA: Diagnosis not present

## 2024-02-07 NOTE — Progress Notes (Signed)
 Remote ICD Transmission

## 2024-02-16 ENCOUNTER — Ambulatory Visit

## 2024-02-16 DIAGNOSIS — I422 Other hypertrophic cardiomyopathy: Secondary | ICD-10-CM | POA: Diagnosis not present

## 2024-02-17 LAB — CUP PACEART REMOTE DEVICE CHECK
Battery Remaining Longevity: 156 mo
Battery Remaining Percentage: 100 %
Brady Statistic RA Percent Paced: 3 %
Brady Statistic RV Percent Paced: 0 %
Date Time Interrogation Session: 20251016070700
HighPow Impedance: 69 Ohm
Implantable Lead Connection Status: 753985
Implantable Lead Connection Status: 753985
Implantable Lead Implant Date: 20250602
Implantable Lead Implant Date: 20250602
Implantable Lead Location: 753859
Implantable Lead Location: 753860
Implantable Lead Model: 672
Implantable Lead Model: 7841
Implantable Lead Serial Number: 1597713
Implantable Lead Serial Number: 271599
Implantable Pulse Generator Implant Date: 20250602
Lead Channel Impedance Value: 461 Ohm
Lead Channel Impedance Value: 593 Ohm
Lead Channel Pacing Threshold Amplitude: 0.6 V
Lead Channel Pacing Threshold Amplitude: 0.6 V
Lead Channel Pacing Threshold Pulse Width: 0.4 ms
Lead Channel Pacing Threshold Pulse Width: 0.4 ms
Lead Channel Setting Pacing Amplitude: 2.5 V
Lead Channel Setting Pacing Amplitude: 2.5 V
Lead Channel Setting Pacing Pulse Width: 0.4 ms
Lead Channel Setting Sensing Sensitivity: 0.5 mV
Pulse Gen Serial Number: 701814
Zone Setting Status: 755011

## 2024-02-23 NOTE — Progress Notes (Signed)
 Remote ICD Transmission

## 2024-02-24 DIAGNOSIS — Z96652 Presence of left artificial knee joint: Secondary | ICD-10-CM | POA: Diagnosis not present

## 2024-02-24 DIAGNOSIS — M7652 Patellar tendinitis, left knee: Secondary | ICD-10-CM | POA: Diagnosis not present

## 2024-02-25 ENCOUNTER — Ambulatory Visit: Payer: Self-pay | Admitting: Cardiology

## 2024-02-28 DIAGNOSIS — M9904 Segmental and somatic dysfunction of sacral region: Secondary | ICD-10-CM | POA: Diagnosis not present

## 2024-02-28 DIAGNOSIS — M9902 Segmental and somatic dysfunction of thoracic region: Secondary | ICD-10-CM | POA: Diagnosis not present

## 2024-02-28 DIAGNOSIS — M9906 Segmental and somatic dysfunction of lower extremity: Secondary | ICD-10-CM | POA: Diagnosis not present

## 2024-02-28 DIAGNOSIS — M9903 Segmental and somatic dysfunction of lumbar region: Secondary | ICD-10-CM | POA: Diagnosis not present

## 2024-02-28 DIAGNOSIS — M9905 Segmental and somatic dysfunction of pelvic region: Secondary | ICD-10-CM | POA: Diagnosis not present

## 2024-02-28 DIAGNOSIS — M9901 Segmental and somatic dysfunction of cervical region: Secondary | ICD-10-CM | POA: Diagnosis not present

## 2024-02-29 DIAGNOSIS — L720 Epidermal cyst: Secondary | ICD-10-CM | POA: Diagnosis not present

## 2024-02-29 DIAGNOSIS — L918 Other hypertrophic disorders of the skin: Secondary | ICD-10-CM | POA: Diagnosis not present

## 2024-02-29 DIAGNOSIS — L578 Other skin changes due to chronic exposure to nonionizing radiation: Secondary | ICD-10-CM | POA: Diagnosis not present

## 2024-02-29 DIAGNOSIS — L814 Other melanin hyperpigmentation: Secondary | ICD-10-CM | POA: Diagnosis not present

## 2024-02-29 DIAGNOSIS — L821 Other seborrheic keratosis: Secondary | ICD-10-CM | POA: Diagnosis not present

## 2024-02-29 DIAGNOSIS — D220 Melanocytic nevi of lip: Secondary | ICD-10-CM | POA: Diagnosis not present

## 2024-02-29 DIAGNOSIS — L853 Xerosis cutis: Secondary | ICD-10-CM | POA: Diagnosis not present

## 2024-02-29 DIAGNOSIS — B351 Tinea unguium: Secondary | ICD-10-CM | POA: Diagnosis not present

## 2024-02-29 DIAGNOSIS — L218 Other seborrheic dermatitis: Secondary | ICD-10-CM | POA: Diagnosis not present

## 2024-02-29 DIAGNOSIS — D225 Melanocytic nevi of trunk: Secondary | ICD-10-CM | POA: Diagnosis not present

## 2024-02-29 DIAGNOSIS — D1801 Hemangioma of skin and subcutaneous tissue: Secondary | ICD-10-CM | POA: Diagnosis not present

## 2024-03-27 DIAGNOSIS — M9905 Segmental and somatic dysfunction of pelvic region: Secondary | ICD-10-CM | POA: Diagnosis not present

## 2024-03-27 DIAGNOSIS — M9903 Segmental and somatic dysfunction of lumbar region: Secondary | ICD-10-CM | POA: Diagnosis not present

## 2024-03-27 DIAGNOSIS — M9901 Segmental and somatic dysfunction of cervical region: Secondary | ICD-10-CM | POA: Diagnosis not present

## 2024-03-27 DIAGNOSIS — M9904 Segmental and somatic dysfunction of sacral region: Secondary | ICD-10-CM | POA: Diagnosis not present

## 2024-03-27 DIAGNOSIS — M9902 Segmental and somatic dysfunction of thoracic region: Secondary | ICD-10-CM | POA: Diagnosis not present

## 2024-03-27 DIAGNOSIS — M9906 Segmental and somatic dysfunction of lower extremity: Secondary | ICD-10-CM | POA: Diagnosis not present

## 2024-05-17 ENCOUNTER — Ambulatory Visit

## 2024-05-17 DIAGNOSIS — I422 Other hypertrophic cardiomyopathy: Secondary | ICD-10-CM | POA: Diagnosis not present

## 2024-05-17 LAB — CUP PACEART REMOTE DEVICE CHECK
Battery Remaining Longevity: 156 mo
Battery Remaining Percentage: 100 %
Brady Statistic RA Percent Paced: 8 %
Brady Statistic RV Percent Paced: 0 %
Date Time Interrogation Session: 20260115042200
HighPow Impedance: 74 Ohm
Implantable Lead Connection Status: 753985
Implantable Lead Connection Status: 753985
Implantable Lead Implant Date: 20250602
Implantable Lead Implant Date: 20250602
Implantable Lead Location: 753859
Implantable Lead Location: 753860
Implantable Lead Model: 672
Implantable Lead Model: 7841
Implantable Lead Serial Number: 1597713
Implantable Lead Serial Number: 271599
Implantable Pulse Generator Implant Date: 20250602
Lead Channel Impedance Value: 454 Ohm
Lead Channel Impedance Value: 565 Ohm
Lead Channel Pacing Threshold Amplitude: 0.5 V
Lead Channel Pacing Threshold Amplitude: 0.6 V
Lead Channel Pacing Threshold Pulse Width: 0.4 ms
Lead Channel Pacing Threshold Pulse Width: 0.4 ms
Lead Channel Setting Pacing Amplitude: 2.5 V
Lead Channel Setting Pacing Amplitude: 2.5 V
Lead Channel Setting Pacing Pulse Width: 0.4 ms
Lead Channel Setting Sensing Sensitivity: 0.5 mV
Pulse Gen Serial Number: 701814
Zone Setting Status: 755011

## 2024-05-20 ENCOUNTER — Ambulatory Visit: Payer: Self-pay | Admitting: Cardiology

## 2024-05-25 NOTE — Progress Notes (Signed)
 Remote ICD Transmission

## 2024-05-29 ENCOUNTER — Ambulatory Visit: Admitting: Emergency Medicine

## 2024-06-19 ENCOUNTER — Ambulatory Visit: Admitting: Physician Assistant

## 2024-06-28 ENCOUNTER — Ambulatory Visit: Admitting: Physician Assistant

## 2024-08-16 ENCOUNTER — Encounter

## 2024-11-15 ENCOUNTER — Encounter

## 2025-02-14 ENCOUNTER — Encounter

## 2025-05-16 ENCOUNTER — Encounter

## 2025-08-15 ENCOUNTER — Encounter
